# Patient Record
Sex: Female | Born: 1935 | Race: Black or African American | Hispanic: No | Smoking: Never smoker
Health system: Southern US, Community
[De-identification: ages and names within clinical notes are randomized; demographics above are authoritative.]

## PROBLEM LIST (undated history)

## (undated) DIAGNOSIS — I10 Essential (primary) hypertension: Secondary | ICD-10-CM

## (undated) DIAGNOSIS — R0602 Shortness of breath: Secondary | ICD-10-CM

## (undated) DIAGNOSIS — I2510 Atherosclerotic heart disease of native coronary artery without angina pectoris: Secondary | ICD-10-CM

## (undated) DIAGNOSIS — Z951 Presence of aortocoronary bypass graft: Secondary | ICD-10-CM

## (undated) DIAGNOSIS — M21619 Bunion of unspecified foot: Secondary | ICD-10-CM

## (undated) DIAGNOSIS — R9439 Abnormal result of other cardiovascular function study: Secondary | ICD-10-CM

## (undated) DIAGNOSIS — M1990 Unspecified osteoarthritis, unspecified site: Secondary | ICD-10-CM

## (undated) DIAGNOSIS — K219 Gastro-esophageal reflux disease without esophagitis: Secondary | ICD-10-CM

## (undated) DIAGNOSIS — Z5189 Encounter for other specified aftercare: Secondary | ICD-10-CM

## (undated) DIAGNOSIS — F419 Anxiety disorder, unspecified: Secondary | ICD-10-CM

## (undated) DIAGNOSIS — IMO0001 Reserved for inherently not codable concepts without codable children: Secondary | ICD-10-CM

## (undated) DIAGNOSIS — G709 Myoneural disorder, unspecified: Secondary | ICD-10-CM

## (undated) DIAGNOSIS — E785 Hyperlipidemia, unspecified: Secondary | ICD-10-CM

## (undated) HISTORY — PX: BACK SURGERY: SHX140

## (undated) HISTORY — PX: TOE SURGERY: SHX1073

## (undated) HISTORY — PX: EYE SURGERY: SHX253

## (undated) HISTORY — PX: ABDOMINAL HYSTERECTOMY: SHX81

## (undated) HISTORY — DX: Bunion of unspecified foot: M21.619

## (undated) HISTORY — PX: TUBAL LIGATION: SHX77

## (undated) HISTORY — DX: Hyperlipidemia, unspecified: E78.5

---

## 2008-02-23 ENCOUNTER — Ambulatory Visit (HOSPITAL_COMMUNITY): Admission: RE | Admit: 2008-02-23 | Discharge: 2008-02-23 | Payer: Self-pay | Admitting: Gynecology

## 2009-03-26 ENCOUNTER — Ambulatory Visit (HOSPITAL_COMMUNITY): Admission: RE | Admit: 2009-03-26 | Discharge: 2009-03-26 | Payer: Self-pay | Admitting: Gynecology

## 2009-04-03 ENCOUNTER — Encounter: Admission: RE | Admit: 2009-04-03 | Discharge: 2009-04-03 | Payer: Self-pay | Admitting: Gynecology

## 2009-09-13 ENCOUNTER — Encounter: Admission: RE | Admit: 2009-09-13 | Discharge: 2009-09-13 | Payer: Self-pay | Admitting: Gynecology

## 2010-05-09 ENCOUNTER — Other Ambulatory Visit: Payer: Self-pay | Admitting: Gynecology

## 2010-05-09 DIAGNOSIS — Z1231 Encounter for screening mammogram for malignant neoplasm of breast: Secondary | ICD-10-CM

## 2010-06-03 ENCOUNTER — Ambulatory Visit: Payer: Self-pay

## 2010-06-10 ENCOUNTER — Ambulatory Visit
Admission: RE | Admit: 2010-06-10 | Discharge: 2010-06-10 | Disposition: A | Discharge status: Home / Self Care | Payer: BC Managed Care – PPO | Source: Ambulatory Visit | Attending: Gynecology | Admitting: Gynecology

## 2010-06-10 DIAGNOSIS — Z1231 Encounter for screening mammogram for malignant neoplasm of breast: Secondary | ICD-10-CM

## 2010-06-11 ENCOUNTER — Other Ambulatory Visit: Payer: Self-pay | Admitting: Gynecology

## 2010-06-11 DIAGNOSIS — R928 Other abnormal and inconclusive findings on diagnostic imaging of breast: Secondary | ICD-10-CM

## 2010-06-18 ENCOUNTER — Other Ambulatory Visit: Payer: Self-pay | Admitting: Gynecology

## 2010-06-18 ENCOUNTER — Ambulatory Visit
Admission: RE | Admit: 2010-06-18 | Discharge: 2010-06-18 | Disposition: A | Discharge status: Home / Self Care | Payer: Medicare Other | Source: Ambulatory Visit | Attending: Gynecology | Admitting: Gynecology

## 2010-06-18 ENCOUNTER — Ambulatory Visit
Admission: RE | Admit: 2010-06-18 | Discharge: 2010-06-18 | Disposition: A | Discharge status: Home / Self Care | Payer: BC Managed Care – PPO | Source: Ambulatory Visit | Attending: Gynecology | Admitting: Gynecology

## 2010-06-18 DIAGNOSIS — R928 Other abnormal and inconclusive findings on diagnostic imaging of breast: Secondary | ICD-10-CM

## 2010-06-18 HISTORY — PX: BREAST CYST ASPIRATION: SHX578

## 2010-07-30 ENCOUNTER — Other Ambulatory Visit: Payer: Self-pay | Admitting: Internal Medicine

## 2010-07-30 DIAGNOSIS — R1011 Right upper quadrant pain: Secondary | ICD-10-CM

## 2010-08-01 ENCOUNTER — Ambulatory Visit
Admission: RE | Admit: 2010-08-01 | Discharge: 2010-08-01 | Disposition: A | Discharge status: Home / Self Care | Payer: BC Managed Care – PPO | Source: Ambulatory Visit | Attending: Internal Medicine | Admitting: Internal Medicine

## 2010-08-01 DIAGNOSIS — R1011 Right upper quadrant pain: Secondary | ICD-10-CM

## 2010-10-15 ENCOUNTER — Other Ambulatory Visit: Payer: Self-pay | Admitting: Internal Medicine

## 2010-10-15 DIAGNOSIS — M545 Low back pain, unspecified: Secondary | ICD-10-CM

## 2010-10-22 ENCOUNTER — Ambulatory Visit
Admission: RE | Admit: 2010-10-22 | Discharge: 2010-10-22 | Disposition: A | Discharge status: Home / Self Care | Payer: Medicare Other | Source: Ambulatory Visit | Attending: Internal Medicine | Admitting: Internal Medicine

## 2010-10-22 DIAGNOSIS — M545 Low back pain, unspecified: Secondary | ICD-10-CM

## 2010-10-28 ENCOUNTER — Other Ambulatory Visit: Payer: Self-pay | Admitting: Internal Medicine

## 2010-10-28 DIAGNOSIS — M549 Dorsalgia, unspecified: Secondary | ICD-10-CM

## 2010-10-31 ENCOUNTER — Ambulatory Visit
Admission: RE | Admit: 2010-10-31 | Discharge: 2010-10-31 | Disposition: A | Discharge status: Home / Self Care | Payer: BC Managed Care – PPO | Source: Ambulatory Visit | Attending: Internal Medicine | Admitting: Internal Medicine

## 2010-10-31 DIAGNOSIS — M549 Dorsalgia, unspecified: Secondary | ICD-10-CM

## 2011-02-06 ENCOUNTER — Other Ambulatory Visit: Payer: Self-pay | Admitting: Neurosurgery

## 2011-02-17 HISTORY — PX: FOOT SURGERY: SHX648

## 2011-02-24 ENCOUNTER — Encounter (HOSPITAL_COMMUNITY): Payer: Self-pay | Admitting: Pharmacy Technician

## 2011-02-25 ENCOUNTER — Encounter (HOSPITAL_COMMUNITY): Payer: Self-pay

## 2011-02-25 ENCOUNTER — Encounter (HOSPITAL_COMMUNITY)
Admission: RE | Admit: 2011-02-25 | Discharge: 2011-02-25 | Disposition: A | Discharge status: Home / Self Care | Payer: BC Managed Care – PPO | Source: Ambulatory Visit | Attending: Neurosurgery | Admitting: Neurosurgery

## 2011-02-25 ENCOUNTER — Encounter (HOSPITAL_COMMUNITY)
Admission: RE | Admit: 2011-02-25 | Discharge: 2011-02-25 | Disposition: A | Discharge status: Home / Self Care | Payer: BC Managed Care – PPO | Source: Ambulatory Visit | Attending: Anesthesiology | Admitting: Anesthesiology

## 2011-02-25 HISTORY — DX: Encounter for other specified aftercare: Z51.89

## 2011-02-25 HISTORY — DX: Unspecified osteoarthritis, unspecified site: M19.90

## 2011-02-25 HISTORY — DX: Essential (primary) hypertension: I10

## 2011-02-25 HISTORY — DX: Shortness of breath: R06.02

## 2011-02-25 HISTORY — DX: Myoneural disorder, unspecified: G70.9

## 2011-02-25 HISTORY — DX: Anxiety disorder, unspecified: F41.9

## 2011-02-25 HISTORY — DX: Reserved for inherently not codable concepts without codable children: IMO0001

## 2011-02-25 HISTORY — DX: Gastro-esophageal reflux disease without esophagitis: K21.9

## 2011-02-25 LAB — CBC
HCT: 38.9 % (ref 36.0–46.0)
Hemoglobin: 13.4 g/dL (ref 12.0–15.0)
MCH: 30.7 pg (ref 26.0–34.0)
MCHC: 34.4 g/dL (ref 30.0–36.0)
MCV: 89 fL (ref 78.0–100.0)
Platelets: 284 10*3/uL (ref 150–400)
RBC: 4.37 MIL/uL (ref 3.87–5.11)
RDW: 14.7 % (ref 11.5–15.5)
WBC: 6.5 10*3/uL (ref 4.0–10.5)

## 2011-02-25 LAB — SURGICAL PCR SCREEN
MRSA, PCR: NEGATIVE
Staphylococcus aureus: NEGATIVE

## 2011-02-25 LAB — BASIC METABOLIC PANEL
BUN: 17 mg/dL (ref 6–23)
CO2: 28 mEq/L (ref 19–32)
Calcium: 10.3 mg/dL (ref 8.4–10.5)
Chloride: 98 mEq/L (ref 96–112)
Creatinine, Ser: 0.82 mg/dL (ref 0.50–1.10)
GFR calc Af Amer: 79 mL/min — ABNORMAL LOW (ref >90)
GFR calc non Af Amer: 68 mL/min — ABNORMAL LOW (ref >90)
Glucose, Bld: 89 mg/dL (ref 70–99)
Potassium: 3.7 mEq/L (ref 3.5–5.1)
Sodium: 139 mEq/L (ref 135–145)

## 2011-02-25 NOTE — Progress Notes (Signed)
Requested via fax, ekg & ov note fr. [facility]. [facility].

## 2011-02-25 NOTE — Pre-Procedure Instructions (Signed)
22 Theresa Crosby  02/25/2011   Your procedure is scheduled on:  03/04/2011  Report to [facility] at 6:30 AM.  Call this number if you have problems the morning of surgery: [phone]   Remember:   Do not eat food:After Midnight.  May have clear liquids: up to 4 Hours before arrival.  Clear liquids include soda, tea, black coffee, apple or grape juice, broth.  Take these medicines the morning of surgery with A SIP OF WATER: norvasc,prilosec, use eye drops   Do not wear jewelry, make-up or nail polish.  Do not wear lotions, powders, or perfumes. You may wear deodorant.  Do not shave 48 hours prior to surgery.  Do not bring valuables to the hospital.  Contacts, dentures or bridgework may not be worn into surgery.  Leave suitcase in the car. After surgery it may be brought to your room.  For patients admitted to the hospital, checkout time is 11:00 AM the day of discharge.   Patients discharged the day of surgery will not be allowed to drive home.  Name and phone number of your driver:   Special Instructions: CHG Shower Use Special Wash: 1/2 bottle night before surgery and 1/2 bottle morning of surgery.   Please read over the following fact sheets that you were given: Pain Booklet, Coughing and Deep Breathing, MRSA Information and Surgical Site Infection Prevention

## 2011-03-03 MED ORDER — CEFAZOLIN SODIUM 1-5 GM-% IV SOLN
1.0000 g | INTRAVENOUS | Status: AC
  Administered 2011-03-04: 1 g via INTRAVENOUS
  Filled 2011-03-03 (×2): qty 50

## 2011-03-04 ENCOUNTER — Encounter (HOSPITAL_COMMUNITY): Payer: Self-pay | Admitting: Certified Registered"

## 2011-03-04 ENCOUNTER — Inpatient Hospital Stay (HOSPITAL_COMMUNITY)
Admission: RE | Admit: 2011-03-04 | Discharge: 2011-03-05 | DRG: 758 | Disposition: A | Discharge status: Home / Self Care | Payer: BC Managed Care – PPO | Source: Ambulatory Visit | Attending: Neurosurgery | Admitting: Neurosurgery

## 2011-03-04 ENCOUNTER — Ambulatory Visit (HOSPITAL_COMMUNITY): Payer: BC Managed Care – PPO | Admitting: Certified Registered"

## 2011-03-04 ENCOUNTER — Ambulatory Visit (HOSPITAL_COMMUNITY): Payer: BC Managed Care – PPO

## 2011-03-04 ENCOUNTER — Encounter (HOSPITAL_COMMUNITY)
Admission: RE | Disposition: A | Discharge status: Home / Self Care | Payer: Self-pay | Source: Ambulatory Visit | Attending: Neurosurgery

## 2011-03-04 ENCOUNTER — Encounter (HOSPITAL_COMMUNITY): Payer: Self-pay | Admitting: *Deleted

## 2011-03-04 DIAGNOSIS — M1990 Unspecified osteoarthritis, unspecified site: Secondary | ICD-10-CM | POA: Diagnosis present

## 2011-03-04 DIAGNOSIS — K219 Gastro-esophageal reflux disease without esophagitis: Secondary | ICD-10-CM | POA: Diagnosis present

## 2011-03-04 DIAGNOSIS — Z01818 Encounter for other preprocedural examination: Secondary | ICD-10-CM

## 2011-03-04 DIAGNOSIS — I10 Essential (primary) hypertension: Secondary | ICD-10-CM | POA: Diagnosis present

## 2011-03-04 DIAGNOSIS — Z79899 Other long term (current) drug therapy: Secondary | ICD-10-CM

## 2011-03-04 DIAGNOSIS — IMO0002 Reserved for concepts with insufficient information to code with codable children: Secondary | ICD-10-CM | POA: Diagnosis present

## 2011-03-04 DIAGNOSIS — M4714 Other spondylosis with myelopathy, thoracic region: Principal | ICD-10-CM | POA: Diagnosis present

## 2011-03-04 DIAGNOSIS — F411 Generalized anxiety disorder: Secondary | ICD-10-CM | POA: Diagnosis present

## 2011-03-04 DIAGNOSIS — Z01812 Encounter for preprocedural laboratory examination: Secondary | ICD-10-CM

## 2011-03-04 HISTORY — PX: LUMBAR LAMINECTOMY/DECOMPRESSION MICRODISCECTOMY: SHX5026

## 2011-03-04 SURGERY — LUMBAR LAMINECTOMY/DECOMPRESSION MICRODISCECTOMY
Anesthesia: General | Site: Spine Thoracic | Wound class: Clean

## 2011-03-04 MED ORDER — HEMOSTATIC AGENTS (NO CHARGE) OPTIME
TOPICAL | Status: DC | PRN
  Administered 2011-03-04: 1 via TOPICAL

## 2011-03-04 MED ORDER — HYDROMORPHONE HCL PF 1 MG/ML IJ SOLN
0.2500 mg | INTRAMUSCULAR | Status: DC | PRN
  Administered 2011-03-04: 0.25 mg via INTRAVENOUS

## 2011-03-04 MED ORDER — DROPERIDOL 2.5 MG/ML IJ SOLN: 0.6250 mg | INTRAMUSCULAR | Status: DC | PRN

## 2011-03-04 MED ORDER — BACITRACIN 50000 UNITS IM SOLR
INTRAMUSCULAR | Status: AC
  Filled 2011-03-04: qty 1

## 2011-03-04 MED ORDER — ZOLPIDEM TARTRATE 5 MG PO TABS: 5.0000 mg | ORAL_TABLET | Freq: Every evening | ORAL | Status: DC | PRN

## 2011-03-04 MED ORDER — ONDANSETRON HCL 4 MG/2ML IJ SOLN
INTRAMUSCULAR | Status: DC | PRN
  Administered 2011-03-04: 4 mg via INTRAVENOUS

## 2011-03-04 MED ORDER — BUPIVACAINE HCL (PF) 0.5 % IJ SOLN
INTRAMUSCULAR | Status: DC | PRN
  Administered 2011-03-04: 20 mL

## 2011-03-04 MED ORDER — LIDOCAINE-EPINEPHRINE 1 %-1:100000 IJ SOLN
INTRAMUSCULAR | Status: DC | PRN
  Administered 2011-03-04: 20 mL

## 2011-03-04 MED ORDER — SODIUM CHLORIDE 0.9 % IR SOLN
Status: DC | PRN
  Administered 2011-03-04: 10:00:00

## 2011-03-04 MED ORDER — KCL IN DEXTROSE-NACL 20-5-0.45 MEQ/L-%-% IV SOLN
INTRAVENOUS | Status: DC
  Administered 2011-03-04: 14:00:00 via INTRAVENOUS
  Filled 2011-03-04 (×6): qty 1000

## 2011-03-04 MED ORDER — ACETAMINOPHEN 325 MG PO TABS: 650.0000 mg | ORAL_TABLET | ORAL | Status: DC | PRN

## 2011-03-04 MED ORDER — MENTHOL 3 MG MT LOZG: 1.0000 | LOZENGE | OROMUCOSAL | Status: DC | PRN

## 2011-03-04 MED ORDER — PHENOL 1.4 % MT LIQD: 1.0000 | OROMUCOSAL | Status: DC | PRN

## 2011-03-04 MED ORDER — PROPOFOL 10 MG/ML IV EMUL
INTRAVENOUS | Status: DC | PRN
  Administered 2011-03-04: 150 mg via INTRAVENOUS

## 2011-03-04 MED ORDER — MORPHINE SULFATE 4 MG/ML IJ SOLN: 4.0000 mg | INTRAMUSCULAR | Status: DC | PRN

## 2011-03-04 MED ORDER — SODIUM CHLORIDE 0.9 % IJ SOLN
3.0000 mL | Freq: Two times a day (BID) | INTRAMUSCULAR | Status: DC
  Administered 2011-03-04 – 2011-03-05 (×4): 3 mL via INTRAVENOUS

## 2011-03-04 MED ORDER — BRIMONIDINE TARTRATE 0.2 % OP SOLN
1.0000 [drp] | Freq: Two times a day (BID) | OPHTHALMIC | Status: DC
  Filled 2011-03-04: qty 5

## 2011-03-04 MED ORDER — SODIUM CHLORIDE 0.9 % IJ SOLN: 3.0000 mL | INTRAMUSCULAR | Status: DC | PRN

## 2011-03-04 MED ORDER — THROMBIN 5000 UNITS EX SOLR
CUTANEOUS | Status: DC | PRN
  Administered 2011-03-04 (×2): 5000 [IU] via TOPICAL

## 2011-03-04 MED ORDER — OLMESARTAN MEDOXOMIL 40 MG PO TABS
40.0000 mg | ORAL_TABLET | Freq: Every day | ORAL | Status: DC
  Administered 2011-03-04: 40 mg via ORAL
  Filled 2011-03-04 (×2): qty 1

## 2011-03-04 MED ORDER — KETOROLAC TROMETHAMINE 30 MG/ML IJ SOLN
INTRAMUSCULAR | Status: AC
  Administered 2011-03-04: 30 mg via INTRAVENOUS
  Filled 2011-03-04: qty 1

## 2011-03-04 MED ORDER — BISACODYL 10 MG RE SUPP: 10.0000 mg | Freq: Every day | RECTAL | Status: DC | PRN

## 2011-03-04 MED ORDER — CYCLOBENZAPRINE HCL 10 MG PO TABS: 10.0000 mg | ORAL_TABLET | Freq: Three times a day (TID) | ORAL | Status: DC | PRN

## 2011-03-04 MED ORDER — ROCURONIUM BROMIDE 100 MG/10ML IV SOLN
INTRAVENOUS | Status: DC | PRN
  Administered 2011-03-04: 50 mg via INTRAVENOUS

## 2011-03-04 MED ORDER — HYDROXYZINE HCL 50 MG/ML IM SOLN: 50.0000 mg | INTRAMUSCULAR | Status: DC | PRN

## 2011-03-04 MED ORDER — SODIUM CHLORIDE 0.9 % IV SOLN
INTRAVENOUS | Status: AC
  Filled 2011-03-04: qty 500

## 2011-03-04 MED ORDER — LACTATED RINGERS IV SOLN
INTRAVENOUS | Status: DC | PRN
  Administered 2011-03-04 (×2): via INTRAVENOUS

## 2011-03-04 MED ORDER — VECURONIUM BROMIDE 10 MG IV SOLR
INTRAVENOUS | Status: DC | PRN
  Administered 2011-03-04 (×2): 2 mg via INTRAVENOUS

## 2011-03-04 MED ORDER — HYDROCODONE-ACETAMINOPHEN 5-325 MG PO TABS
1.0000 | ORAL_TABLET | ORAL | Status: DC | PRN
  Administered 2011-03-04: 1 via ORAL
  Administered 2011-03-04: 2 via ORAL
  Administered 2011-03-04: 1 via ORAL
  Administered 2011-03-05 (×2): 2 via ORAL
  Filled 2011-03-04: qty 1
  Filled 2011-03-04 (×4): qty 2

## 2011-03-04 MED ORDER — HYDROMORPHONE HCL PF 1 MG/ML IJ SOLN
INTRAMUSCULAR | Status: AC
  Filled 2011-03-04: qty 1

## 2011-03-04 MED ORDER — FENTANYL CITRATE 0.05 MG/ML IJ SOLN
INTRAMUSCULAR | Status: DC | PRN
  Administered 2011-03-04: 50 ug via INTRAVENOUS
  Administered 2011-03-04: 25 ug via INTRAVENOUS
  Administered 2011-03-04 (×2): 50 ug via INTRAVENOUS

## 2011-03-04 MED ORDER — OXYCODONE-ACETAMINOPHEN 5-325 MG PO TABS: 1.0000 | ORAL_TABLET | ORAL | Status: DC | PRN

## 2011-03-04 MED ORDER — PHENYLEPHRINE HCL 10 MG/ML IJ SOLN
INTRAMUSCULAR | Status: DC | PRN
  Administered 2011-03-04 (×4): 40 ug via INTRAVENOUS

## 2011-03-04 MED ORDER — ACETAMINOPHEN 650 MG RE SUPP: 650.0000 mg | RECTAL | Status: DC | PRN

## 2011-03-04 MED ORDER — OLMESARTAN MEDOXOMIL-HCTZ 40-25 MG PO TABS: 1.0000 | ORAL_TABLET | Freq: Every day | ORAL | Status: DC

## 2011-03-04 MED ORDER — GLYCOPYRROLATE 0.2 MG/ML IJ SOLN
INTRAMUSCULAR | Status: DC | PRN
  Administered 2011-03-04 (×2): 0.2 mg via INTRAVENOUS

## 2011-03-04 MED ORDER — HYDROCHLOROTHIAZIDE 25 MG PO TABS
25.0000 mg | ORAL_TABLET | Freq: Every day | ORAL | Status: DC
  Administered 2011-03-04: 25 mg via ORAL
  Filled 2011-03-04 (×2): qty 1

## 2011-03-04 MED ORDER — AMLODIPINE BESYLATE 5 MG PO TABS
5.0000 mg | ORAL_TABLET | Freq: Every day | ORAL | Status: DC
  Filled 2011-03-04 (×2): qty 1

## 2011-03-04 MED ORDER — 0.9 % SODIUM CHLORIDE (POUR BTL) OPTIME
TOPICAL | Status: DC | PRN
  Administered 2011-03-04: 1000 mL

## 2011-03-04 MED ORDER — ESTROGENS, CONJUGATED 0.625 MG/GM VA CREA
0.5000 | TOPICAL_CREAM | VAGINAL | Status: DC
  Filled 2011-03-04: qty 42.5

## 2011-03-04 MED ORDER — DEXAMETHASONE SODIUM PHOSPHATE 4 MG/ML IJ SOLN
INTRAMUSCULAR | Status: DC | PRN
  Administered 2011-03-04: 4 mg via INTRAVENOUS

## 2011-03-04 MED ORDER — KETOROLAC TROMETHAMINE 30 MG/ML IJ SOLN
30.0000 mg | Freq: Once | INTRAMUSCULAR | Status: AC
  Administered 2011-03-04: 30 mg via INTRAVENOUS

## 2011-03-04 MED ORDER — KETOROLAC TROMETHAMINE 30 MG/ML IJ SOLN
30.0000 mg | Freq: Four times a day (QID) | INTRAMUSCULAR | Status: DC
  Administered 2011-03-04 – 2011-03-05 (×4): 30 mg via INTRAVENOUS
  Filled 2011-03-04 (×7): qty 1

## 2011-03-04 MED ORDER — ALUM & MAG HYDROXIDE-SIMETH 200-200-20 MG/5ML PO SUSP: 30.0000 mL | Freq: Four times a day (QID) | ORAL | Status: DC | PRN

## 2011-03-04 MED ORDER — LATANOPROST 0.005 % OP SOLN
1.0000 [drp] | Freq: Every day | OPHTHALMIC | Status: DC
  Filled 2011-03-04: qty 2.5

## 2011-03-04 MED ORDER — PHENYLEPHRINE HCL 10 MG/ML IJ SOLN
10.0000 mg | INTRAVENOUS | Status: DC | PRN
  Administered 2011-03-04: 50 ug/min via INTRAVENOUS
  Administered 2011-03-04: 10 ug/min via INTRAVENOUS

## 2011-03-04 MED ORDER — PANTOPRAZOLE SODIUM 40 MG PO TBEC: 40.0000 mg | DELAYED_RELEASE_TABLET | Freq: Every day | ORAL | Status: DC

## 2011-03-04 MED ORDER — NEOSTIGMINE METHYLSULFATE 1 MG/ML IJ SOLN
INTRAMUSCULAR | Status: DC | PRN
  Administered 2011-03-04: 3 mg via INTRAVENOUS

## 2011-03-04 MED ORDER — MIDAZOLAM HCL 5 MG/5ML IJ SOLN
INTRAMUSCULAR | Status: DC | PRN
  Administered 2011-03-04: 2 mg via INTRAVENOUS

## 2011-03-04 MED ORDER — ESTRADIOL 0.05 MG/24HR TD PTWK
0.0500 mg | MEDICATED_PATCH | TRANSDERMAL | Status: DC
  Filled 2011-03-04: qty 1

## 2011-03-04 MED ORDER — MAGNESIUM HYDROXIDE 400 MG/5ML PO SUSP: 30.0000 mL | Freq: Every day | ORAL | Status: DC | PRN

## 2011-03-04 MED ORDER — HYDROXYZINE HCL 25 MG PO TABS: 50.0000 mg | ORAL_TABLET | ORAL | Status: DC | PRN

## 2011-03-04 SURGICAL SUPPLY — 57 items
BAG DECANTER FOR FLEXI CONT (MISCELLANEOUS) ×2 IMPLANT
BENZOIN TINCTURE PRP APPL 2/3 (GAUZE/BANDAGES/DRESSINGS) IMPLANT
BLADE SURG ROTATE 9660 (MISCELLANEOUS) IMPLANT
BRUSH SCRUB EZ PLAIN DRY (MISCELLANEOUS) ×2 IMPLANT
BUR ACORN 6.0 ACORN (BURR) IMPLANT
BUR ACRON 5.0MM COATED (BURR) ×2 IMPLANT
BUR MATCHSTICK NEURO 3.0 LAGG (BURR) ×2 IMPLANT
CANISTER SUCTION 2500CC (MISCELLANEOUS) ×2 IMPLANT
CLOTH BEACON ORANGE TIMEOUT ST (SAFETY) ×2 IMPLANT
CONT SPEC 4OZ CLIKSEAL STRL BL (MISCELLANEOUS) ×2 IMPLANT
DERMABOND ADVANCED (GAUZE/BANDAGES/DRESSINGS) ×3
DERMABOND ADVANCED .7 DNX12 (GAUZE/BANDAGES/DRESSINGS) ×3 IMPLANT
DRAPE LAPAROTOMY 100X72X124 (DRAPES) ×2 IMPLANT
DRAPE MICROSCOPE LEICA (MISCELLANEOUS) IMPLANT
DRAPE POUCH INSTRU U-SHP 10X18 (DRAPES) ×2 IMPLANT
DRSG EMULSION OIL 3X3 NADH (GAUZE/BANDAGES/DRESSINGS) IMPLANT
ELECT CAUTERY BLADE 6.4 (BLADE) ×2 IMPLANT
ELECT REM PT RETURN 9FT ADLT (ELECTROSURGICAL) ×2
ELECTRODE REM PT RTRN 9FT ADLT (ELECTROSURGICAL) ×1 IMPLANT
GAUZE SPONGE 4X4 16PLY XRAY LF (GAUZE/BANDAGES/DRESSINGS) IMPLANT
GLOVE BIOGEL PI IND STRL 8 (GLOVE) ×2 IMPLANT
GLOVE BIOGEL PI INDICATOR 8 (GLOVE) ×2
GLOVE ECLIPSE 7.5 STRL STRAW (GLOVE) ×6 IMPLANT
GLOVE EXAM NITRILE LRG STRL (GLOVE) IMPLANT
GLOVE EXAM NITRILE MD LF STRL (GLOVE) ×2 IMPLANT
GLOVE EXAM NITRILE XL STR (GLOVE) IMPLANT
GLOVE EXAM NITRILE XS STR PU (GLOVE) IMPLANT
GOWN BRE IMP SLV AUR LG STRL (GOWN DISPOSABLE) ×2 IMPLANT
GOWN BRE IMP SLV AUR XL STRL (GOWN DISPOSABLE) ×2 IMPLANT
GOWN STRL REIN 2XL LVL4 (GOWN DISPOSABLE) ×2 IMPLANT
KIT BASIN OR (CUSTOM PROCEDURE TRAY) ×2 IMPLANT
KIT ROOM TURNOVER OR (KITS) ×2 IMPLANT
NEEDLE HYPO 18GX1.5 BLUNT FILL (NEEDLE) IMPLANT
NEEDLE HYPO 22GX1.5 SAFETY (NEEDLE) IMPLANT
NEEDLE SPNL 18GX3.5 QUINCKE PK (NEEDLE) ×4 IMPLANT
NEEDLE SPNL 22GX3.5 QUINCKE BK (NEEDLE) ×4 IMPLANT
NS IRRIG 1000ML POUR BTL (IV SOLUTION) ×2 IMPLANT
PACK LAMINECTOMY NEURO (CUSTOM PROCEDURE TRAY) ×2 IMPLANT
PAD ARMBOARD 7.5X6 YLW CONV (MISCELLANEOUS) ×12 IMPLANT
PATTIES SURGICAL .5 X1 (DISPOSABLE) IMPLANT
PENCIL BUTTON HOLSTER BLD 10FT (ELECTRODE) ×2 IMPLANT
RUBBERBAND STERILE (MISCELLANEOUS) IMPLANT
SPONGE GAUZE 4X4 12PLY (GAUZE/BANDAGES/DRESSINGS) ×2 IMPLANT
SPONGE LAP 4X18 X RAY DECT (DISPOSABLE) IMPLANT
SPONGE SURGIFOAM ABS GEL SZ50 (HEMOSTASIS) ×2 IMPLANT
STRIP CLOSURE SKIN 1/2X4 (GAUZE/BANDAGES/DRESSINGS) IMPLANT
SUT PROLENE 6 0 BV (SUTURE) IMPLANT
SUT VIC AB 1 CT1 18XBRD ANBCTR (SUTURE) ×2 IMPLANT
SUT VIC AB 1 CT1 8-18 (SUTURE) ×2
SUT VIC AB 2-0 CP2 18 (SUTURE) ×4 IMPLANT
SUT VIC AB 3-0 SH 8-18 (SUTURE) ×2 IMPLANT
SYR 20CC LL (SYRINGE) ×2 IMPLANT
SYR 5ML LL (SYRINGE) IMPLANT
TAPE CLOTH SURG 4X10 WHT LF (GAUZE/BANDAGES/DRESSINGS) ×2 IMPLANT
TOWEL OR 17X24 6PK STRL BLUE (TOWEL DISPOSABLE) ×2 IMPLANT
TOWEL OR 17X26 10 PK STRL BLUE (TOWEL DISPOSABLE) ×2 IMPLANT
WATER STERILE IRR 1000ML POUR (IV SOLUTION) ×2 IMPLANT

## 2011-03-04 NOTE — Transfer of Care (Signed)
Immediate Anesthesia Transfer of Care Note  Patient: Theresa Crosby  Procedure(s) Performed:  LUMBAR LAMINECTOMY/DECOMPRESSION MICRODISCECTOMY - Thoracic Ten-Thoracic Twelve Thoracic Laminectomy  Patient Location: PACU  Anesthesia Type: General  Level of Consciousness: awake  Airway & Oxygen Therapy: Patient Spontanous Breathing  Post-op Assessment: Report given to PACU RN  Post vital signs: stable Filed Vitals:   03/04/11 1030  BP:   Pulse:   Temp: 36.2 C  Resp:     Complications: No apparent anesthesia complications

## 2011-03-04 NOTE — Plan of Care (Signed)
Problem: Consults Goal: Diagnosis - Spinal Surgery Outcome: Completed/Met Date Met:  03/04/11 Lumbar Laminectomy (Complex)     

## 2011-03-04 NOTE — Anesthesia Preprocedure Evaluation (Addendum)
Anesthesia Evaluation  Patient identified by MRN, date of birth, ID band Patient awake    Reviewed: Allergy & Precautions, H&P , NPO status , Patient's Chart, lab work & pertinent test results  Airway Mallampati: II TM Distance: <3 FB Neck ROM: Full    Dental  (+) Partial Upper, Teeth Intact and Partial Lower   Pulmonary shortness of breath and with exertion,  clear to auscultation  Pulmonary exam normal       Cardiovascular hypertension, Pt. on medications - CAD Regular Normal- Systolic murmurs    Neuro/Psych Anxiety Depression  Neuromuscular disease    GI/Hepatic Neg liver ROS, GERD-  Medicated,  Endo/Other  Negative Endocrine ROS  Renal/GU negative Renal ROS     Musculoskeletal   Abdominal (+)  Abdomen: soft. Bowel sounds: normal.  Peds  Hematology   Anesthesia Other Findings   Reproductive/Obstetrics                         Anesthesia Physical Anesthesia Plan  ASA: III  Anesthesia Plan: General   Post-op Pain Management:    Induction: Intravenous  Airway Management Planned: Oral ETT  Additional Equipment:   Intra-op Plan:   Post-operative Plan: Extubation in OR  Informed Consent: I have reviewed the patients History and Physical, chart, labs and discussed the procedure including the risks, benefits and alternatives for the proposed anesthesia with the patient or authorized representative who has indicated his/her understanding and acceptance.   Dental advisory given  Plan Discussed with: CRNA, Anesthesiologist and Surgeon  Anesthesia Plan Comments:         Anesthesia Quick Evaluation

## 2011-03-04 NOTE — Progress Notes (Signed)
Filed Vitals:   03/04/11 1130 03/04/11 1145 03/04/11 1152 03/04/11 1200  BP: 142/71 122/69  142/75  Pulse: 53 51 56 69  Temp:   97 F (36.1 C) 97.4 F (36.3 C)  TempSrc:      Resp: 13 14 14 16   SpO2: 100% 100% 100% 98%     patient resting in bed comfortably. She has been out of bed to the bathroom and void without difficulty. Moving all extremities well. Dressing is clean and dry.   Plan: Have encouraged patient to be ambulating in the hall several times today, and again in the morning.

## 2011-03-04 NOTE — Progress Notes (Signed)
Subjective: Patient is a 78 y.o. female who is admitted for treatment of thoracic stenosis with myelopathy.  Patient's difficulties began in April 2012 which developed bladder incontinence, last summer she developed increasing difficulty with pain through her lower extremities, right worse than left. She developed a slowed gait and difficulty with control of her left lower extremity  Patient was evaluated with MRI scan that showed spondylosis, spinal disc protrusion, and canal stenosis at T11-T12 level, with increased signal within the spinal cord at T11-T12 level. Lumbar MRI showed multilevel multifactorial lumbar stenosis, moderate to marked at L2-3, marked to severe at L3-4.  Patient is admitted now for a T10-T12 thoracic laminectomy for decompression of thoracic stenosis.  Past Medical History  Diagnosis Date  . Hypertension     ireg. heartbeat, no referral made to cardiologist , sees [facility].   . Shortness of breath   . Blood transfusion     postop- back surg. 1987  . GERD (gastroesophageal reflux disease)   . Arthritis     degeneration - spondylosis, OA - all over body,   . Neuromuscular disorder     burning- L leg , numbing of toes   . Anxiety     2005- related to care of aging parent     Past Surgical History  Procedure Date  . Back surgery     1987-   . Eye surgery     cataracts removed- bilateral, no IOL  . Abdominal hysterectomy     partial hysterectomy- 1972  . Tubal ligation   . Toe surgery     R foot- removed calus between 5-4    Prescriptions prior to admission  Medication Sig Dispense Refill  . amLODipine (NORVASC) 5 MG tablet Take 5 mg by mouth daily.        . brimonidine (ALPHAGAN P) 0.1 % SOLN Place 1 drop into both eyes 2 (two) times daily.        . Calcium-Vitamin D-Vitamin K 500-100-40 MG-UNT-MCG CHEW Chew 1 tablet by mouth 2 (two) times daily.        . Cholecalciferol (VITAMIN D3) 1000 UNITS CAPS Take 2,000 Units by mouth every morning.        .  latanoprost (XALATAN) 0.005 % ophthalmic solution Place 1 drop into both eyes at bedtime.        . lidocaine (LIDODERM) 5 % Place 1 patch onto the skin daily as needed. Remove & Discard patch within 12 hours or as directed by MD for pain      . naproxen sodium (ANAPROX) 220 MG tablet Take 440 mg by mouth 2 (two) times daily with a meal. For pain      . olmesartan-hydrochlorothiazide (BENICAR HCT) 40-25 MG per tablet Take 1 tablet by mouth daily.        . Omeprazole (PRILOSEC PO) Take 1 capsule by mouth daily.        Marland Kitchen PRESCRIPTION MEDICATION Place 1 drop into both eyes 3 (three) times daily. fresh kote eye drops      . conjugated estrogens (PREMARIN) vaginal cream Place 0.5 g vaginally 2 (two) times a week. Thursday & sunday       . estradiol (VIVELLE-DOT) 0.0375 MG/24HR Place 1 patch onto the skin 2 (two) times a week. Thursday & Sunday        Allergies  Allergen Reactions  . Lactose Intolerance (Gi)     History  Substance Use Topics  . Smoking status: Never Smoker   . Smokeless tobacco:  Not on file  . Alcohol Use: No    Family History  Problem Relation Age of Onset  . Anesthesia problems Neg Hx   . Hypotension Neg Hx   . Malignant hyperthermia Neg Hx   . Pseudochol deficiency Neg Hx      Review of Systems A comprehensive review of systems was negative.  Objective: Vital signs in last 24 hours: Temp:  [98 F (36.7 C)] 98 F (36.7 C) (01/16 0651) Pulse Rate:  [83] 83  (01/16 0651) Resp:  [20] 20  (01/16 0651) BP: (122)/(80) 122/80 mmHg (01/16 0651) SpO2:  [100 %] 100 % (01/16 0651)  EXAM:  Lungs are clear to auscultation , the patient has symmetrical respiratory excursion. Heart has a regular rate and rhythm normal S1 and S2 no murmur.   Abdomen is soft nontender nondistended bowel sounds are present. Extremity examination shows no clubbing cyanosis or edema. Musculoskeletal examination shows some tenderness to palpation in the lower thoracic region, a dentist a patient over  the upper and mid thoracic region, no tenderness over the lumbar region. She had a flexion the low back to 90 able to extend to 10. Straight leg raising is negative bilaterally.  Motor examination shows 5 over 5 strength in the lower extremities including the iliopsoas quadriceps dorsiflexor extensor hallicus  longus and plantar flexor bilaterally. Sensation is intact to pinprick in the distal lower extremities. Reflexes biceps and brachialis or 2 bilaterally, left triceps is minimal, right triceps is 2, quadriceps 3 bilaterally, the question is are minimal bilaterally, the toes are equivocal bilaterally. Patient has a normal gait and stance.    Data Review:CBC    Component Value Date/Time   WBC 6.5 02/25/2011 1338   RBC 4.37 02/25/2011 1338   HGB 13.4 02/25/2011 1338   HCT 38.9 02/25/2011 1338   PLT 284 02/25/2011 1338   MCV 89.0 02/25/2011 1338   MCH 30.7 02/25/2011 1338   MCHC 34.4 02/25/2011 1338   RDW 14.7 02/25/2011 1338                          BMET    Component Value Date/Time   NA 139 02/25/2011 1338   K 3.7 02/25/2011 1338   CL 98 02/25/2011 1338   CO2 28 02/25/2011 1338   GLUCOSE 89 02/25/2011 1338   BUN 17 02/25/2011 1338   CREATININE 0.82 02/25/2011 1338   CALCIUM 10.3 02/25/2011 1338   GFRNONAA 68* 02/25/2011 1338   GFRAA 79* 02/25/2011 1338     Assessment/Plan: Patient with thoracic stenosis with myelopathy secondary to spondylosis and spondylitic disc protrusion. She is admitted now for T10-T12 thoracic laminectomy. I've discussed with the patient the nature of her condition, the nature of the surgical procedure, typical length of surgery, hospital stay, and overall recuperation. We discussed her limitations postoperatively and risks of surgery including risks of infection, bleeding, possible need for transfusion, the risk of nerve root dysfunction with pain, weakness, numbness, or paresthesias, risk of spinal cord dysfunction with paralysis of both lower extremities, and bowel and bladder function,  and anesthetic risks of infarction, stroke, pneumonia, and death.   Hewitt Shorts, MD 03/04/2011 8:15 AM

## 2011-03-04 NOTE — H&P (Signed)
Subjective: Patient is a 78 y.o. female who is admitted for treatment of thoracic stenosis with myelopathy.  Patient's difficulties began in April 2012 which developed bladder incontinence, last summer she developed increasing difficulty with pain through her lower extremities, right worse than left. She developed a slowed gait and difficulty with control of her left lower extremity  Patient was evaluated with MRI scan that showed spondylosis, spinal disc protrusion, and canal stenosis at T11-T12 level, with increased signal within the spinal cord at T11-T12 level. Lumbar MRI showed multilevel multifactorial lumbar stenosis, moderate to marked at L2-3, marked to severe at L3-4.  Patient is admitted now for a T10-T12 thoracic laminectomy for decompression of thoracic stenosis.  Past Medical History  Diagnosis Date  . Hypertension     ireg. heartbeat, no referral made to cardiologist , sees [facility]   . Shortness of breath   . Blood transfusion     postop- back surg. 1987  . GERD (gastroesophageal reflux disease)   . Arthritis     degeneration - spondylosis, OA - all over body,   . Neuromuscular disorder     burning- L leg , numbing of toes   . Anxiety     2005- related to care of aging parent     Past Surgical History  Procedure Date  . Back surgery     1987-   . Eye surgery     cataracts removed- bilateral, no IOL  . Abdominal hysterectomy     partial hysterectomy- 1972  . Tubal ligation   . Toe surgery     R foot- removed calus between 5-4    Prescriptions prior to admission  Medication Sig Dispense Refill  . amLODipine (NORVASC) 5 MG tablet Take 5 mg by mouth daily.        . brimonidine (ALPHAGAN P) 0.1 % SOLN Place 1 drop into both eyes 2 (two) times daily.        . Calcium-Vitamin D-Vitamin K 500-100-40 MG-UNT-MCG CHEW Chew 1 tablet by mouth 2 (two) times daily.        . Cholecalciferol (VITAMIN D3) 1000 UNITS CAPS Take 2,000 Units by mouth every morning.        .  latanoprost (XALATAN) 0.005 % ophthalmic solution Place 1 drop into both eyes at bedtime.        . lidocaine (LIDODERM) 5 % Place 1 patch onto the skin daily as needed. Remove & Discard patch within 12 hours or as directed by MD for pain      . naproxen sodium (ANAPROX) 220 MG tablet Take 440 mg by mouth 2 (two) times daily with a meal. For pain      . olmesartan-hydrochlorothiazide (BENICAR HCT) 40-25 MG per tablet Take 1 tablet by mouth daily.        . Omeprazole (PRILOSEC PO) Take 1 capsule by mouth daily.        . PRESCRIPTION MEDICATION Place 1 drop into both eyes 3 (three) times daily. fresh kote eye drops      . conjugated estrogens (PREMARIN) vaginal cream Place 0.5 g vaginally 2 (two) times a week. Thursday & sunday       . estradiol (VIVELLE-DOT) 0.0375 MG/24HR Place 1 patch onto the skin 2 (two) times a week. Thursday & Sunday        Allergies  Allergen Reactions  . Lactose Intolerance (Gi)     History  Substance Use Topics  . Smoking status: Never Smoker   . Smokeless tobacco:   Not on file  . Alcohol Use: No    Family History  Problem Relation Age of Onset  . Anesthesia problems Neg Hx   . Hypotension Neg Hx   . Malignant hyperthermia Neg Hx   . Pseudochol deficiency Neg Hx      Review of Systems A comprehensive review of systems was negative.  Objective: Vital signs in last 24 hours: Temp:  [98 F (36.7 C)] 98 F (36.7 C) (01/16 0651) Pulse Rate:  [83] 83  (01/16 0651) Resp:  [20] 20  (01/16 0651) BP: (122)/(80) 122/80 mmHg (01/16 0651) SpO2:  [100 %] 100 % (01/16 0651)  EXAM:  Lungs are clear to auscultation , the patient has symmetrical respiratory excursion. Heart has a regular rate and rhythm normal S1 and S2 no murmur.   Abdomen is soft nontender nondistended bowel sounds are present. Extremity examination shows no clubbing cyanosis or edema. Musculoskeletal examination shows some tenderness to palpation in the lower thoracic region, a dentist a patient over  the upper and mid thoracic region, no tenderness over the lumbar region. She had a flexion the low back to 90 able to extend to 10. Straight leg raising is negative bilaterally.  Motor examination shows 5 over 5 strength in the lower extremities including the iliopsoas quadriceps dorsiflexor extensor hallicus  longus and plantar flexor bilaterally. Sensation is intact to pinprick in the distal lower extremities. Reflexes biceps and brachialis or 2 bilaterally, left triceps is minimal, right triceps is 2, quadriceps 3 bilaterally, the question is are minimal bilaterally, the toes are equivocal bilaterally. Patient has a normal gait and stance.    Data Review:CBC    Component Value Date/Time   WBC 6.5 02/25/2011 1338   RBC 4.37 02/25/2011 1338   HGB 13.4 02/25/2011 1338   HCT 38.9 02/25/2011 1338   PLT 284 02/25/2011 1338   MCV 89.0 02/25/2011 1338   MCH 30.7 02/25/2011 1338   MCHC 34.4 02/25/2011 1338   RDW 14.7 02/25/2011 1338                          BMET    Component Value Date/Time   NA 139 02/25/2011 1338   K 3.7 02/25/2011 1338   CL 98 02/25/2011 1338   CO2 28 02/25/2011 1338   GLUCOSE 89 02/25/2011 1338   BUN 17 02/25/2011 1338   CREATININE 0.82 02/25/2011 1338   CALCIUM 10.3 02/25/2011 1338   GFRNONAA 68* 02/25/2011 1338   GFRAA 79* 02/25/2011 1338     Assessment/Plan: Patient with thoracic stenosis with myelopathy secondary to spondylosis and spondylitic disc protrusion. She is admitted now for T10-T12 thoracic laminectomy. I've discussed with the patient the nature of her condition, the nature of the surgical procedure, typical length of surgery, hospital stay, and overall recuperation. We discussed her limitations postoperatively and risks of surgery including risks of infection, bleeding, possible need for transfusion, the risk of nerve root dysfunction with pain, weakness, numbness, or paresthesias, risk of spinal cord dysfunction with paralysis of both lower extremities, and bowel and bladder function,  and anesthetic risks of infarction, stroke, pneumonia, and death.   NUDELMAN,ROBERT W, MD 03/04/2011 8:15 AM    NA  139  02/25/2011 1338     K  3.7  02/25/2011 1338     CL  98  02/25/2011 1338     CO2  28  02/25/2011 1338     GLUCOSE  89  02/25/2011 1338     BUN  17  02/25/2011 1338     CREATININE  0.82  02/25/2011 1338     CALCIUM  10.3  02/25/2011 1338     GFRNONAA  68*  02/25/2011 1338     GFRAA  79*  02/25/2011 1338        Assessment/Plan: Patient with thoracic stenosis with myelopathy secondary to spondylosis and spondylitic disc protrusion. She is admitted now for T10-T12 thoracic laminectomy. I've discussed with the patient the nature of her condition, the nature of the surgical procedure, typical length of surgery, hospital stay, and overall recuperation. We discussed her limitations postoperatively  and risks of surgery including risks of infection, bleeding, possible need for transfusion, the risk of nerve root dysfunction with pain, weakness, numbness, or paresthesias, risk of spinal cord dysfunction with paralysis of both lower extremities, and bowel and bladder function, and anesthetic risks of infarction, stroke, pneumonia, and death.     Hewitt Shorts, MD 03/04/2011 8:15 AM

## 2011-03-04 NOTE — Op Note (Signed)
03/04/2011  10:35 AM  PATIENT:  Theresa Crosby  78 y.o. female  PRE-OPERATIVE DIAGNOSIS:  thoracic stenosis, thoracic spondylosis with myelopathy, thoracic degenerative disc disease with myelopathy  POST-OPERATIVE DIAGNOSIS:   thoracic stenosis, thoracic spondylosis with myelopathy, thoracic degenerative disc disease with myelopathy   PROCEDURE:  Procedure(s): THORACIC LAMINECTOMY/DECOMPRESSION, T9-T12 with decompression of the central canal, lateral recesses, and exiting nerve roots  SURGEON:  Surgeon(s): Hewitt Shorts  ASSISTANTS: Marikay Alar  ANESTHESIA:   general  EBL:  Total I/O In: 1000 [I.V.:1000] Out: 50 [Blood:50]  BLOOD ADMINISTERED:none  COUNT: Correct per nursing staff  DRAINS: none   SPECIMEN:  No Specimen  DICTATION: Patient was brought to the operating room, placed under general endotracheal anesthesia. Patient was turned to a prone position. Thoracolumbar region was prepped with iodine soap and solution and draped in a sterile fashion. The midline was infiltrated with local anesthetic with epinephrine. An x-ray was taken and the T12 level localized. A midline incision was made over the lower thoracic spine. DIssection was carried down to subcutaneous tissue to the thoracolumbar fascia which was incised bilaterally. Paraspinal musculature was dissected from the spinous process lamina in a subperiosteal fashion. Another localizing x-ray was taken at T10, T11 and T12 spinous process and lamina were identified. Laminectomy was begun with double-action rongeurs and continued with the high-speed drill and Kerrison punches with thin footplate. The decompression was done using magnification and micro-dissection and microsurgical technique. At the T11-12 level there was marked stenosis, the ligamentum flavum was somewhat apparent to the dorsal aspect the dura was carefully dissected from the dura. There was also significant stenosis at the T10-11 level which was again  carefully decompressed. In the end the inferior aspect of T9 was removed as well in the T9-10 level was decompressed, with decompression of the exiting nerve roots at T9, T10, T11, and T12.  Hemostasis was established with waxing of the bone edges and the use of Gelfoam in the lateral gutters. The wound was irrigated extensively first with saline solution and then with bacitracin solution. The Gelfoam was removed and the wound irrigated again and hemostasis confirmed.  Another x-ray was taken with markers to identify the superior and inferior extent of our laminectomy and we confirmed decompression at the T9-10, T10-11, T11-12 levels.  We then proceeded with closure. Paraspinal muscles were approximated with interrupted undyed 1 Vicryl sutures. Deep fascia was closed with interrupted undyed 1 Vicryl sutures. Subcutaneous and subcuticular closed with interrupted inverted 2-0 undyed Vicryl sutures. Skin is approximate Dermabond. We'll was dressed with dressing sponges and 4 inch Hypafix.  Following surgery the patient was turned back to use a supine position to be reversed and the anesthetic extubated and transferred to the recovery room for further care   PLAN OF CARE: Admit to inpatient   PATIENT DISPOSITION:  PACU - hemodynamically stable.   Delay start of Pharmacological VTE agent (>24hrs) due to surgical blood loss or risk of bleeding:  yes

## 2011-03-04 NOTE — Anesthesia Postprocedure Evaluation (Signed)
Anesthesia Post Note  Patient: Theresa Crosby  Procedure(s) Performed:  LUMBAR LAMINECTOMY/DECOMPRESSION MICRODISCECTOMY - Thoracic Ten-Thoracic Twelve Thoracic Laminectomy  Anesthesia type: general  Patient location: PACU  Post pain: Pain level controlled  Post assessment: Patient's Cardiovascular Status Stable  Last Vitals:  Filed Vitals:   03/04/11 1152  BP:   Pulse: 56  Temp: 36.1 C  Resp: 14    Post vital signs: Reviewed and stable  Level of consciousness: sedated  Complications: No apparent anesthesia complications

## 2011-03-04 NOTE — Anesthesia Procedure Notes (Signed)
Procedure Name: Intubation Date/Time: 03/04/2011 8:41 AM Performed by: Ellin Goodie Pre-anesthesia Checklist: Patient identified, Emergency Drugs available, Suction available, Patient being monitored and Timeout performed Patient Re-evaluated:Patient Re-evaluated prior to inductionOxygen Delivery Method: Circle System Utilized Preoxygenation: Pre-oxygenation with 100% oxygen Intubation Type: IV induction Ventilation: Mask ventilation without difficulty Laryngoscope Size: Mac and 4 Grade View: Grade I Tube size: 7.5 mm Number of attempts: 1 Airway Equipment and Method: stylet Placement Confirmation: ETT inserted through vocal cords under direct vision,  positive ETCO2 and breath sounds checked- equal and bilateral Secured at: 22 cm Tube secured with: Tape Dental Injury: Teeth and Oropharynx as per pre-operative assessment

## 2011-03-05 ENCOUNTER — Encounter (HOSPITAL_COMMUNITY): Payer: Self-pay | Admitting: Neurosurgery

## 2011-03-05 MED ORDER — HYDROCODONE-ACETAMINOPHEN 5-325 MG PO TABS: 1.0000 | ORAL_TABLET | ORAL | Status: AC | PRN

## 2011-03-05 NOTE — Discharge Summary (Signed)
Physician Discharge Summary  Patient ID: Theresa Crosby MRN: [id number] DOB/AGE: [birth year] 78 y.o.  Admit date: 03/04/2011 Discharge date: 03/05/2011  Admission Diagnoses: Thoracic stenosis, thoracic spondylosis with myelopathy, thoracic degenerative disc disease with myelopathy  Discharge Diagnoses:  Thoracic stenosis, thoracic spondylosis with myelopathy, thoracic degenerative disc disease with myelopathy  Discharged Condition: good  Hospital Course: Patient was admitted underwent a T9-T12 decompressive thoracic laminectomy. She's done quite well following surgery. He is up and ambulate actively in the halls. Her wound is healing nicely. She's been given instructions regarding wound care and activities following discharge.  Discharge Exam: Blood pressure 105/71, pulse 66, temperature 97.5 F (36.4 C), temperature source Oral, resp. rate 20, SpO2 98.00%.  Disposition: Home   Current Discharge Medication List    START taking these medications   Details  HYDROcodone-acetaminophen (NORCO) 5-325 MG per tablet Take 1-2 tablets by mouth every 4 (four) hours as needed for pain. Qty: 50 tablet, Refills: 0      CONTINUE these medications which have NOT CHANGED   Details  amLODipine (NORVASC) 5 MG tablet Take 5 mg by mouth daily.      brimonidine (ALPHAGAN P) 0.1 % SOLN Place 1 drop into both eyes 2 (two) times daily.      Calcium-Vitamin D-Vitamin K 500-100-40 MG-UNT-MCG CHEW Chew 1 tablet by mouth 2 (two) times daily.      Cholecalciferol (VITAMIN D3) 1000 UNITS CAPS Take 2,000 Units by mouth every morning.      latanoprost (XALATAN) 0.005 % ophthalmic solution Place 1 drop into both eyes at bedtime.      lidocaine (LIDODERM) 5 % Place 1 patch onto the skin daily as needed. Remove & Discard patch within 12 hours or as directed by MD for pain    naproxen sodium (ANAPROX) 220 MG tablet Take 440 mg by mouth 2 (two) times daily with a meal. For pain      olmesartan-hydrochlorothiazide (BENICAR HCT) 40-25 MG per tablet Take 1 tablet by mouth daily.      Omeprazole (PRILOSEC PO) Take 1 capsule by mouth daily.      PRESCRIPTION MEDICATION Place 1 drop into both eyes 3 (three) times daily. fresh kote eye drops    conjugated estrogens (PREMARIN) vaginal cream Place 0.5 g vaginally 2 (two) times a week. Thursday & sunday     estradiol (VIVELLE-DOT) 0.0375 MG/24HR Place 1 patch onto the skin 2 (two) times a week. Thursday & Sunday          Signed: Hewitt Shorts, MD 03/05/2011, 10:19 AM

## 2011-03-05 NOTE — Progress Notes (Signed)
Utilization review completed. Reichen Hutzler, RN, BSN. 03/05/11  

## 2011-03-18 ENCOUNTER — Emergency Department (HOSPITAL_COMMUNITY): Payer: BC Managed Care – PPO

## 2011-03-18 ENCOUNTER — Emergency Department (HOSPITAL_COMMUNITY)
Admission: EM | Admit: 2011-03-18 | Discharge: 2011-03-18 | Disposition: A | Discharge status: Home / Self Care | Payer: BC Managed Care – PPO | Attending: Emergency Medicine | Admitting: Emergency Medicine

## 2011-03-18 ENCOUNTER — Encounter (HOSPITAL_COMMUNITY): Payer: Self-pay | Admitting: Emergency Medicine

## 2011-03-18 DIAGNOSIS — M1990 Unspecified osteoarthritis, unspecified site: Secondary | ICD-10-CM | POA: Insufficient documentation

## 2011-03-18 DIAGNOSIS — Z79899 Other long term (current) drug therapy: Secondary | ICD-10-CM | POA: Insufficient documentation

## 2011-03-18 DIAGNOSIS — F411 Generalized anxiety disorder: Secondary | ICD-10-CM | POA: Insufficient documentation

## 2011-03-18 DIAGNOSIS — I10 Essential (primary) hypertension: Secondary | ICD-10-CM | POA: Insufficient documentation

## 2011-03-18 DIAGNOSIS — R209 Unspecified disturbances of skin sensation: Secondary | ICD-10-CM | POA: Insufficient documentation

## 2011-03-18 DIAGNOSIS — K219 Gastro-esophageal reflux disease without esophagitis: Secondary | ICD-10-CM | POA: Insufficient documentation

## 2011-03-18 DIAGNOSIS — M549 Dorsalgia, unspecified: Secondary | ICD-10-CM | POA: Insufficient documentation

## 2011-03-18 LAB — DIFFERENTIAL
Basophils Absolute: 0.1 10*3/uL (ref 0.0–0.1)
Basophils Relative: 1 % (ref 0–1)
Eosinophils Absolute: 0.7 10*3/uL (ref 0.0–0.7)
Eosinophils Relative: 10 % — ABNORMAL HIGH (ref 0–5)
Lymphocytes Relative: 31 % (ref 12–46)
Lymphs Abs: 2.2 10*3/uL (ref 0.7–4.0)
Monocytes Absolute: 0.6 10*3/uL (ref 0.1–1.0)
Monocytes Relative: 8 % (ref 3–12)
Neutro Abs: 3.6 10*3/uL (ref 1.7–7.7)
Neutrophils Relative %: 51 % (ref 43–77)

## 2011-03-18 LAB — CBC
HCT: 32.7 % — ABNORMAL LOW (ref 36.0–46.0)
Hemoglobin: 11.3 g/dL — ABNORMAL LOW (ref 12.0–15.0)
MCH: 30.1 pg (ref 26.0–34.0)
MCHC: 34.6 g/dL (ref 30.0–36.0)
MCV: 87 fL (ref 78.0–100.0)
Platelets: 329 10*3/uL (ref 150–400)
RBC: 3.76 MIL/uL — ABNORMAL LOW (ref 3.87–5.11)
RDW: 14.7 % (ref 11.5–15.5)
WBC: 7.1 10*3/uL (ref 4.0–10.5)

## 2011-03-18 LAB — SEDIMENTATION RATE: Sed Rate: 41 mm/hr — ABNORMAL HIGH (ref 0–22)

## 2011-03-18 MED ORDER — HYDROMORPHONE HCL 2 MG PO TABS
2.0000 mg | ORAL_TABLET | Freq: Once | ORAL | Status: AC
  Administered 2011-03-18: 2 mg via ORAL
  Filled 2011-03-18: qty 1

## 2011-03-18 MED ORDER — HYDROMORPHONE HCL 2 MG PO TABS: 2.0000 mg | ORAL_TABLET | ORAL | Status: AC | PRN

## 2011-03-18 MED ORDER — GADOBENATE DIMEGLUMINE 529 MG/ML IV SOLN
10.0000 mL | Freq: Once | INTRAVENOUS | Status: AC | PRN
  Administered 2011-03-18: 10 mL via INTRAVENOUS

## 2011-03-18 NOTE — ED Provider Notes (Signed)
History     CSN: [id number]  Arrival date & time 03/18/11  Ernestina Columbia   First MD Initiated Contact with Patient 03/18/11 1939      Chief Complaint  Patient presents with  . Back Pain    (Consider location/radiation/quality/duration/timing/severity/associated sxs/prior treatment) Patient is a 78 y.o. female presenting with back pain. The history is provided by the patient and medical records.  Back Pain  Associated symptoms include numbness. Pertinent negatives include no chest pain, no fever, no headaches, no abdominal pain and no weakness.   the patient is a 78 year old female, with a history of hypertension, who presents with back pain since last night.  She has a T10-T12 thoracic laminectomy on January 16 of this year by Dr. Jule Ser.  She did not fall or injure herself.  She denies fevers, chills, nausea, vomiting.  She denies weakness in her legs.  She complains of mid pain, and numbness in her thighs and in her feet.  She denies bowel or bladder incontinence.  Past Medical History  Diagnosis Date  . Hypertension     ireg. heartbeat, no referral made to cardiologist , sees [facility]. Med.   . Shortness of breath   . Blood transfusion     postop- back surg. 1987  . GERD (gastroesophageal reflux disease)   . Arthritis     degeneration - spondylosis, OA - all over body,   . Neuromuscular disorder     burning- L leg , numbing of toes   . Anxiety     2005- related to care of aging parent     Past Surgical History  Procedure Date  . Back surgery     1987-   . Eye surgery     cataracts removed- bilateral, no IOL  . Abdominal hysterectomy     partial hysterectomy- 1972  . Tubal ligation   . Toe surgery     R foot- removed calus between 5-4  . Lumbar laminectomy/decompression microdiscectomy 03/04/2011    Procedure: LUMBAR LAMINECTOMY/DECOMPRESSION MICRODISCECTOMY;  Surgeon: Hewitt Shorts, MD;  Location: [facility];  Service: Neurosurgery;  Laterality: N/A;  Thoracic Ten-Thoracic  Twelve Thoracic Laminectomy    Family History  Problem Relation Age of Onset  . Anesthesia problems Neg Hx   . Hypotension Neg Hx   . Malignant hyperthermia Neg Hx   . Pseudochol deficiency Neg Hx     History  Substance Use Topics  . Smoking status: Never Smoker   . Smokeless tobacco: Not on file  . Alcohol Use: No    OB History    Grav Para Term Preterm Abortions TAB SAB Ect Mult Living                  Review of Systems  Constitutional: Negative for fever and chills.  HENT: Negative for neck pain.   Cardiovascular: Negative for chest pain.  Gastrointestinal: Negative for nausea, vomiting and abdominal pain.  Musculoskeletal: Positive for back pain.  Skin: Negative for wound.  Neurological: Positive for numbness. Negative for weakness and headaches.  Psychiatric/Behavioral: Negative for confusion.  All other systems reviewed and are negative.    Allergies  Lactose intolerance (gi)  Home Medications   Current Outpatient Rx  Name Route Sig Dispense Refill  . AMLODIPINE BESYLATE 5 MG PO TABS Oral Take 5 mg by mouth daily.      Marland Kitchen BRIMONIDINE TARTRATE 0.1 % OP SOLN Both Eyes Place 1 drop into both eyes 2 (two) times daily.      Marland Kitchen  CALCIUM-VITAMIN D-VITAMIN K 500-100-40 MG-UNT-MCG PO CHEW Oral Chew 1 tablet by mouth 2 (two) times daily.      Marland Kitchen VITAMIN D3 1000 UNITS PO CAPS Oral Take 2,000 Units by mouth every morning.      Marland Kitchen ESTROGENS, CONJUGATED 0.625 MG/GM VA CREA Vaginal Place 0.5 g vaginally 2 (two) times a week. Thursday & sunday     . ESTRADIOL 0.0375 MG/24HR TD PTTW Transdermal Place 1 patch onto the skin 2 (two) times a week. Thursday & Sunday     . LATANOPROST 0.005 % OP SOLN Both Eyes Place 1 drop into both eyes at bedtime.      Marland Kitchen LIDOCAINE 5 % EX PTCH Transdermal Place 1 patch onto the skin daily as needed. Remove & Discard patch within 12 hours or as directed by MD for pain    . NAPROXEN SODIUM 220 MG PO TABS Oral Take 440 mg by mouth 2 (two) times daily with  a meal. For pain    . OLMESARTAN MEDOXOMIL-HCTZ 40-25 MG PO TABS Oral Take 1 tablet by mouth daily.      Marland Kitchen PRILOSEC PO Oral Take 1 capsule by mouth daily.      Marland Kitchen POLYVINYL ALCOHOL-POVIDONE 2.7-2 % OP SOLN Both Eyes Place 1 drop into both eyes 3 (three) times daily.      BP 152/100  Pulse 85  Temp(Src) 98 F (36.7 C) (Oral)  Resp 20  SpO2 99%  Physical Exam  Constitutional: She is oriented to person, place, and time. She appears well-developed and well-nourished.  HENT:  Head: Normocephalic and atraumatic.  Eyes: Pupils are equal, round, and reactive to light.  Neck: Normal range of motion. Neck supple.  Cardiovascular: Normal rate.   No murmur heard. Pulmonary/Chest: Effort normal and breath sounds normal. No respiratory distress.  Abdominal: Soft. Bowel sounds are normal. There is no tenderness.  Musculoskeletal: Normal range of motion. She exhibits no edema.       Mild paraspinal tenderness along the suture lines, which appear well healed, with no signs of infection.  There is no erythema or discharge.  Neurological: She is alert and oriented to person, place, and time. No cranial nerve deficit.       Strength is 5 over 5 in bilateral lower extremities in all muscle groups.  Skin: Skin is warm and dry. No erythema.  Psychiatric: She has a normal mood and affect. Judgment and thought content normal.    ED Course  Procedures (including critical care time) 78.  Her old female, with recent surgery on her back, complains of increasing back pain without trauma or systemic symptoms, along with numbness in her lower extremities.  We will treat her pain.  I discussed the case with Dr. Newell Coral, who recommended an MRI, and a CBC, and sedimentation rate.  We will perform those tests, as well.   Labs Reviewed  CBC  DIFFERENTIAL  SEDIMENTATION RATE   No results found.   No diagnosis found.  Spoke with Dr. Franky Macho and reviewed mri.  He said pt can go home and f/u in office tomorrow.   Explained this to pt and daughter.  They understand and agree with plan.   Pain controlled after dilaudid.  MDM  Back pain, status post recent thoracic spine surgery. Lower extremity paresthesias without weakness.  No signs of leukocytosis or evidence of infection.        Nicholes Stairs, MD 03/18/11 2322

## 2011-03-18 NOTE — ED Notes (Signed)
Pt. Returned from MRI, family at bs.  Pain reported to be better at 3 on 0-10 scale.

## 2011-03-18 NOTE — ED Notes (Signed)
Pt. In hallway stretcher resting quietly, family at bedside, pt. C/o mid back at site of back surgery on 01/16.

## 2011-03-18 NOTE — ED Notes (Signed)
PT. REPORTS MID BACK PAIN /UPPER BACK PAIN ONSET LAST NIGHT , DENIES RECENT INJURY OR FALL,  STATES BACK SURGERY LAST JAN. 16, 2013 BY DR. Reita Cliche , ALSO REPORTS INTERMITTENT BILATERAL LEG TINGLING/NUMBNESS. AMBULATORY. NO FEVER OR CHILLS.

## 2011-09-15 HISTORY — PX: NM MYOVIEW LTD: HXRAD82

## 2011-09-24 ENCOUNTER — Ambulatory Visit
Admission: RE | Admit: 2011-09-24 | Discharge: 2011-09-24 | Disposition: A | Discharge status: Home / Self Care | Payer: BC Managed Care – PPO | Source: Ambulatory Visit | Attending: Cardiovascular Disease | Admitting: Cardiovascular Disease

## 2011-09-24 ENCOUNTER — Other Ambulatory Visit: Payer: Self-pay | Admitting: Cardiovascular Disease

## 2011-09-24 DIAGNOSIS — Z01811 Encounter for preprocedural respiratory examination: Secondary | ICD-10-CM

## 2011-09-30 ENCOUNTER — Other Ambulatory Visit: Payer: Self-pay | Admitting: Cardiovascular Disease

## 2011-10-02 ENCOUNTER — Encounter (HOSPITAL_COMMUNITY)
Admission: RE | Disposition: A | Discharge status: Skilled Nursing Facility | Payer: Self-pay | Source: Ambulatory Visit | Attending: Surgery

## 2011-10-02 ENCOUNTER — Inpatient Hospital Stay (HOSPITAL_COMMUNITY)
Admission: RE | Admit: 2011-10-02 | Discharge: 2011-10-08 | DRG: 107 | Disposition: A | Discharge status: Skilled Nursing Facility | Payer: BC Managed Care – PPO | Source: Ambulatory Visit | Attending: Surgery | Admitting: Surgery

## 2011-10-02 ENCOUNTER — Other Ambulatory Visit: Payer: Self-pay | Admitting: *Deleted

## 2011-10-02 DIAGNOSIS — K5900 Constipation, unspecified: Secondary | ICD-10-CM | POA: Diagnosis present

## 2011-10-02 DIAGNOSIS — F411 Generalized anxiety disorder: Secondary | ICD-10-CM | POA: Diagnosis present

## 2011-10-02 DIAGNOSIS — D62 Acute posthemorrhagic anemia: Secondary | ICD-10-CM | POA: Diagnosis not present

## 2011-10-02 DIAGNOSIS — E119 Type 2 diabetes mellitus without complications: Secondary | ICD-10-CM | POA: Diagnosis present

## 2011-10-02 DIAGNOSIS — E785 Hyperlipidemia, unspecified: Secondary | ICD-10-CM | POA: Diagnosis present

## 2011-10-02 DIAGNOSIS — M129 Arthropathy, unspecified: Secondary | ICD-10-CM | POA: Diagnosis present

## 2011-10-02 DIAGNOSIS — G629 Polyneuropathy, unspecified: Secondary | ICD-10-CM | POA: Diagnosis present

## 2011-10-02 DIAGNOSIS — I2510 Atherosclerotic heart disease of native coronary artery without angina pectoris: Secondary | ICD-10-CM

## 2011-10-02 DIAGNOSIS — R9439 Abnormal result of other cardiovascular function study: Secondary | ICD-10-CM | POA: Diagnosis present

## 2011-10-02 DIAGNOSIS — E875 Hyperkalemia: Secondary | ICD-10-CM | POA: Diagnosis not present

## 2011-10-02 DIAGNOSIS — Z7982 Long term (current) use of aspirin: Secondary | ICD-10-CM

## 2011-10-02 DIAGNOSIS — K219 Gastro-esophageal reflux disease without esophagitis: Secondary | ICD-10-CM | POA: Diagnosis present

## 2011-10-02 DIAGNOSIS — R079 Chest pain, unspecified: Secondary | ICD-10-CM | POA: Diagnosis present

## 2011-10-02 DIAGNOSIS — Z951 Presence of aortocoronary bypass graft: Secondary | ICD-10-CM

## 2011-10-02 DIAGNOSIS — G5790 Unspecified mononeuropathy of unspecified lower limb: Secondary | ICD-10-CM | POA: Diagnosis present

## 2011-10-02 DIAGNOSIS — E8779 Other fluid overload: Secondary | ICD-10-CM | POA: Diagnosis not present

## 2011-10-02 DIAGNOSIS — I10 Essential (primary) hypertension: Secondary | ICD-10-CM | POA: Diagnosis present

## 2011-10-02 DIAGNOSIS — Z79899 Other long term (current) drug therapy: Secondary | ICD-10-CM

## 2011-10-02 HISTORY — DX: Abnormal result of other cardiovascular function study: R94.39

## 2011-10-02 HISTORY — DX: Presence of aortocoronary bypass graft: Z95.1

## 2011-10-02 HISTORY — DX: Atherosclerotic heart disease of native coronary artery without angina pectoris: I25.10

## 2011-10-02 HISTORY — PX: LEFT HEART CATHETERIZATION WITH CORONARY ANGIOGRAM: SHX5451

## 2011-10-02 LAB — MRSA PCR SCREENING: MRSA by PCR: NEGATIVE

## 2011-10-02 SURGERY — LEFT HEART CATHETERIZATION WITH CORONARY ANGIOGRAM
Anesthesia: LOCAL

## 2011-10-02 MED ORDER — LIDOCAINE HCL (PF) 1 % IJ SOLN
INTRAMUSCULAR | Status: AC
  Filled 2011-10-02: qty 30

## 2011-10-02 MED ORDER — SODIUM CHLORIDE 0.9 % IV SOLN
INTRAVENOUS | Status: AC
  Administered 2011-10-02: 18:00:00 via INTRAVENOUS

## 2011-10-02 MED ORDER — SODIUM CHLORIDE 0.9 % IJ SOLN: 3.0000 mL | INTRAMUSCULAR | Status: DC | PRN

## 2011-10-02 MED ORDER — PANTOPRAZOLE SODIUM 40 MG PO TBEC
40.0000 mg | DELAYED_RELEASE_TABLET | Freq: Every day | ORAL | Status: DC
  Administered 2011-10-03 – 2011-10-05 (×2): 40 mg via ORAL
  Filled 2011-10-02 (×2): qty 1

## 2011-10-02 MED ORDER — ONDANSETRON HCL 4 MG/2ML IJ SOLN: 4.0000 mg | Freq: Four times a day (QID) | INTRAMUSCULAR | Status: DC | PRN

## 2011-10-02 MED ORDER — NITROGLYCERIN 0.2 MG/ML ON CALL CATH LAB
INTRAVENOUS | Status: AC
  Filled 2011-10-02: qty 1

## 2011-10-02 MED ORDER — MIDAZOLAM HCL 2 MG/2ML IJ SOLN
INTRAMUSCULAR | Status: AC
  Filled 2011-10-02: qty 2

## 2011-10-02 MED ORDER — ACETAMINOPHEN 325 MG PO TABS: 650.0000 mg | ORAL_TABLET | ORAL | Status: DC | PRN

## 2011-10-02 MED ORDER — DIAZEPAM 5 MG PO TABS
ORAL_TABLET | ORAL | Status: AC
  Administered 2011-10-02: 5 mg via ORAL
  Filled 2011-10-02: qty 1

## 2011-10-02 MED ORDER — MORPHINE SULFATE 2 MG/ML IJ SOLN: 1.0000 mg | INTRAMUSCULAR | Status: DC | PRN

## 2011-10-02 MED ORDER — CYCLOSPORINE 0.05 % OP EMUL
1.0000 [drp] | Freq: Two times a day (BID) | OPHTHALMIC | Status: DC
  Administered 2011-10-02 – 2011-10-03 (×2): 1 [drp] via OPHTHALMIC
  Administered 2011-10-03: 11:00:00 via OPHTHALMIC
  Filled 2011-10-02 (×5): qty 1

## 2011-10-02 MED ORDER — ASPIRIN 81 MG PO CHEW
CHEWABLE_TABLET | ORAL | Status: AC
  Administered 2011-10-02: 324 mg via ORAL
  Filled 2011-10-02: qty 4

## 2011-10-02 MED ORDER — ASPIRIN 81 MG PO CHEW
324.0000 mg | CHEWABLE_TABLET | ORAL | Status: AC
  Administered 2011-10-02: 324 mg via ORAL

## 2011-10-02 MED ORDER — SODIUM CHLORIDE 0.9 % IV SOLN
INTRAVENOUS | Status: DC
  Administered 2011-10-02: 12:00:00 via INTRAVENOUS

## 2011-10-02 MED ORDER — FENTANYL CITRATE 0.05 MG/ML IJ SOLN
INTRAMUSCULAR | Status: AC
  Filled 2011-10-02: qty 2

## 2011-10-02 MED ORDER — DIAZEPAM 5 MG PO TABS
5.0000 mg | ORAL_TABLET | ORAL | Status: AC
  Administered 2011-10-02: 5 mg via ORAL

## 2011-10-02 MED ORDER — ALPRAZOLAM 0.25 MG PO TABS
0.2500 mg | ORAL_TABLET | Freq: Three times a day (TID) | ORAL | Status: DC | PRN
  Administered 2011-10-02 – 2011-10-03 (×2): 0.25 mg via ORAL
  Filled 2011-10-02 (×2): qty 1

## 2011-10-02 MED ORDER — AMLODIPINE BESYLATE 5 MG PO TABS
5.0000 mg | ORAL_TABLET | Freq: Every day | ORAL | Status: DC
  Administered 2011-10-03 – 2011-10-08 (×5): 5 mg via ORAL
  Filled 2011-10-02 (×7): qty 1

## 2011-10-02 MED ORDER — HEPARIN (PORCINE) IN NACL 2-0.9 UNIT/ML-% IJ SOLN
INTRAMUSCULAR | Status: AC
  Filled 2011-10-02: qty 2000

## 2011-10-02 NOTE — Op Note (Signed)
Theresa Crosby is a 78 y.o. female    [id number] LOCATION:  FACILITY: [facility]  PHYSICIAN: Nanetta Batty, M.D. 09/29/1932   DATE OF PROCEDURE:  10/02/2011  DATE OF DISCHARGE:  [facility]  CARDIAC CATHETERIZATION     History obtained from chart review. Theresa Crosby is a [age]-year-old African female with a history of hypertension saw Dr. Royann Shivers in the office on 09/22/2011. She was complaining of chest pain as well as dyspnea. A Myoview stress test was abnormal and cardiac catheterization was recommended to define her anatomy.   PROCEDURE DESCRIPTION:    The patient was brought to the second floor  [place] [facility]iac cath lab in the postabsorptive state. She was  premedicated with Valium 5 mg by mouth. Her right groin was prepped and shaved in usual sterile fashion. Xylocaine 1% was used  for local anesthesia. A 5 French sheath was inserted into the right common femoral  artery using standard Seldinger technique. 5 French right and left Judkins diagnostic catheters along with a 5 French pigtail catheter were used for selective coronary angiography, subselective left and right internal mammary artery angiography, left ventriculography and distal abdominal aortography. Visipaque dye was used for the entirety of the case. Retrograde aortic, left ventricular and pullback pressures were recorded.   HEMODYNAMICS:    AO SYSTOLIC/AO DIASTOLIC: 148/82   LV SYSTOLIC/LV DIASTOLIC: 144/15  ANGIOGRAPHIC RESULTS:   1. Left main; 20% distal taper to  2. LAD; 99% calcified eccentric proximal at the takeoff of the first diagonal branch. The first diagonal branch was a moderate size vessel and was bifurcating. The origin and first third of the vessel had a long  80+ percent stenosis. 3. Left circumflex; 80% mid OM 1.  4. Right coronary artery; dominant with at most 40% mid 5.LIMA and RIMA were subselectively visualized and widely patent. Each was suitable for use during  coronary bypass grafting if required. 6. Left ventriculography; RAO left ventriculogram was performed using  25 mL of Visipaque dye at 12 mL/second. The overall LVEF estimated  60 % Without wall motion abnormalities  7. Distal abdominal aortogram-the renal arteries will grow a patent. The infrarenal WR and iliac bifurcation were free of significant atherosclerotic changes.  IMPRESSION:Theresa Crosby has high-grade calcified proximal LAD diagonal branch bifurcation disease not suitable for percutaneous intervention. In addition she has a highly diseased diagonal branch as well as disease in her obtuse marginal branch. I believe her anatomy is for coronary artery bypass grafting. She has widely patent right and left internal mammary arteries. LV function is normal. I contacted TCTS To make him aware of her. The sheath was removed and pressure was held on the groin to achieve hemostasis. The patient left the lab in stable condition.  Runell Gess MD, [facility] 10/02/2011 3:50 PM

## 2011-10-03 DIAGNOSIS — Z01810 Encounter for preprocedural cardiovascular examination: Secondary | ICD-10-CM

## 2011-10-03 DIAGNOSIS — I2510 Atherosclerotic heart disease of native coronary artery without angina pectoris: Secondary | ICD-10-CM

## 2011-10-03 MED ORDER — VANCOMYCIN HCL 1000 MG IV SOLR
1250.0000 mg | INTRAVENOUS | Status: AC
  Administered 2011-10-04: 1250 mg via INTRAVENOUS
  Filled 2011-10-03: qty 1250

## 2011-10-03 MED ORDER — TRANEXAMIC ACID 100 MG/ML IV SOLN
1.5000 mg/kg/h | INTRAVENOUS | Status: AC
  Administered 2011-10-04: 1.5 mg/kg/h via INTRAVENOUS
  Filled 2011-10-03 (×2): qty 25

## 2011-10-03 MED ORDER — MAGNESIUM SULFATE 50 % IJ SOLN
40.0000 meq | INTRAMUSCULAR | Status: DC
  Filled 2011-10-03 (×2): qty 10

## 2011-10-03 MED ORDER — DEXTROSE 5 % IV SOLN
0.5000 ug/min | INTRAVENOUS | Status: DC
  Filled 2011-10-03: qty 4

## 2011-10-03 MED ORDER — DEXTROSE 5 % IV SOLN
1.5000 g | INTRAVENOUS | Status: AC
  Administered 2011-10-04: 1.5 g via INTRAVENOUS
  Administered 2011-10-04: .75 g via INTRAVENOUS
  Filled 2011-10-03: qty 1.5

## 2011-10-03 MED ORDER — DEXMEDETOMIDINE HCL IN NACL 400 MCG/100ML IV SOLN
0.1000 ug/kg/h | INTRAVENOUS | Status: AC
  Administered 2011-10-04: 0.2 ug/kg/h via INTRAVENOUS
  Filled 2011-10-03: qty 100

## 2011-10-03 MED ORDER — SODIUM CHLORIDE 0.9 % IV SOLN
INTRAVENOUS | Status: AC
  Administered 2011-10-04: .8 [IU]/h via INTRAVENOUS
  Filled 2011-10-03 (×2): qty 1

## 2011-10-03 MED ORDER — PHENYLEPHRINE HCL 10 MG/ML IJ SOLN
30.0000 ug/min | INTRAVENOUS | Status: DC
  Filled 2011-10-03: qty 2

## 2011-10-03 MED ORDER — POTASSIUM CHLORIDE 2 MEQ/ML IV SOLN
80.0000 meq | INTRAVENOUS | Status: DC
  Filled 2011-10-03 (×2): qty 40

## 2011-10-03 MED ORDER — METOPROLOL TARTRATE 12.5 MG HALF TABLET
12.5000 mg | ORAL_TABLET | Freq: Once | ORAL | Status: AC
  Administered 2011-10-04: 12.5 mg via ORAL
  Filled 2011-10-03: qty 1

## 2011-10-03 MED ORDER — TEMAZEPAM 15 MG PO CAPS: 15.0000 mg | ORAL_CAPSULE | Freq: Once | ORAL | Status: AC | PRN

## 2011-10-03 MED ORDER — DIAZEPAM 5 MG PO TABS
5.0000 mg | ORAL_TABLET | Freq: Once | ORAL | Status: AC
  Administered 2011-10-04: 5 mg via ORAL
  Filled 2011-10-03: qty 1

## 2011-10-03 MED ORDER — TRANEXAMIC ACID (OHS) PUMP PRIME SOLUTION
2.0000 mg/kg | INTRAVENOUS | Status: DC
  Filled 2011-10-03 (×2): qty 1.01

## 2011-10-03 MED ORDER — NITROGLYCERIN IN D5W 200-5 MCG/ML-% IV SOLN
2.0000 ug/min | INTRAVENOUS | Status: AC
  Administered 2011-10-04: 10 ug/min via INTRAVENOUS
  Filled 2011-10-03: qty 250

## 2011-10-03 MED ORDER — BISACODYL 5 MG PO TBEC
5.0000 mg | DELAYED_RELEASE_TABLET | Freq: Once | ORAL | Status: AC
  Administered 2011-10-03: 5 mg via ORAL
  Filled 2011-10-03: qty 1

## 2011-10-03 MED ORDER — DEXTROSE 5 % IV SOLN
750.0000 mg | INTRAVENOUS | Status: DC
  Filled 2011-10-03 (×2): qty 750

## 2011-10-03 MED ORDER — SODIUM BICARBONATE 8.4 % IV SOLN
INTRAVENOUS | Status: AC
  Administered 2011-10-04: 09:00:00
  Filled 2011-10-03: qty 2.5

## 2011-10-03 MED ORDER — DOPAMINE-DEXTROSE 3.2-5 MG/ML-% IV SOLN
2.0000 ug/kg/min | INTRAVENOUS | Status: DC
  Filled 2011-10-03: qty 250

## 2011-10-03 MED ORDER — ALPRAZOLAM 0.25 MG PO TABS
0.2500 mg | ORAL_TABLET | ORAL | Status: DC | PRN
  Administered 2011-10-05 – 2011-10-06 (×2): 0.25 mg via ORAL
  Filled 2011-10-03 (×2): qty 1

## 2011-10-03 MED ORDER — CHLORHEXIDINE GLUCONATE 4 % EX LIQD
60.0000 mL | Freq: Once | CUTANEOUS | Status: AC
  Administered 2011-10-04: 4 via TOPICAL
  Filled 2011-10-03: qty 60

## 2011-10-03 MED ORDER — TRANEXAMIC ACID (OHS) BOLUS VIA INFUSION
15.0000 mg/kg | INTRAVENOUS | Status: AC
  Administered 2011-10-04: 760.5 mg via INTRAVENOUS
  Filled 2011-10-03: qty 761

## 2011-10-03 NOTE — Consult Note (Signed)
[address]            [name] [address]          [phone]      Reason for Consult: Severe multivessel coronary disease Referring Physician: Dr. Rachelle Hora Croitoru  Theresa Crosby is an 78 y.o. female.  HPI:   She recently presented to her primary physician with complaints of exertional substernal chest discomfort and shortness of breath as well as exertional fatigue and tiredness. 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 She had associated dizziness and nausea and felt like she might pass out. She underwent a vasodilator stress test showing mild mid anterior and apical anterior reversible ischemia with normal left ventricular systolic function. Cardiac catheterization yesterday showed severe three-vessel coronary disease with a 99% proximal LAD stenosis.  Past Medical History  Diagnosis Date  . Hypertension     ireg. heartbeat, no referral made to cardiologist , sees [facility]. Med.   . Shortness of breath   . Blood transfusion     postop- back surg. 1987  . GERD (gastroesophageal reflux disease)   . Arthritis     degeneration - spondylosis, OA - all over body,   . Neuromuscular disorder     burning- L leg , numbing of toes   . Anxiety     2005- related to care of aging parent     Past Surgical History  Procedure Date  . Back surgery     1987-   . Eye surgery     cataracts removed- bilateral, no IOL  . Abdominal hysterectomy     partial hysterectomy- 1972  . Tubal ligation   . Toe surgery     R foot- removed calus between 5-4  . Lumbar laminectomy/decompression microdiscectomy 03/04/2011    Procedure: LUMBAR LAMINECTOMY/DECOMPRESSION MICRODISCECTOMY;  Surgeon: Hewitt Shorts, MD;  Location: [facility];  Service: Neurosurgery;  Laterality: N/A;  Thoracic Ten-Thoracic Twelve Thoracic Laminectomy    Family History  Problem Relation Age of Onset  . Anesthesia problems Neg Hx   .  Hypotension Neg Hx   . Malignant hyperthermia Neg Hx   . Pseudochol deficiency Neg Hx     Social History:  reports that she has never smoked. She does not have any smokeless tobacco history on file. She reports that she does not drink alcohol or use illicit drugs.  Allergies:  Allergies  Allergen Reactions  . Lactose Intolerance (Gi)     Medications:  I have reviewed the patient's current medications. Prior to Admission:  Prescriptions prior to admission  Medication Sig Dispense Refill  . amLODipine (NORVASC) 5 MG tablet Take 5 mg by mouth daily.        . brimonidine (ALPHAGAN P) 0.1 % SOLN Place 1 drop into both eyes 2 (two) times daily.        . Calcium-Vitamin D-Vitamin K 500-100-40 MG-UNT-MCG CHEW Chew 1 tablet by mouth 2 (two) times daily.        . Cholecalciferol (VITAMIN D3) 1000 UNITS CAPS Take 2,000 Units by mouth every morning.        . conjugated estrogens (PREMARIN) vaginal cream Place 0.5 g vaginally 2 (two) times a week. Thursday & sunday       . cycloSPORINE (RESTASIS) 0.05 % ophthalmic emulsion Place 1 drop into both eyes 2 (two) times  daily.      . estradiol (VIVELLE-DOT) 0.0375 MG/24HR Place 1 patch onto the skin 2 (two) times a week. Thursday & Sunday       . latanoprost (XALATAN) 0.005 % ophthalmic solution Place 1 drop into both eyes at bedtime.        . Omeprazole (PRILOSEC PO) Take 1 capsule by mouth daily.        . Polyvinyl Alcohol-Povidone (FRESHKOTE) 2.7-2 % SOLN Place 1 drop into both eyes 3 (three) times daily.       Scheduled:   . amLODipine  5 mg Oral Daily  . aspirin  324 mg Oral Pre-Cath  . cycloSPORINE  1 drop Both Eyes BID  . diazepam  5 mg Oral On Call  . fentaNYL      . heparin      . lidocaine      . midazolam      . nitroGLYCERIN      . pantoprazole  40 mg Oral Q1200   Continuous:   . sodium chloride 75 mL/hr at 10/02/11 1800  . DISCONTD: sodium chloride 75 mL/hr at 10/02/11 1142   ZOX:[id number], ALPRAZolam, ALPRAZolam,  morphine injection, ondansetron (ZOFRAN) IV, DISCONTD: sodium chloride  Results for orders placed during the hospital encounter of 10/02/11 (from the past 48 hour(s))  MRSA PCR SCREENING     Status: Normal   Collection Time   10/02/11  6:02 PM      Component Value Range Comment   MRSA by PCR NEGATIVE  NEGATIVE     No results found.  Review of Systems  Constitutional: Positive for malaise/fatigue.  HENT: Negative.   Eyes: Negative.   Respiratory: Positive for shortness of breath.   Cardiovascular: Positive for chest pain, palpitations and leg swelling. Negative for orthopnea and PND.  Gastrointestinal: Negative.   Genitourinary: Negative.   Musculoskeletal: Positive for back pain.  Skin: Negative.   Neurological: Positive for dizziness, tingling and sensory change.       Tingling and sensory change in her legs that is worse on the right than the left.  Endo/Heme/Allergies: Bruises/bleeds easily.  Psychiatric/Behavioral: Negative.    Blood pressure 124/72, pulse 75, temperature 97.8 F (36.6 C), temperature source Oral, resp. rate 21, height 4\' 11"  (1.499 m), weight 50.7 kg (111 lb 12.4 oz), SpO2 99.00%. Physical Exam  Constitutional: She is oriented to person, place, and time. She appears well-developed and well-nourished. No distress.  HENT:  Head: Normocephalic and atraumatic.  Mouth/Throat: Oropharynx is clear and moist.  Eyes: EOM are normal. Pupils are equal, round, and reactive to light.  Neck: Normal range of motion. Neck supple. No JVD present. No thyromegaly present.  Cardiovascular: Normal rate, regular rhythm, normal heart sounds and intact distal pulses.  Exam reveals no gallop and no friction rub.   No murmur heard. Respiratory: Effort normal and breath sounds normal. No respiratory distress. She has no rales.  GI: Soft. Bowel sounds are normal. She exhibits no distension and no mass. There is no tenderness.  Musculoskeletal: She exhibits no edema.    Lymphadenopathy:    She has no cervical adenopathy.  Neurological: She is alert and oriented to person, place, and time. She has normal strength. No cranial nerve deficit or sensory deficit.  Skin: Skin is warm and dry.  Psychiatric: She has a normal mood and affect.   HEMODYNAMICS:     AO SYSTOLIC/AO DIASTOLIC: 148/82    LV SYSTOLIC/LV DIASTOLIC: 144/15  ANGIOGRAPHIC RESULTS:   1. Left main;  20% distal taper to   2. LAD; 99% calcified eccentric proximal at the takeoff of the first diagonal branch. The first diagonal branch was a moderate size vessel and was bifurcating. The origin and first third of the vessel had a long  80+ percent stenosis. 3. Left circumflex; 80% mid OM 1.   4. Right coronary artery; dominant with at most 40% mid 5.LIMA and RIMA were subselectively visualized and widely patent. Each was suitable for use during coronary bypass grafting if required. 6. Left ventriculography; RAO left ventriculogram was performed using   25 mL of Visipaque dye at 12 mL/second. The overall LVEF estimated   60 % Without wall motion abnormalities  7. Distal abdominal aortogram-the renal arteries will grow a patent. The infrarenal WR and iliac bifurcation were free of significant atherosclerotic changes.  IMPRESSION:Ms. Jansma has high-grade calcified proximal LAD diagonal branch bifurcation disease not suitable for percutaneous intervention. In addition she has a highly diseased diagonal branch as well as disease in her obtuse marginal branch. I believe her anatomy is for coronary artery bypass grafting. She has widely patent right and left internal mammary arteries. LV function is normal. I contacted TCTS To make him aware of her. The sheath was removed and pressure was held on the groin to achieve hemostasis. The patient left the lab in stable condition.  Runell Gess MD, [facility] 10/02/2011 3:50 PM    Assessment/Plan:  She has severe multivessel coronary disease and with an  abnormal stress test and significant lifestyle limiting symptoms. I agree that her coronary stenoses are most amenable to coronary bypass graft surgery. Due to her tight proximal LAD stenosis and a full operative schedule early this week we'll plan to proceed with coronary bypass graft surgery tomorrow morning. I discussed the operative procedure with the patient and fiance' including alternatives, benefits and risks; including but not limited to bleeding, blood transfusion, infection, stroke, myocardial infarction, graft failure, heart block requiring a permanent pacemaker, organ dysfunction, and death.  Claudius Sis understands and agrees to proceed.  We will schedule surgery for tomorrow morning.  BARTLE,BRYAN K 10/03/2011, 8:38 AM

## 2011-10-03 NOTE — Progress Notes (Signed)
Pre-op Cardiac Surgery  Carotid Findings:  No ICA stenosis.  Vertebral flow is antegrade.  Tortuous vessels.  Upper Extremity Right Left  Brachial Pressures 150 T 142 T  Radial Waveforms T T  Ulnar Waveforms T T  Palmar Arch (Allen's Test) WNL Doppler signal remains normal with radial compression and diminishes >50% with ulnar compression.   Findings:      Lower  Extremity Right Left  Dorsalis Pedis    Anterior Tibial    Posterior Tibial    Ankle/Brachial Indices      Findings:  Palpable

## 2011-10-03 NOTE — Progress Notes (Signed)
[facility]  DAILY PROGRESS NOTE   Subjective:  Results of LHC reviewed - she has surgical anatomy with high grade proximal LAD disease.  No chest pain overnight.  Objective:  Temp:  [97 F (36.1 C)-98.6 F (37 C)] 98.6 F (37 C) (08/17 0329) Pulse Rate:  [68-86] 75  (08/16 2000) Resp:  [11-21] 21  (08/17 0329) BP: (121-171)/(70-120) 124/72 mmHg (08/17 0329) SpO2:  [99 %-100 %] 99 % (08/16 2000) Weight:  [50.7 kg (111 lb 12.4 oz)-51.256 kg (113 lb)] 50.7 kg (111 lb 12.4 oz) (08/16 1712) Weight change:   Intake/Output from previous day: 08/16 0701 - 08/17 0700 In: 690 [P.O.:240; I.V.:450] Out: 700 [Urine:700]  Intake/Output from this shift:    Medications: Current Facility-Administered Medications  Medication Dose Route Frequency Provider Last Rate Last Dose  . 0.9 %  sodium chloride infusion   Intravenous Continuous Runell Gess, MD 75 mL/hr at 10/02/11 1800    . acetaminophen (TYLENOL) tablet 650 mg  650 mg Oral Q4H PRN Runell Gess, MD      . ALPRAZolam Prudy Feeler) tablet 0.25 mg  0.25 mg Oral TID PRN Runell Gess, MD   0.25 mg at 10/03/11 0134  . amLODipine (NORVASC) tablet 5 mg  5 mg Oral Daily Runell Gess, MD      . aspirin chewable tablet 324 mg  324 mg Oral Pre-Cath Runell Gess, MD   324 mg at 10/02/11 1150  . cycloSPORINE (RESTASIS) 0.05 % ophthalmic emulsion 1 drop  1 drop Both Eyes BID Runell Gess, MD   1 drop at 10/02/11 2111  . diazepam (VALIUM) tablet 5 mg  5 mg Oral On Call Runell Gess, MD   5 mg at 10/02/11 1152  . fentaNYL (SUBLIMAZE) 0.05 MG/ML injection           . heparin 2-0.9 UNIT/ML-% infusion           . lidocaine (XYLOCAINE) 1 % injection           . midazolam (VERSED) 2 MG/2ML injection           . morphine 2 MG/ML injection 1 mg  1 mg Intravenous Q1H PRN Runell Gess, MD      . nitroGLYCERIN (NTG ON-CALL) 0.2 mg/mL injection           . ondansetron (ZOFRAN) injection 4 mg  4 mg  Intravenous Q6H PRN Runell Gess, MD      . pantoprazole (PROTONIX) EC tablet 40 mg  40 mg Oral Q1200 Runell Gess, MD      . DISCONTD: 0.9 %  sodium chloride infusion   Intravenous Continuous Runell Gess, MD 75 mL/hr at 10/02/11 1142    . DISCONTD: sodium chloride 0.9 % injection 3 mL  3 mL Intravenous PRN Runell Gess, MD        Physical Exam: General appearance: alert and no distress Neck: no adenopathy, no carotid bruit, no JVD, supple, symmetrical, trachea midline and thyroid not enlarged, symmetric, no tenderness/mass/nodules Lungs: clear to auscultation bilaterally Heart: regular rate and rhythm, S1, S2 normal, no murmur, click, rub or gallop Abdomen: soft, non-tender; bowel sounds normal; no masses,  no organomegaly Extremities: extremities normal, atraumatic, no cyanosis or edema Pulses: 2+ and symmetric  Lab Results: Results for orders placed during the hospital encounter of 10/02/11 (from the past 48 hour(s))  MRSA PCR SCREENING     Status: Normal   Collection  Time   10/02/11  6:02 PM      Component Value Range Comment   MRSA by PCR NEGATIVE  NEGATIVE     Imaging: No results found.  Assessment:  1. Active Problems: 2.  * No active hospital problems. *  3. Multivessel CAD 4. Abnormal stress test indicating ischemia  Plan:  1. High grade, calcified proximal LAD stenosis, OM1 and D1 disease. Would be a high-risk PCI. Surgical revascularization is preferred. Amazingly, she did not have complications from spinal stenosis surgery in January of this year. Plan for TCTS evaluation today and disposition based on their assessment and plans. She now understands the importance of revascularization and is more willing to entertain surgery.  Time Spent Directly with Patient:  15 minutes  Length of Stay:  LOS: 1 day   Chrystie Nose, MD, [facility] Attending Cardiologist [facility]  HILTY,Kenneth C 10/03/2011, 7:40 AM

## 2011-10-04 ENCOUNTER — Encounter (HOSPITAL_COMMUNITY)
Admission: RE | Disposition: A | Discharge status: Skilled Nursing Facility | Payer: Self-pay | Source: Ambulatory Visit | Attending: Surgery

## 2011-10-04 ENCOUNTER — Inpatient Hospital Stay (HOSPITAL_COMMUNITY): Payer: BC Managed Care – PPO

## 2011-10-04 ENCOUNTER — Observation Stay (HOSPITAL_COMMUNITY): Payer: BC Managed Care – PPO | Admitting: *Deleted

## 2011-10-04 ENCOUNTER — Other Ambulatory Visit: Payer: Self-pay

## 2011-10-04 ENCOUNTER — Encounter (HOSPITAL_COMMUNITY): Payer: Self-pay | Admitting: *Deleted

## 2011-10-04 HISTORY — PX: CORONARY ARTERY BYPASS GRAFT: SHX141

## 2011-10-04 LAB — POCT I-STAT 3, ART BLOOD GAS (G3+)
Acid-Base Excess: 1 mmol/L (ref 0.0–2.0)
Acid-base deficit: 1 mmol/L (ref 0.0–2.0)
Acid-base deficit: 1 mmol/L (ref 0.0–2.0)
Acid-base deficit: 3 mmol/L — ABNORMAL HIGH (ref 0.0–2.0)
Acid-base deficit: 4 mmol/L — ABNORMAL HIGH (ref 0.0–2.0)
Acid-base deficit: 4 mmol/L — ABNORMAL HIGH (ref 0.0–2.0)
Bicarbonate: 24.9 mEq/L — ABNORMAL HIGH (ref 20.0–24.0)
Bicarbonate: 23.4 mEq/L (ref 20.0–24.0)
Bicarbonate: 22.6 mEq/L (ref 20.0–24.0)
Bicarbonate: 21.6 mEq/L (ref 20.0–24.0)
Bicarbonate: 21.5 mEq/L (ref 20.0–24.0)
Bicarbonate: 21.9 mEq/L (ref 20.0–24.0)
O2 Saturation: 100 %
O2 Saturation: 100 %
O2 Saturation: 100 %
O2 Saturation: 98 %
O2 Saturation: 98 %
O2 Saturation: 98 %
Patient temperature: 34.8
Patient temperature: 37.7
Patient temperature: 37.8
TCO2: 26 mmol/L (ref 0–100)
TCO2: 25 mmol/L (ref 0–100)
TCO2: 24 mmol/L (ref 0–100)
TCO2: 23 mmol/L (ref 0–100)
TCO2: 23 mmol/L (ref 0–100)
TCO2: 23 mmol/L (ref 0–100)
pCO2 arterial: 36.3 mmHg (ref 35.0–45.0)
pCO2 arterial: 35.1 mmHg (ref 35.0–45.0)
pCO2 arterial: 33.6 mmHg — ABNORMAL LOW (ref 35.0–45.0)
pCO2 arterial: 32.9 mmHg — ABNORMAL LOW (ref 35.0–45.0)
pCO2 arterial: 43.8 mmHg (ref 35.0–45.0)
pCO2 arterial: 45.4 mmHg — ABNORMAL HIGH (ref 35.0–45.0)
pH, Arterial: 7.445 (ref 7.350–7.450)
pH, Arterial: 7.433 (ref 7.350–7.450)
pH, Arterial: 7.436 (ref 7.350–7.450)
pH, Arterial: 7.417 (ref 7.350–7.450)
pH, Arterial: 7.303 — ABNORMAL LOW (ref 7.350–7.450)
pH, Arterial: 7.296 — ABNORMAL LOW (ref 7.350–7.450)
pO2, Arterial: 522 mmHg — ABNORMAL HIGH (ref 80.0–100.0)
pO2, Arterial: 345 mmHg — ABNORMAL HIGH (ref 80.0–100.0)
pO2, Arterial: 314 mmHg — ABNORMAL HIGH (ref 80.0–100.0)
pO2, Arterial: 99 mmHg (ref 80.0–100.0)
pO2, Arterial: 126 mmHg — ABNORMAL HIGH (ref 80.0–100.0)
pO2, Arterial: 125 mmHg — ABNORMAL HIGH (ref 80.0–100.0)

## 2011-10-04 LAB — POCT I-STAT 4, (NA,K, GLUC, HGB,HCT)
Glucose, Bld: 99 mg/dL (ref 70–99)
Glucose, Bld: 117 mg/dL — ABNORMAL HIGH (ref 70–99)
Glucose, Bld: 88 mg/dL (ref 70–99)
Glucose, Bld: 126 mg/dL — ABNORMAL HIGH (ref 70–99)
Glucose, Bld: 115 mg/dL — ABNORMAL HIGH (ref 70–99)
Glucose, Bld: 100 mg/dL — ABNORMAL HIGH (ref 70–99)
HCT: 34 % — ABNORMAL LOW (ref 36.0–46.0)
HCT: 20 % — ABNORMAL LOW (ref 36.0–46.0)
HCT: 34 % — ABNORMAL LOW (ref 36.0–46.0)
HCT: 27 % — ABNORMAL LOW (ref 36.0–46.0)
HCT: 26 % — ABNORMAL LOW (ref 36.0–46.0)
HCT: 32 % — ABNORMAL LOW (ref 36.0–46.0)
Hemoglobin: 11.6 g/dL — ABNORMAL LOW (ref 12.0–15.0)
Hemoglobin: 11.6 g/dL — ABNORMAL LOW (ref 12.0–15.0)
Hemoglobin: 6.8 g/dL — CL (ref 12.0–15.0)
Hemoglobin: 9.2 g/dL — ABNORMAL LOW (ref 12.0–15.0)
Hemoglobin: 8.8 g/dL — ABNORMAL LOW (ref 12.0–15.0)
Hemoglobin: 10.9 g/dL — ABNORMAL LOW (ref 12.0–15.0)
Potassium: 3.6 mEq/L (ref 3.5–5.1)
Potassium: 3.4 mEq/L — ABNORMAL LOW (ref 3.5–5.1)
Potassium: 3.8 mEq/L (ref 3.5–5.1)
Potassium: 4.2 mEq/L (ref 3.5–5.1)
Potassium: 3.8 mEq/L (ref 3.5–5.1)
Potassium: 3.5 mEq/L (ref 3.5–5.1)
Sodium: 137 mEq/L (ref 135–145)
Sodium: 134 mEq/L — ABNORMAL LOW (ref 135–145)
Sodium: 137 mEq/L (ref 135–145)
Sodium: 138 mEq/L (ref 135–145)
Sodium: 139 mEq/L (ref 135–145)
Sodium: 140 mEq/L (ref 135–145)

## 2011-10-04 LAB — PROTIME-INR
INR: 0.96 (ref 0.00–1.49)
INR: 1.61 — ABNORMAL HIGH (ref 0.00–1.49)
Prothrombin Time: 13 seconds (ref 11.6–15.2)
Prothrombin Time: 19.4 seconds — ABNORMAL HIGH (ref 11.6–15.2)

## 2011-10-04 LAB — GLUCOSE, CAPILLARY
Glucose-Capillary: 101 mg/dL — ABNORMAL HIGH (ref 70–99)
Glucose-Capillary: 102 mg/dL — ABNORMAL HIGH (ref 70–99)
Glucose-Capillary: 98 mg/dL (ref 70–99)
Glucose-Capillary: 123 mg/dL — ABNORMAL HIGH (ref 70–99)
Glucose-Capillary: 112 mg/dL — ABNORMAL HIGH (ref 70–99)

## 2011-10-04 LAB — CBC
HCT: 37.3 % (ref 36.0–46.0)
HCT: 31.6 % — ABNORMAL LOW (ref 36.0–46.0)
HCT: 33.1 % — ABNORMAL LOW (ref 36.0–46.0)
Hemoglobin: 12.7 g/dL (ref 12.0–15.0)
Hemoglobin: 10.9 g/dL — ABNORMAL LOW (ref 12.0–15.0)
Hemoglobin: 11.5 g/dL — ABNORMAL LOW (ref 12.0–15.0)
MCH: 30.3 pg (ref 26.0–34.0)
MCH: 29.9 pg (ref 26.0–34.0)
MCH: 30.5 pg (ref 26.0–34.0)
MCHC: 34 g/dL (ref 30.0–36.0)
MCHC: 34.5 g/dL (ref 30.0–36.0)
MCHC: 34.7 g/dL (ref 30.0–36.0)
MCV: 89 fL (ref 78.0–100.0)
MCV: 86.8 fL (ref 78.0–100.0)
MCV: 87.8 fL (ref 78.0–100.0)
Platelets: 257 10*3/uL (ref 150–400)
Platelets: 184 10*3/uL (ref 150–400)
Platelets: 200 10*3/uL (ref 150–400)
RBC: 4.19 MIL/uL (ref 3.87–5.11)
RBC: 3.64 MIL/uL — ABNORMAL LOW (ref 3.87–5.11)
RBC: 3.77 MIL/uL — ABNORMAL LOW (ref 3.87–5.11)
RDW: 15.8 % — ABNORMAL HIGH (ref 11.5–15.5)
RDW: 15.2 % (ref 11.5–15.5)
RDW: 15.8 % — ABNORMAL HIGH (ref 11.5–15.5)
WBC: 6.5 10*3/uL (ref 4.0–10.5)
WBC: 11.5 10*3/uL — ABNORMAL HIGH (ref 4.0–10.5)
WBC: 10.8 10*3/uL — ABNORMAL HIGH (ref 4.0–10.5)

## 2011-10-04 LAB — POCT I-STAT, CHEM 8
BUN: 7 mg/dL (ref 6–23)
Calcium, Ion: 1.1 mmol/L — ABNORMAL LOW (ref 1.13–1.30)
Chloride: 109 mEq/L (ref 96–112)
Creatinine, Ser: 0.7 mg/dL (ref 0.50–1.10)
Glucose, Bld: 121 mg/dL — ABNORMAL HIGH (ref 70–99)
HCT: 33 % — ABNORMAL LOW (ref 36.0–46.0)
Hemoglobin: 11.2 g/dL — ABNORMAL LOW (ref 12.0–15.0)
Potassium: 4.3 mEq/L (ref 3.5–5.1)
Sodium: 144 mEq/L (ref 135–145)
TCO2: 20 mmol/L (ref 0–100)

## 2011-10-04 LAB — BASIC METABOLIC PANEL
BUN: 14 mg/dL (ref 6–23)
CO2: 25 mEq/L (ref 19–32)
Calcium: 9.5 mg/dL (ref 8.4–10.5)
Chloride: 101 mEq/L (ref 96–112)
Creatinine, Ser: 0.68 mg/dL (ref 0.50–1.10)
GFR calc Af Amer: >90 mL/min (ref >90)
GFR calc non Af Amer: 82 mL/min — ABNORMAL LOW (ref >90)
Glucose, Bld: 90 mg/dL (ref 70–99)
Potassium: 4.1 mEq/L (ref 3.5–5.1)
Sodium: 136 mEq/L (ref 135–145)

## 2011-10-04 LAB — SURGICAL PCR SCREEN
MRSA, PCR: NEGATIVE
Staphylococcus aureus: NEGATIVE

## 2011-10-04 LAB — CREATININE, SERUM
Creatinine, Ser: 0.62 mg/dL (ref 0.50–1.10)
GFR calc Af Amer: >90 mL/min (ref >90)
GFR calc non Af Amer: 85 mL/min — ABNORMAL LOW (ref >90)

## 2011-10-04 LAB — APTT
aPTT: 32 seconds (ref 24–37)
aPTT: 38 seconds — ABNORMAL HIGH (ref 24–37)

## 2011-10-04 LAB — HEMOGLOBIN AND HEMATOCRIT, BLOOD
HCT: 26.6 % — ABNORMAL LOW (ref 36.0–46.0)
Hemoglobin: 9.3 g/dL — ABNORMAL LOW (ref 12.0–15.0)

## 2011-10-04 LAB — MAGNESIUM: Magnesium: 3.6 mg/dL — ABNORMAL HIGH (ref 1.5–2.5)

## 2011-10-04 LAB — PLATELET COUNT: Platelets: 92 10*3/uL — ABNORMAL LOW (ref 150–400)

## 2011-10-04 SURGERY — CORONARY ARTERY BYPASS GRAFTING (CABG)
Anesthesia: General | Site: Chest | Wound class: Clean

## 2011-10-04 MED ORDER — MIDAZOLAM HCL 2 MG/2ML IJ SOLN: 2.0000 mg | INTRAMUSCULAR | Status: DC | PRN

## 2011-10-04 MED ORDER — ACETAMINOPHEN 160 MG/5ML PO SOLN: 650.0000 mg | ORAL | Status: AC

## 2011-10-04 MED ORDER — INSULIN REGULAR HUMAN 100 UNIT/ML IJ SOLN
INTRAMUSCULAR | Status: DC
  Filled 2011-10-04: qty 1

## 2011-10-04 MED ORDER — ASPIRIN EC 325 MG PO TBEC
325.0000 mg | DELAYED_RELEASE_TABLET | Freq: Every day | ORAL | Status: DC
  Administered 2011-10-05 – 2011-10-08 (×4): 325 mg via ORAL
  Filled 2011-10-04 (×4): qty 1

## 2011-10-04 MED ORDER — LACTATED RINGERS IV SOLN: INTRAVENOUS | Status: DC

## 2011-10-04 MED ORDER — DEXMEDETOMIDINE HCL IN NACL 200 MCG/50ML IV SOLN: 0.1000 ug/kg/h | INTRAVENOUS | Status: DC

## 2011-10-04 MED ORDER — OXYCODONE HCL 5 MG PO TABS
5.0000 mg | ORAL_TABLET | ORAL | Status: DC | PRN
  Administered 2011-10-05 – 2011-10-06 (×3): 10 mg via ORAL
  Filled 2011-10-04 (×3): qty 2

## 2011-10-04 MED ORDER — SODIUM CHLORIDE 0.9 % IJ SOLN
3.0000 mL | INTRAMUSCULAR | Status: DC | PRN
  Administered 2011-10-05: 3 mL via INTRAVENOUS

## 2011-10-04 MED ORDER — LACTATED RINGERS IV SOLN
INTRAVENOUS | Status: DC | PRN
  Administered 2011-10-04 (×2): via INTRAVENOUS

## 2011-10-04 MED ORDER — ASPIRIN 81 MG PO CHEW: 324.0000 mg | CHEWABLE_TABLET | Freq: Every day | ORAL | Status: DC

## 2011-10-04 MED ORDER — MAGNESIUM SULFATE 40 MG/ML IJ SOLN
4.0000 g | Freq: Once | INTRAMUSCULAR | Status: AC
  Administered 2011-10-04: 4 g via INTRAVENOUS
  Filled 2011-10-04: qty 100

## 2011-10-04 MED ORDER — SODIUM CHLORIDE 0.9 % IJ SOLN
3.0000 mL | Freq: Two times a day (BID) | INTRAMUSCULAR | Status: DC
  Administered 2011-10-05 – 2011-10-06 (×3): 3 mL via INTRAVENOUS

## 2011-10-04 MED ORDER — METOPROLOL TARTRATE 1 MG/ML IV SOLN: 2.5000 mg | INTRAVENOUS | Status: DC | PRN

## 2011-10-04 MED ORDER — VECURONIUM BROMIDE 10 MG IV SOLR
INTRAVENOUS | Status: DC | PRN
  Administered 2011-10-04: 5 mg via INTRAVENOUS
  Administered 2011-10-04: 10 mg via INTRAVENOUS

## 2011-10-04 MED ORDER — MIDAZOLAM HCL 5 MG/5ML IJ SOLN
INTRAMUSCULAR | Status: DC | PRN
  Administered 2011-10-04: 4 mg via INTRAVENOUS
  Administered 2011-10-04: 2 mg via INTRAVENOUS
  Administered 2011-10-04: 1 mg via INTRAVENOUS
  Administered 2011-10-04: 4 mg via INTRAVENOUS
  Administered 2011-10-04: 2 mg via INTRAVENOUS
  Administered 2011-10-04: 1 mg via INTRAVENOUS

## 2011-10-04 MED ORDER — NITROGLYCERIN IN D5W 200-5 MCG/ML-% IV SOLN: 0.0000 ug/min | INTRAVENOUS | Status: DC

## 2011-10-04 MED ORDER — DEXTROSE 5 % IV SOLN
1.5000 g | Freq: Two times a day (BID) | INTRAVENOUS | Status: AC
  Administered 2011-10-04 – 2011-10-06 (×4): 1.5 g via INTRAVENOUS
  Filled 2011-10-04 (×4): qty 1.5

## 2011-10-04 MED ORDER — PHENYLEPHRINE HCL 10 MG/ML IJ SOLN
0.0000 ug/min | INTRAVENOUS | Status: DC
  Filled 2011-10-04: qty 2

## 2011-10-04 MED ORDER — ONDANSETRON HCL 4 MG/2ML IJ SOLN
4.0000 mg | Freq: Four times a day (QID) | INTRAMUSCULAR | Status: DC | PRN
  Administered 2011-10-05 – 2011-10-06 (×2): 4 mg via INTRAVENOUS
  Filled 2011-10-04 (×2): qty 2

## 2011-10-04 MED ORDER — ACETAMINOPHEN 500 MG PO TABS
1000.0000 mg | ORAL_TABLET | Freq: Four times a day (QID) | ORAL | Status: DC
Start: 2011-10-04 — End: 2011-10-08
  Administered 2011-10-05 – 2011-10-08 (×13): 1000 mg via ORAL
  Filled 2011-10-04 (×18): qty 2

## 2011-10-04 MED ORDER — 0.9 % SODIUM CHLORIDE (POUR BTL) OPTIME
TOPICAL | Status: DC | PRN
  Administered 2011-10-04: 6000 mL

## 2011-10-04 MED ORDER — HEMOSTATIC AGENTS (NO CHARGE) OPTIME
TOPICAL | Status: DC | PRN
  Administered 2011-10-04: 1 via TOPICAL

## 2011-10-04 MED ORDER — PANTOPRAZOLE SODIUM 40 MG PO TBEC
40.0000 mg | DELAYED_RELEASE_TABLET | Freq: Every day | ORAL | Status: DC
  Administered 2011-10-06 – 2011-10-08 (×3): 40 mg via ORAL
  Filled 2011-10-04 (×3): qty 1

## 2011-10-04 MED ORDER — ETOMIDATE 2 MG/ML IV SOLN
INTRAVENOUS | Status: DC | PRN
  Administered 2011-10-04: 20 mg via INTRAVENOUS

## 2011-10-04 MED ORDER — MORPHINE SULFATE 2 MG/ML IJ SOLN
2.0000 mg | INTRAMUSCULAR | Status: DC | PRN
  Administered 2011-10-04 – 2011-10-05 (×5): 2 mg via INTRAVENOUS
  Filled 2011-10-04 (×5): qty 1

## 2011-10-04 MED ORDER — INSULIN ASPART 100 UNIT/ML ~~LOC~~ SOLN
0.0000 [IU] | SUBCUTANEOUS | Status: AC
  Administered 2011-10-04: 2 [IU] via SUBCUTANEOUS

## 2011-10-04 MED ORDER — POTASSIUM CHLORIDE 10 MEQ/50ML IV SOLN
10.0000 meq | INTRAVENOUS | Status: AC
  Administered 2011-10-04 (×3): 10 meq via INTRAVENOUS

## 2011-10-04 MED ORDER — CYCLOSPORINE 0.05 % OP EMUL
1.0000 [drp] | Freq: Two times a day (BID) | OPHTHALMIC | Status: DC
  Administered 2011-10-05: 1 [drp] via OPHTHALMIC
  Administered 2011-10-05: 21:00:00 via OPHTHALMIC
  Administered 2011-10-06 – 2011-10-08 (×5): 1 [drp] via OPHTHALMIC
  Filled 2011-10-04 (×8): qty 1

## 2011-10-04 MED ORDER — ACETAMINOPHEN 650 MG RE SUPP
650.0000 mg | RECTAL | Status: AC
  Administered 2011-10-04: 650 mg via RECTAL

## 2011-10-04 MED ORDER — METOPROLOL TARTRATE 12.5 MG HALF TABLET
12.5000 mg | ORAL_TABLET | Freq: Two times a day (BID) | ORAL | Status: DC
  Administered 2011-10-05 – 2011-10-07 (×5): 12.5 mg via ORAL
  Filled 2011-10-04 (×9): qty 1

## 2011-10-04 MED ORDER — PROTAMINE SULFATE 10 MG/ML IV SOLN
INTRAVENOUS | Status: DC | PRN
  Administered 2011-10-04: 170 mg via INTRAVENOUS

## 2011-10-04 MED ORDER — VANCOMYCIN HCL IN DEXTROSE 1-5 GM/200ML-% IV SOLN
1000.0000 mg | Freq: Once | INTRAVENOUS | Status: AC
  Administered 2011-10-04: 1000 mg via INTRAVENOUS
  Filled 2011-10-04: qty 200

## 2011-10-04 MED ORDER — DOCUSATE SODIUM 100 MG PO CAPS
200.0000 mg | ORAL_CAPSULE | Freq: Every day | ORAL | Status: DC
  Administered 2011-10-06 – 2011-10-08 (×3): 200 mg via ORAL
  Filled 2011-10-04 (×3): qty 2

## 2011-10-04 MED ORDER — THROMBIN 20000 UNITS EX SOLR
CUTANEOUS | Status: AC
  Filled 2011-10-04: qty 20000

## 2011-10-04 MED ORDER — LACTATED RINGERS IV SOLN
INTRAVENOUS | Status: DC | PRN
  Administered 2011-10-04: 08:00:00 via INTRAVENOUS

## 2011-10-04 MED ORDER — MORPHINE SULFATE 2 MG/ML IJ SOLN: 1.0000 mg | INTRAMUSCULAR | Status: AC | PRN

## 2011-10-04 MED ORDER — ALBUMIN HUMAN 5 % IV SOLN
INTRAVENOUS | Status: AC
  Filled 2011-10-04: qty 250

## 2011-10-04 MED ORDER — INSULIN ASPART 100 UNIT/ML ~~LOC~~ SOLN
0.0000 [IU] | SUBCUTANEOUS | Status: DC
  Administered 2011-10-05 (×3): 2 [IU] via SUBCUTANEOUS

## 2011-10-04 MED ORDER — ACETAMINOPHEN 160 MG/5ML PO SOLN
975.0000 mg | Freq: Four times a day (QID) | ORAL | Status: DC
  Administered 2011-10-05: 975 mg
  Filled 2011-10-04: qty 40.6

## 2011-10-04 MED ORDER — SODIUM CHLORIDE 0.9 % IV SOLN: INTRAVENOUS | Status: DC

## 2011-10-04 MED ORDER — ALBUMIN HUMAN 5 % IV SOLN
250.0000 mL | INTRAVENOUS | Status: AC | PRN
  Administered 2011-10-04 (×2): 250 mL via INTRAVENOUS
  Filled 2011-10-04: qty 250

## 2011-10-04 MED ORDER — SODIUM CHLORIDE 0.9 % IV SOLN: 250.0000 mL | INTRAVENOUS | Status: DC

## 2011-10-04 MED ORDER — BISACODYL 10 MG RE SUPP
10.0000 mg | Freq: Every day | RECTAL | Status: DC
  Administered 2011-10-08: 10 mg via RECTAL
  Filled 2011-10-04: qty 1

## 2011-10-04 MED ORDER — INSULIN REGULAR BOLUS VIA INFUSION
0.0000 [IU] | Freq: Three times a day (TID) | INTRAVENOUS | Status: DC
  Filled 2011-10-04: qty 10

## 2011-10-04 MED ORDER — FAMOTIDINE IN NACL 20-0.9 MG/50ML-% IV SOLN
20.0000 mg | Freq: Two times a day (BID) | INTRAVENOUS | Status: DC
  Administered 2011-10-04: 20 mg via INTRAVENOUS

## 2011-10-04 MED ORDER — LACTATED RINGERS IV SOLN: 500.0000 mL | Freq: Once | INTRAVENOUS | Status: AC | PRN

## 2011-10-04 MED ORDER — BISACODYL 5 MG PO TBEC
10.0000 mg | DELAYED_RELEASE_TABLET | Freq: Every day | ORAL | Status: DC
  Administered 2011-10-06 – 2011-10-07 (×2): 10 mg via ORAL
  Filled 2011-10-04: qty 2

## 2011-10-04 MED ORDER — SODIUM CHLORIDE 0.45 % IV SOLN
INTRAVENOUS | Status: DC
  Administered 2011-10-05: 11:00:00 via INTRAVENOUS

## 2011-10-04 MED ORDER — HEPARIN SODIUM (PORCINE) 1000 UNIT/ML IJ SOLN
INTRAMUSCULAR | Status: AC
  Filled 2011-10-04: qty 1

## 2011-10-04 MED ORDER — THROMBIN 20000 UNITS EX SOLR
OROMUCOSAL | Status: DC | PRN
  Administered 2011-10-04: 09:00:00 via TOPICAL

## 2011-10-04 MED ORDER — FENTANYL CITRATE 0.05 MG/ML IJ SOLN
INTRAMUSCULAR | Status: DC | PRN
  Administered 2011-10-04: 50 ug via INTRAVENOUS
  Administered 2011-10-04: 100 ug via INTRAVENOUS
  Administered 2011-10-04: 50 ug via INTRAVENOUS
  Administered 2011-10-04: 100 ug via INTRAVENOUS
  Administered 2011-10-04 (×2): 50 ug via INTRAVENOUS
  Administered 2011-10-04 (×2): 150 ug via INTRAVENOUS
  Administered 2011-10-04: 100 ug via INTRAVENOUS
  Administered 2011-10-04: 50 ug via INTRAVENOUS
  Administered 2011-10-04: 100 ug via INTRAVENOUS

## 2011-10-04 MED ORDER — METOPROLOL TARTRATE 25 MG/10 ML ORAL SUSPENSION
12.5000 mg | Freq: Two times a day (BID) | ORAL | Status: DC
  Administered 2011-10-05: 12.5 mg
  Filled 2011-10-04 (×5): qty 5

## 2011-10-04 MED ORDER — HEPARIN SODIUM (PORCINE) 1000 UNIT/ML IJ SOLN
INTRAMUSCULAR | Status: DC | PRN
  Administered 2011-10-04: 13000 [IU] via INTRAVENOUS

## 2011-10-04 MED ORDER — PHENYLEPHRINE HCL 10 MG/ML IJ SOLN
10.0000 mg | INTRAVENOUS | Status: DC | PRN
  Administered 2011-10-04 (×2): 1 ug/min via INTRAVENOUS

## 2011-10-04 SURGICAL SUPPLY — 103 items
ATTRACTOMAT 16X20 MAGNETIC DRP (DRAPES) ×2 IMPLANT
BAG DECANTER FOR FLEXI CONT (MISCELLANEOUS) ×2 IMPLANT
BANDAGE ELASTIC 4 VELCRO ST LF (GAUZE/BANDAGES/DRESSINGS) ×2 IMPLANT
BANDAGE ELASTIC 6 VELCRO ST LF (GAUZE/BANDAGES/DRESSINGS) ×2 IMPLANT
BANDAGE GAUZE ELAST BULKY 4 IN (GAUZE/BANDAGES/DRESSINGS) ×2 IMPLANT
BASKET HEART (ORDER IN 25'S) (MISCELLANEOUS) ×1
BASKET HEART (ORDER IN 25S) (MISCELLANEOUS) ×1 IMPLANT
BLADE STERNUM SYSTEM 6 (BLADE) ×2 IMPLANT
BLADE SURG ROTATE 9660 (MISCELLANEOUS) ×2 IMPLANT
CANISTER SUCTION 2500CC (MISCELLANEOUS) ×2 IMPLANT
CATH ROBINSON RED A/P 18FR (CATHETERS) ×4 IMPLANT
CATH THORACIC 28FR (CATHETERS) ×4 IMPLANT
CATH THORACIC 28FR RT ANG (CATHETERS) IMPLANT
CATH THORACIC 36FR (CATHETERS) ×2 IMPLANT
CATH THORACIC 36FR RT ANG (CATHETERS) ×2 IMPLANT
CLIP TI MEDIUM 24 (CLIP) IMPLANT
CLIP TI WIDE RED SMALL 24 (CLIP) ×2 IMPLANT
CLOTH BEACON ORANGE TIMEOUT ST (SAFETY) ×2 IMPLANT
COVER SURGICAL LIGHT HANDLE (MISCELLANEOUS) ×4 IMPLANT
CRADLE DONUT ADULT HEAD (MISCELLANEOUS) ×2 IMPLANT
DERMABOND ADVANCED (GAUZE/BANDAGES/DRESSINGS) ×1
DERMABOND ADVANCED .7 DNX12 (GAUZE/BANDAGES/DRESSINGS) ×1 IMPLANT
DRAPE CARDIOVASCULAR INCISE (DRAPES) ×1
DRAPE SLUSH MACHINE 52X66 (DRAPES) IMPLANT
DRAPE SLUSH/WARMER DISC (DRAPES) IMPLANT
DRAPE SRG 135X102X78XABS (DRAPES) ×1 IMPLANT
DRSG COVADERM 4X14 (GAUZE/BANDAGES/DRESSINGS) ×2 IMPLANT
ELECT CAUTERY BLADE 6.4 (BLADE) ×2 IMPLANT
ELECT REM PT RETURN 9FT ADLT (ELECTROSURGICAL) ×4
ELECTRODE REM PT RTRN 9FT ADLT (ELECTROSURGICAL) ×2 IMPLANT
GLOVE BIO SURGEON STRL SZ 6 (GLOVE) ×6 IMPLANT
GLOVE BIO SURGEON STRL SZ 6.5 (GLOVE) IMPLANT
GLOVE BIO SURGEON STRL SZ7 (GLOVE) IMPLANT
GLOVE BIO SURGEON STRL SZ7.5 (GLOVE) ×4 IMPLANT
GLOVE BIOGEL PI IND STRL 6 (GLOVE) ×2 IMPLANT
GLOVE BIOGEL PI IND STRL 6.5 (GLOVE) IMPLANT
GLOVE BIOGEL PI IND STRL 7.0 (GLOVE) ×6 IMPLANT
GLOVE BIOGEL PI INDICATOR 6 (GLOVE) ×2
GLOVE BIOGEL PI INDICATOR 6.5 (GLOVE)
GLOVE BIOGEL PI INDICATOR 7.0 (GLOVE) ×6
GLOVE EUDERMIC 7 POWDERFREE (GLOVE) ×4 IMPLANT
GLOVE ORTHO TXT STRL SZ7.5 (GLOVE) IMPLANT
GOWN PREVENTION PLUS XLARGE (GOWN DISPOSABLE) ×6 IMPLANT
GOWN STRL NON-REIN LRG LVL3 (GOWN DISPOSABLE) ×10 IMPLANT
HEMOSTAT POWDER SURGIFOAM 1G (HEMOSTASIS) ×6 IMPLANT
HEMOSTAT SURGICEL 2X14 (HEMOSTASIS) ×2 IMPLANT
INSERT FOGARTY 61MM (MISCELLANEOUS) IMPLANT
INSERT FOGARTY XLG (MISCELLANEOUS) IMPLANT
KIT BASIN OR (CUSTOM PROCEDURE TRAY) ×2 IMPLANT
KIT CATH CPB BARTLE (MISCELLANEOUS) ×2 IMPLANT
KIT ROOM TURNOVER OR (KITS) ×2 IMPLANT
KIT SUCTION CATH 14FR (SUCTIONS) ×2 IMPLANT
KIT VASOVIEW W/TROCAR VH 2000 (KITS) ×2 IMPLANT
NEEDLE 18GX1X1/2 (RX/OR ONLY) (NEEDLE) ×2 IMPLANT
NS IRRIG 1000ML POUR BTL (IV SOLUTION) ×12 IMPLANT
PACK OPEN HEART (CUSTOM PROCEDURE TRAY) ×2 IMPLANT
PAD ARMBOARD 7.5X6 YLW CONV (MISCELLANEOUS) ×4 IMPLANT
PENCIL BUTTON HOLSTER BLD 10FT (ELECTRODE) ×2 IMPLANT
PUNCH AORTIC ROTATE 4.0MM (MISCELLANEOUS) IMPLANT
PUNCH AORTIC ROTATE 4.5MM 8IN (MISCELLANEOUS) ×2 IMPLANT
PUNCH AORTIC ROTATE 5MM 8IN (MISCELLANEOUS) IMPLANT
SPONGE GAUZE 4X4 12PLY (GAUZE/BANDAGES/DRESSINGS) ×4 IMPLANT
SPONGE INTESTINAL PEANUT (DISPOSABLE) IMPLANT
SPONGE LAP 18X18 X RAY DECT (DISPOSABLE) IMPLANT
SPONGE LAP 4X18 X RAY DECT (DISPOSABLE) ×2 IMPLANT
SUT BONE WAX W31G (SUTURE) ×2 IMPLANT
SUT MNCRL AB 4-0 PS2 18 (SUTURE) IMPLANT
SUT PROLENE 3 0 SH DA (SUTURE) IMPLANT
SUT PROLENE 3 0 SH1 36 (SUTURE) ×2 IMPLANT
SUT PROLENE 4 0 RB 1 (SUTURE)
SUT PROLENE 4 0 SH DA (SUTURE) IMPLANT
SUT PROLENE 4-0 RB1 .5 CRCL 36 (SUTURE) IMPLANT
SUT PROLENE 5 0 C 1 36 (SUTURE) IMPLANT
SUT PROLENE 6 0 C 1 30 (SUTURE) ×2 IMPLANT
SUT PROLENE 7 0 BV 1 (SUTURE) IMPLANT
SUT PROLENE 7 0 BV1 MDA (SUTURE) ×2 IMPLANT
SUT PROLENE 8 0 BV175 6 (SUTURE) ×2 IMPLANT
SUT SILK  1 MH (SUTURE) ×1
SUT SILK 1 MH (SUTURE) ×1 IMPLANT
SUT STEEL 6MS V (SUTURE) ×4 IMPLANT
SUT STEEL STERNAL CCS#1 18IN (SUTURE) IMPLANT
SUT STEEL SZ 6 DBL 3X14 BALL (SUTURE) IMPLANT
SUT VIC AB 1 CTX 36 (SUTURE) ×3
SUT VIC AB 1 CTX36XBRD ANBCTR (SUTURE) ×3 IMPLANT
SUT VIC AB 2-0 CT1 27 (SUTURE) ×1
SUT VIC AB 2-0 CT1 TAPERPNT 27 (SUTURE) ×1 IMPLANT
SUT VIC AB 2-0 CTX 27 (SUTURE) IMPLANT
SUT VIC AB 3-0 SH 27 (SUTURE)
SUT VIC AB 3-0 SH 27X BRD (SUTURE) IMPLANT
SUT VIC AB 3-0 X1 27 (SUTURE) ×2 IMPLANT
SUT VICRYL 4-0 PS2 18IN ABS (SUTURE) IMPLANT
SUTURE E-PAK OPEN HEART (SUTURE) ×2 IMPLANT
SYRINGE 10CC LL (SYRINGE) ×2 IMPLANT
SYSTEM SAHARA CHEST DRAIN ATS (WOUND CARE) ×4 IMPLANT
TAPE CLOTH SURG 4X10 WHT LF (GAUZE/BANDAGES/DRESSINGS) ×2 IMPLANT
TAPE PAPER 2X10 WHT MICROPORE (GAUZE/BANDAGES/DRESSINGS) ×2 IMPLANT
TOWEL OR 17X24 6PK STRL BLUE (TOWEL DISPOSABLE) ×2 IMPLANT
TOWEL OR 17X26 10 PK STRL BLUE (TOWEL DISPOSABLE) ×2 IMPLANT
TRAY FOLEY IC TEMP SENS 14FR (CATHETERS) ×2 IMPLANT
TUBE SUCT INTRACARD DLP 20F (MISCELLANEOUS) ×2 IMPLANT
TUBING INSUFFLATION 10FT LAP (TUBING) ×2 IMPLANT
UNDERPAD 30X30 INCONTINENT (UNDERPADS AND DIAPERS) ×2 IMPLANT
WATER STERILE IRR 1000ML POUR (IV SOLUTION) ×4 IMPLANT

## 2011-10-04 NOTE — OR Nursing (Signed)
12:00 noon - called front desk to inform pt. Family off pump, also 1st call to unit 2300.   2nd call to SICU @ 12:30pm

## 2011-10-04 NOTE — Procedures (Signed)
Extubation Procedure Note  Patient Details:   Name: Theresa Crosby DOB: 07-11-1932 MRN: [id number]   Airway Documentation:     Evaluation  O2 sats: stable throughout Complications: No apparent complications Patient did tolerate procedure well. Bilateral Breath Sounds: Clear;Diminished Suctioning: Airway Yes  NIF -25, VC 0.4-0.5. Ph was 7.30, Dr. Laneta Simmers aware. Placed on 4 LNC. RT will continue to monitor.  Lurlean Leyden 10/04/2011, 6:24 PM

## 2011-10-04 NOTE — Anesthesia Preprocedure Evaluation (Addendum)
Anesthesia Evaluation  Patient identified by MRN, date of birth, ID band Patient awake    Reviewed: Allergy & Precautions, H&P , NPO status , Patient's Chart, lab work & pertinent test results, reviewed documented beta blocker date and time   Airway Mallampati: II  Neck ROM: full    Dental  (+) Teeth Intact and Dental Advisory Given   Pulmonary          Cardiovascular hypertension, + CAD     Neuro/Psych Anxiety    GI/Hepatic GERD-  Medicated,  Endo/Other    Renal/GU      Musculoskeletal  (+) Arthritis -,   Abdominal   Peds  Hematology   Anesthesia Other Findings   Reproductive/Obstetrics                          Anesthesia Physical Anesthesia Plan  ASA: III  Anesthesia Plan: General   Post-op Pain Management:    Induction: Intravenous  Airway Management Planned: Oral ETT  Additional Equipment: Arterial line, CVP and PA Cath  Intra-op Plan:   Post-operative Plan: Post-operative intubation/ventilation  Informed Consent: I have reviewed the patients History and Physical, chart, labs and discussed the procedure including the risks, benefits and alternatives for the proposed anesthesia with the patient or authorized representative who has indicated his/her understanding and acceptance.   Dental advisory given  Plan Discussed with: CRNA, Surgeon and Anesthesiologist  Anesthesia Plan Comments:        Anesthesia Quick Evaluation

## 2011-10-04 NOTE — Plan of Care (Signed)
Problem: Phase II Progression Outcomes Goal: Patient extubated within - Outcome: Completed/Met Date Met:  10/04/11 Within 6 hours

## 2011-10-04 NOTE — Preoperative (Signed)
Beta Blockers   Reason not to administer Beta Blockers:Not Applicable 

## 2011-10-04 NOTE — Op Note (Signed)
Theresa Crosby, FORNER             ACCOUNT NO.:  [id number]  MEDICAL RECORD NO.:  [id number]  LOCATION:  2308                         FACILITY:  [facility]  PHYSICIAN:  Evelene Croon, M.D.     DATE OF BIRTH:  08/04/1932  DATE OF PROCEDURE:  10/04/2011 DATE OF DISCHARGE:                              OPERATIVE REPORT   PREOPERATIVE DIAGNOSIS:  Severe multivessel coronary disease.  POSTOPERATIVE DIAGNOSIS:  Severe multivessel coronary disease.  OPERATIVE PROCEDURE:  Median sternotomy, extracorporeal circulation, coronary artery bypass graft surgery x4 using a left internal mammary artery graft to the left anterior descending coronary artery, with a saphenous vein graft to diagonal branch of the LAD, a saphenous vein graft to the second obtuse marginal branch of left circumflex coronary artery, and a saphenous vein graft to the right coronary artery. Endoscopic vein harvesting from the right leg.  ATTENDING SURGEON:  Evelene Croon, M.D.  ASSISTANT:  Rowe Clack, PA-C.  ANESTHESIA:  General endotracheal.  CLINICAL HISTORY:  This patient is a 78 year old woman who recently presented with complaints of exertional substernal chest discomfort and shortness of breath as well as exertional fatigue and tiredness.  She underwent a stress test showing mild mid anterior and apical anterior reversible ischemia with normal left ventricular systolic function. Cardiac catheterizations showed severe three-vessel coronary artery disease.  The LAD had a 99% calcified eccentric proximal stenosis at the takeoff of the first diagonal branch.  The first diagonal branch was a moderate-sized vessel with about 80% ostial and proximal disease.  The left circumflex had about 80% marginal stenosis.  The right coronary artery was a dominant vessel with an eccentric midvessel stenosis that was probably at least 50%.  The left ventricular function was normal with EF of 60%.  After review of the catheterization and  examination of the patient, I felt that coronary artery bypass graft surgery is the best treatment to prevent further ischemia and infarction.  I discussed the operative procedure with the patient and her fiance including alternatives, benefits, and risks including, but not limited to, bleeding, blood transfusion, infection, stroke, myocardial infarction, graft failure, and death.  She understood and agreed to proceed.  PROCEDURE:  The patient was taken to the operating room and placed on table in supine position.  After induction of general endotracheal anesthesia, a Foley catheter was placed in bladder using sterile technique.  Then, the chest, abdomen, and both lower extremities were prepped and draped in usual sterile manner.  The chest was entered through a median sternotomy incision.  The pericardium was opened in midline.  Examination of heart showed good ventricular contractility. The ascending aorta was a small caliber vessel and had no palpable plaques in it.  Given her body surface area of 1.4, she had a relatively small heart.  Then, the left internal mammary artery was harvested from chest wall as a pedicle graft.  This was a small caliber vessel with good blood flow through it.  At the same time, a segment of greater saphenous vein was harvested from the right leg using endoscopic vein harvest technique. This vein was a medium size and good quality.  Then, the patient was heparinized when adequate ACT was obtained,  the distal ascending aorta was cannulated using a 20-French aortic cannula for arterial inflow.  Venous outflow was achieved using a two-stage venous cannula for the right atrial appendage.  An antegrade cardioplegia and vent cannula was inserted in aortic root.  The patient was placed on cardiopulmonary bypass and distal coronary artery  identified.  The LAD was intramyocardial throughout most of its course.  It was visible distally near the apex where it  was a tiny vessel.  The diagonal branch was diffusely diseased.  The medial subbranch was a larger and longer vessel.  The lateral subbranch was small and diffusely diseased.  Left circumflex had 2 marginal vessels. The first was relatively small branch and the second was a larger branch.  There was noted distal disease in the second marginal.  The right coronary artery was diffusely diseased with calcific plaque.  Just prior to the takeoff of the posterior descending branch, there was an area that was softer and suitable for grafting, but did have some posterior plaque in it.  Then the aorta was crossclamped and 500 mL of cold blood antegrade cardioplegia was administered in the aortic root with quick arrest of the heart.  Systemic hypothermia to 32 degrees centigrade and topical hypothermic iced saline was used.  A temperature probe was placed in septum insulating pad and pericardium.  The first distal anastomosis was performed to the right coronary artery. The internal diameter was about 2 mm.  The conduit used was a segment of greater saphenous vein and anastomosis performed in an end-to-side manner using continuous 7-0 suture.  Flow was noted through the graft and was excellent.  Second distal anastomosis was performed to the second marginal branch. The internal diameter of this vessel was 1.75 mm.  Conduit used was a second segment of greater saphenous vein and anastomosis performed in an end-to-side manner using continuous 7-0 suture.  Flow was noted through the graft and was excellent.  Then, a dose of cardioplegia was given down vein grafts and aortic root.  The third distal anastomosis was performed to the diagonal branch.  The internal diameter was 1.6 mm.  Conduit used was a third segment of greater saphenous vein and the anastomosis performed in an end-to-side manner using continuous 7-0 suture.  Flow was noted through the graft and was excellent.  Then the LAD was  localized in its midportion within the myocardium beneath the anterior descending vein.  There was no disease in this location and the vessel was a moderate-sized vessel with an internal diameter of 1.6 mm.  The conduit used was a left internal mammary graft and was brought through an opening of left pericardium anterior to the phrenic nerve.  It was anastomosed to the LAD in an end-to-side manner using continuous 8-0 Prolene suture.  The pedicle was sutured to the epicardium with 6-0 Prolene sutures.  With the crossclamp in place, the 3 proximal vein graft anastomoses were performed.  The mid ascending aorta in an end-to-side manner using continuous 6-0 Prolene suture.  Then, the clamp was removed from mammary pedicle.  There was rapid warming of the ventricular septum and return of spontaneous ventricular fibrillation.  The crossclamp was removed with a time of 86 minutes and the patient was defibrillated into a sinus rhythm.  The proximal and distal anastomoses appeared hemostatic and allowed the grafts satisfactory.  Graft markers were placed around the proximal anastomoses.  Two temporary right ventricular and right atrial pacing wires were placed above the skin.  When  the patient rewarmed to 37 degrees centigrade, she was weaned from cardiopulmonary bypass on no inotropic agents.  Total bypass time was 107 minutes.  Cardiac function appeared excellent with a cardiac output of 4 L/minute.  Protamine was given, and the venous and aortic cannulas were removed without difficulty.  Hemostasis was achieved.  The patient was given 1 unit of platelets due to platelet count of 92,000 and received 2 units of packed red blood cells on pump due to low hemoglobin.  After obtaining hemostasis, four chest tubes were placed with bilateral pleural tubes, a tube in the post pericardium, one anterior mediastinum.  The sternum was then closed with #6 stainless steel wires.  The fascia was closed with  continuous 1 Vicryl suture. Subcutaneous tissue was closed with continuous 2-0 Vicryl and the skin with a 3-0 Vicryl subcuticular closure.  The lower extremity vein harvest site was closed in layers in similar manner.  The sponge, needle, instrument counts were correct according to the scrub nurse. Dry sterile dressing applied over the incisions around chest tubes, which were hooked to Pleur-Evac suction.  The patient remained hemodynamically stable and was transferred to the SICU in guarded, but stable condition.     Evelene Croon, M.D.     BB/MEDQ  D:  10/04/2011  T:  10/04/2011  Job:  [id number]  cc:   Nanetta Batty

## 2011-10-04 NOTE — Transfer of Care (Signed)
Immediate Anesthesia Transfer of Care Note  Patient: Theresa Crosby  Procedure(s) Performed: Procedure(s) (LRB): CORONARY ARTERY BYPASS GRAFTING (CABG) (N/A)  Patient Location: SICU  Anesthesia Type: General  Level of Consciousness: sedated  Airway & Oxygen Therapy: Patient remains intubated per anesthesia plan and Patient placed on Ventilator (see vital sign flow sheet for setting)  Post-op Assessment: Report given to PACU RN and Post -op Vital signs reviewed and stable  Post vital signs: Reviewed and stable  Complications: No apparent anesthesia complications

## 2011-10-04 NOTE — Progress Notes (Signed)
Notified Dr. Laneta Simmers of cardiac index of 1.27, MD stated okay to start weaning.

## 2011-10-04 NOTE — Brief Op Note (Signed)
                   [address]            [name] [address]          [phone]    10/02/2011 - 10/04/2011  11:20 AM  PATIENT:  Theresa Crosby  78 y.o. female  PRE-OPERATIVE DIAGNOSIS:  coronary artery disease  POST-OPERATIVE DIAGNOSIS:  coronary artery disease  PROCEDURE:  Procedure(s): CORONARY ARTERY BYPASS GRAFTING (CABG)x4 LIMA-LAD; SVG-OM2; SVG-DIAG; SVG-RCA EVH RIGHT LEG  SURGEON:  Surgeon(s): Alleen Borne, MD  PHYSICIAN ASSISTANT: Edra Riccardi PA-C  ANESTHESIA:   general  PATIENT CONDITION:  ICU - intubated and hemodynamically stable.  PRE-OPERATIVE WEIGHT: 50.7kg  COMPLICATIONS: NO KNOWN

## 2011-10-05 ENCOUNTER — Encounter (HOSPITAL_COMMUNITY): Payer: BC Managed Care – PPO

## 2011-10-05 ENCOUNTER — Encounter (HOSPITAL_COMMUNITY): Payer: Self-pay

## 2011-10-05 ENCOUNTER — Inpatient Hospital Stay (HOSPITAL_COMMUNITY): Payer: BC Managed Care – PPO

## 2011-10-05 DIAGNOSIS — I2510 Atherosclerotic heart disease of native coronary artery without angina pectoris: Secondary | ICD-10-CM

## 2011-10-05 DIAGNOSIS — Z951 Presence of aortocoronary bypass graft: Secondary | ICD-10-CM

## 2011-10-05 DIAGNOSIS — R9439 Abnormal result of other cardiovascular function study: Secondary | ICD-10-CM

## 2011-10-05 DIAGNOSIS — R079 Chest pain, unspecified: Secondary | ICD-10-CM | POA: Diagnosis present

## 2011-10-05 HISTORY — DX: Abnormal result of other cardiovascular function study: R94.39

## 2011-10-05 HISTORY — DX: Atherosclerotic heart disease of native coronary artery without angina pectoris: I25.10

## 2011-10-05 HISTORY — DX: Presence of aortocoronary bypass graft: Z95.1

## 2011-10-05 LAB — GLUCOSE, CAPILLARY
Glucose-Capillary: 104 mg/dL — ABNORMAL HIGH (ref 70–99)
Glucose-Capillary: 112 mg/dL — ABNORMAL HIGH (ref 70–99)
Glucose-Capillary: 121 mg/dL — ABNORMAL HIGH (ref 70–99)
Glucose-Capillary: 130 mg/dL — ABNORMAL HIGH (ref 70–99)
Glucose-Capillary: 141 mg/dL — ABNORMAL HIGH (ref 70–99)

## 2011-10-05 LAB — POCT I-STAT, CHEM 8
BUN: 20 mg/dL (ref 6–23)
Calcium, Ion: 1.2 mmol/L (ref 1.13–1.30)
Chloride: 107 mEq/L (ref 96–112)
Creatinine, Ser: 1.1 mg/dL (ref 0.50–1.10)
Glucose, Bld: 134 mg/dL — ABNORMAL HIGH (ref 70–99)
HCT: 36 % (ref 36.0–46.0)
Hemoglobin: 12.2 g/dL (ref 12.0–15.0)
Potassium: 4.8 mEq/L (ref 3.5–5.1)
Sodium: 139 mEq/L (ref 135–145)
TCO2: 23 mmol/L (ref 0–100)

## 2011-10-05 LAB — BASIC METABOLIC PANEL
BUN: 12 mg/dL (ref 6–23)
CO2: 22 mEq/L (ref 19–32)
Calcium: 7.7 mg/dL — ABNORMAL LOW (ref 8.4–10.5)
Chloride: 110 mEq/L (ref 96–112)
Creatinine, Ser: 0.76 mg/dL (ref 0.50–1.10)
GFR calc Af Amer: >90 mL/min (ref >90)
GFR calc non Af Amer: 79 mL/min — ABNORMAL LOW (ref >90)
Glucose, Bld: 154 mg/dL — ABNORMAL HIGH (ref 70–99)
Potassium: 4.4 mEq/L (ref 3.5–5.1)
Sodium: 140 mEq/L (ref 135–145)

## 2011-10-05 LAB — CBC
HCT: 34 % — ABNORMAL LOW (ref 36.0–46.0)
HCT: 35.5 % — ABNORMAL LOW (ref 36.0–46.0)
Hemoglobin: 11.5 g/dL — ABNORMAL LOW (ref 12.0–15.0)
Hemoglobin: 11.8 g/dL — ABNORMAL LOW (ref 12.0–15.0)
MCH: 30 pg (ref 26.0–34.0)
MCH: 30.5 pg (ref 26.0–34.0)
MCHC: 33.8 g/dL (ref 30.0–36.0)
MCHC: 33.2 g/dL (ref 30.0–36.0)
MCV: 88.8 fL (ref 78.0–100.0)
MCV: 91.7 fL (ref 78.0–100.0)
Platelets: 186 10*3/uL (ref 150–400)
Platelets: 195 10*3/uL (ref 150–400)
RBC: 3.83 MIL/uL — ABNORMAL LOW (ref 3.87–5.11)
RBC: 3.87 MIL/uL (ref 3.87–5.11)
RDW: 16.3 % — ABNORMAL HIGH (ref 11.5–15.5)
RDW: 17.1 % — ABNORMAL HIGH (ref 11.5–15.5)
WBC: 11.8 10*3/uL — ABNORMAL HIGH (ref 4.0–10.5)
WBC: 12.6 10*3/uL — ABNORMAL HIGH (ref 4.0–10.5)

## 2011-10-05 LAB — CREATININE, SERUM
Creatinine, Ser: 1.01 mg/dL (ref 0.50–1.10)
GFR calc Af Amer: 61 mL/min — ABNORMAL LOW (ref >90)
GFR calc non Af Amer: 52 mL/min — ABNORMAL LOW (ref >90)

## 2011-10-05 LAB — PREPARE PLATELET PHERESIS: Unit division: 0

## 2011-10-05 LAB — MAGNESIUM
Magnesium: 3.3 mg/dL — ABNORMAL HIGH (ref 1.5–2.5)
Magnesium: 3.3 mg/dL — ABNORMAL HIGH (ref 1.5–2.5)

## 2011-10-05 MED ORDER — FUROSEMIDE 10 MG/ML IJ SOLN
40.0000 mg | Freq: Once | INTRAMUSCULAR | Status: AC
  Administered 2011-10-05: 40 mg via INTRAVENOUS

## 2011-10-05 MED ORDER — POTASSIUM CHLORIDE CRYS ER 20 MEQ PO TBCR: 20.0000 meq | EXTENDED_RELEASE_TABLET | Freq: Once | ORAL | Status: DC

## 2011-10-05 MED FILL — Potassium Chloride Inj 2 mEq/ML: INTRAVENOUS | Qty: 40 | Status: AC

## 2011-10-05 MED FILL — Magnesium Sulfate Inj 50%: INTRAMUSCULAR | Qty: 10 | Status: AC

## 2011-10-05 NOTE — Progress Notes (Signed)
Subjective: Resting in bed, extubated. Having incisional pain currently, just medicated.  Objective: Vital signs in last 24 hours: Temp:  [94.6 F (34.8 C)-100.2 F (37.9 C)] 98.1 F (36.7 C) (08/19 1000) Pulse Rate:  [80-93] 86  (08/19 1000) Resp:  [7-31] 24  (08/19 1000) BP: (73-116)/(41-75) 105/64 mmHg (08/19 1000) SpO2:  [95 %-100 %] 99 % (08/19 1000) Arterial Line BP: (89-147)/(43-84) 137/63 mmHg (08/19 1000) FiO2 (%):  [40 %-50 %] 40 % (08/18 1815) Weight:  [58.2 kg (128 lb 4.9 oz)] 58.2 kg (128 lb 4.9 oz) (08/19 0500) Weight change:    Intake/Output from previous day: +2289 08/18 0701 - 08/19 0700 In: 6109.7 [P.O.:150; I.V.:4297.7; Blood:612; IV Piggyback:1050] Out: 3795 [Urine:2555; Blood:600; Chest Tube:640] Intake/Output this shift: Total I/O In: 142 [I.V.:140; IV Piggyback:2] Out: 155 [Urine:45; Chest Tube:110]  PE: General:alert and oriented X 3, follows commands Skin:cool and dry Neck:no JVD Heart:S1S2 RRR soft rub Lungs:clear ant. Abd:+ BS, soft, non tender Ext:no edema, cool to touch bil.    Hemodynamic parameters for last 24 hours:  PAP: (21-36)/(11-26) 25/13 mmHg  CO: [1.5 L/min-2 L/min] 1.8 L/min  CI: [1 L/min/m2-1.4 L/min/m2] 1.3 L/min/m2   EKG with ST elevation in V2-V6  New- ? Pericarditis  Lab Results:  Basename 10/05/11 0400 10/04/11 1936 10/04/11 1930  WBC 11.8* -- 10.8*  HGB 11.5* 11.2* --  HCT 34.0* 33.0* --  PLT 186 -- 200   BMET  Basename 10/05/11 0400 10/04/11 1936 10/04/11 0528  NA 140 144 --  K 4.4 4.3 --  CL 110 109 --  CO2 22 -- 25  GLUCOSE 154* 121* --  BUN 12 7 --  CREATININE 0.76 0.70 --  CALCIUM 7.7* -- 9.5     EKG: Orders placed during the hospital encounter of 10/02/11  . EKG 12-LEAD  . EKG 12-LEAD  . EKG 12-LEAD  . EKG 12-LEAD  . EKG 12-LEAD  . EKG 12-LEAD    Studies/Results: Dg Chest Portable 1 View In Am  10/05/2011  *RADIOLOGY REPORT*  Clinical Data: Follow-up CABG  PORTABLE CHEST - 1 VIEW   Comparison: 08/18  Findings: Endotracheal tube, nasogastric tube have been removed. Mediastinal drain remains in place.  Swan-Ganz catheter has its tip in the main pulmonary artery.  Bilateral chest tubes are in place. No pneumothorax.  Mild basilar atelectasis bilaterally.  IMPRESSION: Good appearance following endotracheal tube and nasogastric tube removal.  No pneumothorax.  Mild basilar atelectasis.   Original Report Authenticated By: Thomasenia Sales, M.D. ( 10/05/2011 07:51:34 )    Dg Chest Portable 1 View  10/04/2011  *RADIOLOGY REPORT*  Clinical Data: Postop.  PORTABLE CHEST - 1 VIEW  Comparison: 09/24/2011.  Findings: Endotracheal tube terminates approximately 1.9 cm above the estimated location of the carina.  Nasogastric tube is followed into the stomach.  Right IJ Swan-Ganz catheter tip projects over the proximal right pulmonary artery.  Bilateral chest tubes, mediastinal drains and epicardial pacer wires are in place.  Heart size within normal limits given postoperative status and semi upright AP technique.  Lungs are somewhat low in volume with mild interstitial prominence.  No definite pleural fluid.  No definite pneumothorax.  IMPRESSION:  1.  Interval median sternotomy with support apparatus in place. 2.  Low lung volumes with probable vascular crowding rather than pulmonary edema.  Original Report Authenticated By: Reyes Ivan, M.D.    Medications: I have reviewed the patient's current medications.    Marland Kitchen acetaminophen (TYLENOL) oral liquid 160 mg/5 mL  650 mg Per Tube NOW   Or  . acetaminophen  650 mg Rectal NOW  . acetaminophen  1,000 mg Oral Q6H   Or  . acetaminophen (TYLENOL) oral liquid 160 mg/5 mL  975 mg Per Tube Q6H  . amLODipine  5 mg Oral Daily  . aspirin EC  325 mg Oral Daily   Or  . aspirin  324 mg Per Tube Daily  . bisacodyl  10 mg Oral Daily   Or  . bisacodyl  10 mg Rectal Daily  . cefUROXime (ZINACEF)  IV  1.5 g Intravenous To OR  . cefUROXime (ZINACEF)  IV   1.5 g Intravenous Q12H  . cycloSPORINE  1 drop Both Eyes BID  . docusate sodium  200 mg Oral Daily  . furosemide  40 mg Intravenous Once  . insulin aspart  0-24 Units Subcutaneous Q2H   Followed by  . insulin aspart  0-24 Units Subcutaneous Q4H  . insulin regular  0-10 Units Intravenous TID WC  . magnesium sulfate  4 g Intravenous Once  . metoprolol tartrate  12.5 mg Oral BID   Or  . metoprolol tartrate  12.5 mg Per Tube BID  . pantoprazole  40 mg Oral Q1200  . pantoprazole  40 mg Oral Q1200  . potassium chloride  10 mEq Intravenous Q1 Hr x 3  . potassium chloride  20 mEq Oral Once  . sodium chloride  3 mL Intravenous Q12H  . vancomycin  1,000 mg Intravenous Once  . DISCONTD: cefUROXime (ZINACEF)  IV  750 mg Intravenous To OR  . DISCONTD: cycloSPORINE  1 drop Both Eyes BID  . DISCONTD: DOPamine  2-20 mcg/kg/min Intravenous To OR  . DISCONTD: epinephrine  0.5-20 mcg/min Intravenous To OR  . DISCONTD: famotidine (PEPCID) IV  20 mg Intravenous Q12H  . DISCONTD: magnesium sulfate  40 mEq Other To OR  . DISCONTD: phenylephrine (NEO-SYNEPHRINE) Adult infusion  30-200 mcg/min Intravenous To OR  . DISCONTD: potassium chloride  80 mEq Other To OR  . DISCONTD: tranexamic acid  2 mg/kg Intracatheter To OR   Assessment/Plan: Active Problems:  Chest pain at rest associated with dyspnea  Abnormal nuclear stress test  CAD (coronary artery disease)significant diseas with 99% LAD, 80% 1st diag, 80% LCX  S/P CABG x 4, LIMA-LAD, SVG-OM2, SVG-diag, SVG-RCA secondary to symptomatic CAD and positive stress test 10/04/11   PLAN: POST-OP DAY 1 PROCEDURE: Procedure(s):  CORONARY ARTERY BYPASS GRAFTING (CABG)x4 LIMA-LAD; SVG-OM2; SVG-DIAG; SVG-RCA  EVH RIGHT LEG   EKG with ST elevations.  See Dr. Sharee Pimple note, ? pericarditis changes with rub on exam.  BP labile  86/63 during the night, now 105/64    Mild post op anemia.    LOS: 3 days   Devera Englander R 10/05/2011, 10:53 AM

## 2011-10-05 NOTE — Progress Notes (Signed)
Pt. Seen and examined. Agree with the NP/PA-C note as written.  No complaint of cardiac sounding chest pain. She is currently having her chest tubes taken out. EKG shows mild diffuse ST elevation, which is more consistent with pericarditis. I would just observe this for now, it will likely resolve. Blood pressure is improved.  Chrystie Nose, MD, [facility] Attending Cardiologist [facility]

## 2011-10-05 NOTE — Progress Notes (Addendum)
1 Day Post-Op Procedure(s) (LRB): CORONARY ARTERY BYPASS GRAFTING (CABG) (N/A)  Subjective: Patient awake and alert. Has some incisional pain and is tired.  Objective: Vital signs in last 24 hours: Patient Vitals for the past 24 hrs:  BP Temp Temp src Pulse Resp SpO2 Weight  10/05/11 0700 110/71 mmHg 98.1 F (36.7 C) - 87  28  98 % -  10/05/11 0645 - 98.4 F (36.9 C) - 87  15  97 % -  10/05/11 0600 95/75 mmHg 98.4 F (36.9 C) Core 86  23  100 % -  10/05/11 0530 - 98.2 F (36.8 C) - 86  21  96 % -  10/05/11 0500 100/67 mmHg 98.4 F (36.9 C) - 85  31  100 % 128 lb 4.9 oz (58.2 kg)  10/05/11 0400 99/64 mmHg 98.4 F (36.9 C) Core 84  30  99 % -  10/05/11 0300 101/66 mmHg 98.2 F (36.8 C) - 82  23  98 % -  10/05/11 0221 - 97.3 F (36.3 C) - 80  26  97 % -  10/05/11 0215 - 98.1 F (36.7 C) - 80  26  98 % -  10/05/11 0200 88/63 mmHg 98.1 F (36.7 C) - 80  29  100 % -  10/05/11 0145 - 98.1 F (36.7 C) - 81  28  98 % -  10/05/11 0130 89/59 mmHg 98.2 F (36.8 C) - 82  24  99 % -  10/05/11 0115 - 98.8 F (37.1 C) - 84  25  100 % -  10/05/11 0100 86/61 mmHg 98.8 F (37.1 C) - 83  24  99 % -  10/05/11 0045 - 98.6 F (37 C) - 82  21  99 % -  10/05/11 0030 97/65 mmHg 98.8 F (37.1 C) - 85  27  99 % -  10/05/11 0000 91/62 mmHg 99 F (37.2 C) Core 85  20  97 % -  10/04/11 2300 88/60 mmHg 99.3 F (37.4 C) - 85  15  100 % -  10/04/11 2230 81/61 mmHg 99.5 F (37.5 C) - 86  24  99 % -  10/04/11 2215 - 99.3 F (37.4 C) - 85  21  100 % -  10/04/11 2200 88/61 mmHg 99.5 F (37.5 C) Core 86  24  99 % -  10/04/11 2145 - 99.5 F (37.5 C) - 86  25  100 % -  10/04/11 2130 84/59 mmHg 99.7 F (37.6 C) - 87  25  100 % -  10/04/11 2115 - 99.7 F (37.6 C) - 87  26  100 % -  10/04/11 2100 86/60 mmHg 99.7 F (37.6 C) - 87  23  100 % -  10/04/11 2045 - 99.7 F (37.6 C) - 87  22  100 % -  10/04/11 2030 86/57 mmHg 99.5 F (37.5 C) - 86  14  100 % -  10/04/11 2015 - 99.5 F (37.5 C) - 86  15   100 % -  10/04/11 2000 82/51 mmHg 99.5 F (37.5 C) Core 88  24  100 % -  10/04/11 1900 90/63 mmHg 100.2 F (37.9 C) Core 91  24  100 % -  10/04/11 1845 - 100.2 F (37.9 C) Core 91  22  100 % -  10/04/11 1830 - 100 F (37.8 C) Core 90  23  100 % -  10/04/11 1823 105/53 mmHg - - - - - -  10/04/11 1815 - 100  F (37.8 C) Core 93  21  95 % -  10/04/11 1800 103/69 mmHg 100 F (37.8 C) Core - 27  - -  10/04/11 1745 - 99.7 F (37.6 C) Core 93  18  100 % -  10/04/11 1740 114/72 mmHg - - 92  - 100 % -  10/04/11 1730 - 99.3 F (37.4 C) Core 89  19  100 % -  10/04/11 1720 116/69 mmHg - - 90  20  100 % -  10/04/11 1715 - 99.1 F (37.3 C) Core 88  12  100 % -  10/04/11 1700 92/69 mmHg 99 F (37.2 C) Core 88  12  100 % -  10/04/11 1645 - 98.8 F (37.1 C) Core 88  12  100 % -  10/04/11 1630 - 98.8 F (37.1 C) Core 88  12  100 % -  10/04/11 1615 - 99 F (37.2 C) Core 88  12  100 % -  10/04/11 1600 94/72 mmHg 99 F (37.2 C) Core 88  12  100 % -  10/04/11 1545 - 99 F (37.2 C) Core 88  15  100 % -  10/04/11 1530 - 98.6 F (37 C) Core 88  12  100 % -  10/04/11 1515 - 98.1 F (36.7 C) Core 90  12  100 % -  10/04/11 1500 87/62 mmHg 97.5 F (36.4 C) Core 89  12  100 % -  10/04/11 1445 - 96.8 F (36 C) Core 85  7  100 % -  10/04/11 1430 - 96.3 F (35.7 C) Core 83  12  100 % -  10/04/11 1415 - 95.5 F (35.3 C) Core 80  12  100 % -  10/04/11 1400 85/64 mmHg 95 F (35 C) Core 80  12  100 % -  10/04/11 1345 - 94.6 F (34.8 C) Core 80  12  100 % -  10/04/11 1330 96/72 mmHg 94.6 F (34.8 C) Core 80  12  100 % -  10/04/11 1316 73/41 mmHg - - - - - -   Pre op weight  50.7kg Current Weight  10/05/11 128 lb 4.9 oz (58.2 kg)    Hemodynamic parameters for last 24 hours: PAP: (21-36)/(11-26) 25/13 mmHg CO:  [1.5 L/min-2 L/min] 1.8 L/min CI:  [1 L/min/m2-1.4 L/min/m2] 1.3 L/min/m2  Intake/Output from previous day: 08/18 0701 - 08/19 0700 In: 6109.7 [P.O.:150; I.V.:4297.7; Blood:612; IV  Piggyback:1050] Out: 3795 [Urine:2555; Blood:600; Chest Tube:640]   Physical Exam:  Cardiovascular: RRR;rub with chest tubes in place; no murmurs, gallops. Pulmonary: Diminished at bases; no rales, wheezes, or rhonchi. Abdomen: Soft, non tender, bowel sounds present. Extremities: Mild bilateral lower extremity edema. Wounds: Dressings are clean and dry.  Some bruising right lower extremity.  Lab Results: CBC: Basename 10/05/11 0400 10/04/11 1936 10/04/11 1930  WBC 11.8* -- 10.8*  HGB 11.5* 11.2* --  HCT 34.0* 33.0* --  PLT 186 -- 200   BMET:  Basename 10/05/11 0400 10/04/11 1936 10/04/11 0528  NA 140 144 --  K 4.4 4.3 --  CL 110 109 --  CO2 22 -- 25  GLUCOSE 154* 121* --  BUN 12 7 --  CREATININE 0.76 0.70 --  CALCIUM 7.7* -- 9.5    PT/INR:  Lab Results  Component Value Date   INR 1.61* 10/04/2011   INR 0.96 10/04/2011   ABG:  INR: Will add last result for INR, ABG once components are confirmed Will add last 4 CBG results once  components are confirmed  Assessment/Plan:  1. CV - SR.Cardiac index has been 1.2-1.3.Continue Lopressor 12.5 bid. 2.  Pulmonary - Encourage incentive spirometer.Chest tubes with 640 cc of output since surgery.CXR this am shows patient is rotated to the right, no pneumothorax, small bilateral pleural effusions, and low lung volumes. 3. Volume Overload - Begin diuresis. 4.  Acute blood loss anemia - H and H this am 11.5 and 34. 5. Will check HGA1C. 6. Please see progression orders  ZIMMERMAN,DONIELLE MPA-C 10/05/2011  Chart reviewed, patient examined, agree with above.   C XR:  Clear  ECG: Sinus with diffuse anterolateral ST elevation of unclear significance. She did have a deep intramyocardial LAD so I had to go through some myocardium to expose that.  Cardiac enzyme levels will be elevated due to this.

## 2011-10-05 NOTE — Progress Notes (Signed)
1 Day Post-Op Procedure(s) (LRB): CORONARY ARTERY BYPASS GRAFTING (CABG) (N/A) Subjective: S/p CABG yesterday OOB to chair, NSR, BP stable off drips Low u/o 20 cc/hr despite 40 lasix IV this am -  Pm creat 1.1 Objective: Vital signs in last 24 hours: Temp:  [97.3 F (36.3 C)-100.2 F (37.9 C)] 97.8 F (36.6 C) (08/19 1547) Pulse Rate:  [78-91] 78  (08/19 1800) Cardiac Rhythm:  [-] Normal sinus rhythm (08/19 0800) Resp:  [10-31] 15  (08/19 1800) BP: (81-131)/(51-93) 131/93 mmHg (08/19 1800) SpO2:  [94 %-100 %] 96 % (08/19 1800) Arterial Line BP: (89-147)/(43-77) 137/68 mmHg (08/19 1200) Weight:  [128 lb 4.9 oz (58.2 kg)] 128 lb 4.9 oz (58.2 kg) (08/19 0500)  Hemodynamic parameters for last 24 hours: PAP: (21-36)/(11-26) 25/19 mmHg CO:  [1.5 L/min-2.1 L/min] 2.1 L/min CI:  [1 L/min/m2-1.5 L/min/m2] 1.5 L/min/m2  Intake/Output from previous day: 08/18 0701 - 08/19 0700 In: 6109.7 [P.O.:150; I.V.:4297.7; Blood:612; IV Piggyback:1050] Out: 3795 [Urine:2555; Blood:600; Chest Tube:640] Intake/Output this shift: Total I/O In: 944.3 [P.O.:600; I.V.:292.3; IV Piggyback:52] Out: 345 [Urine:195; Chest Tube:150]  Resting comfortably  Lab Results:  Basename 10/05/11 1745 10/05/11 1742 10/05/11 0400  WBC -- 12.6* 11.8*  HGB 12.2 11.8* --  HCT 36.0 35.5* --  PLT -- 195 186   BMET:  Basename 10/05/11 1745 10/05/11 0400 10/04/11 0528  NA 139 140 --  K 4.8 4.4 --  CL 107 110 --  CO2 -- 22 25  GLUCOSE 134* 154* --  BUN 20 12 --  CREATININE 1.10 0.76 --  CALCIUM -- 7.7* 9.5    PT/INR:  Basename 10/04/11 1315  LABPROT 19.4*  INR 1.61*   ABG    Component Value Date/Time   PHART 7.296* 10/04/2011 1932   HCO3 21.9 10/04/2011 1932   TCO2 23 10/05/2011 1745   ACIDBASEDEF 4.0* 10/04/2011 1932   O2SAT 98.0 10/04/2011 1932   CBG (last 3)   Basename 10/05/11 1544 10/05/11 1217 10/05/11 0432  GLUCAP 130* 112* 121*    Assessment/Plan: S/P Procedure(s) (LRB): CORONARY ARTERY  BYPASS GRAFTING (CABG) (N/A) Cont current care   LOS: 3 days    VAN TRIGT III,PETER 10/05/2011

## 2011-10-05 NOTE — Anesthesia Postprocedure Evaluation (Signed)
  Anesthesia Post-op Note  Patient: Theresa Crosby  Procedure(s) Performed: Procedure(s) (LRB): CORONARY ARTERY BYPASS GRAFTING (CABG) (N/A)  Patient Location: SICU  Anesthesia Type: General  Level of Consciousness: awake, alert  and oriented  Airway and Oxygen Therapy: Patient Spontanous Breathing and Patient connected to nasal cannula oxygen  Post-op Pain: mild  Post-op Assessment: Post-op Vital signs reviewed and Patient's Cardiovascular Status Stable  Post-op Vital Signs: Reviewed and stable  Complications: No apparent anesthesia complications

## 2011-10-05 NOTE — Plan of Care (Signed)
Problem: Phase III Progression Outcomes Goal: Ambulates with pain/dyspnea controlled Outcome: Progressing Ambulated on O2 at 1 L Theresa Crosby. Ambulated 60 ft with 2 person assist

## 2011-10-05 NOTE — Plan of Care (Signed)
Problem: Phase II Progression Outcomes Goal: Cardiac index > or equal to 1.8 Outcome: Progressing Increased to 1.4. SGC discontinued today.

## 2011-10-06 LAB — BASIC METABOLIC PANEL
BUN: 22 mg/dL (ref 6–23)
CO2: 22 mEq/L (ref 19–32)
Calcium: 8.7 mg/dL (ref 8.4–10.5)
Chloride: 105 mEq/L (ref 96–112)
Creatinine, Ser: 1.08 mg/dL (ref 0.50–1.10)
GFR calc Af Amer: 56 mL/min — ABNORMAL LOW (ref >90)
GFR calc non Af Amer: 48 mL/min — ABNORMAL LOW (ref >90)
Glucose, Bld: 88 mg/dL (ref 70–99)
Potassium: 4.6 mEq/L (ref 3.5–5.1)
Sodium: 137 mEq/L (ref 135–145)

## 2011-10-06 LAB — GLUCOSE, CAPILLARY
Glucose-Capillary: 104 mg/dL — ABNORMAL HIGH (ref 70–99)
Glucose-Capillary: 66 mg/dL — ABNORMAL LOW (ref 70–99)
Glucose-Capillary: 87 mg/dL (ref 70–99)
Glucose-Capillary: 99 mg/dL (ref 70–99)

## 2011-10-06 LAB — HEMOGLOBIN A1C
Hgb A1c MFr Bld: 5.8 % — ABNORMAL HIGH (ref <5.7)
Mean Plasma Glucose: 120 mg/dL — ABNORMAL HIGH (ref <117)

## 2011-10-06 MED ORDER — FUROSEMIDE 40 MG PO TABS
40.0000 mg | ORAL_TABLET | Freq: Every day | ORAL | Status: DC
  Administered 2011-10-06: 40 mg via ORAL
  Filled 2011-10-06: qty 1

## 2011-10-06 MED ORDER — GUAIFENESIN ER 600 MG PO TB12
600.0000 mg | ORAL_TABLET | Freq: Two times a day (BID) | ORAL | Status: DC | PRN
  Administered 2011-10-07: 600 mg via ORAL
  Filled 2011-10-06 (×2): qty 1

## 2011-10-06 MED ORDER — SODIUM CHLORIDE 0.9 % IJ SOLN: 3.0000 mL | INTRAMUSCULAR | Status: DC | PRN

## 2011-10-06 MED ORDER — SIMVASTATIN 20 MG PO TABS
20.0000 mg | ORAL_TABLET | Freq: Every day | ORAL | Status: DC
  Administered 2011-10-07: 20 mg via ORAL
  Filled 2011-10-06 (×3): qty 1

## 2011-10-06 MED ORDER — SODIUM CHLORIDE 0.9 % IJ SOLN
3.0000 mL | Freq: Two times a day (BID) | INTRAMUSCULAR | Status: DC
  Administered 2011-10-06 – 2011-10-08 (×4): 3 mL via INTRAVENOUS

## 2011-10-06 MED ORDER — OXYCODONE HCL 5 MG PO TABS: 5.0000 mg | ORAL_TABLET | ORAL | Status: DC | PRN

## 2011-10-06 MED ORDER — LATANOPROST 0.005 % OP SOLN
1.0000 [drp] | Freq: Every day | OPHTHALMIC | Status: DC
  Administered 2011-10-07: 1 [drp] via OPHTHALMIC
  Filled 2011-10-06: qty 2.5

## 2011-10-06 MED ORDER — SODIUM CHLORIDE 0.9 % IV SOLN: 250.0000 mL | INTRAVENOUS | Status: DC | PRN

## 2011-10-06 MED ORDER — TRAMADOL HCL 50 MG PO TABS
50.0000 mg | ORAL_TABLET | Freq: Four times a day (QID) | ORAL | Status: DC
  Administered 2011-10-06 – 2011-10-07 (×2): 50 mg via ORAL
  Filled 2011-10-06 (×2): qty 1

## 2011-10-06 MED ORDER — METOPROLOL TARTRATE 12.5 MG HALF TABLET
12.5000 mg | ORAL_TABLET | Freq: Two times a day (BID) | ORAL | Status: DC
  Administered 2011-10-06: 12.5 mg via ORAL
  Filled 2011-10-06 (×2): qty 1

## 2011-10-06 MED ORDER — INSULIN ASPART 100 UNIT/ML ~~LOC~~ SOLN: 0.0000 [IU] | Freq: Three times a day (TID) | SUBCUTANEOUS | Status: DC

## 2011-10-06 MED ORDER — MOVING RIGHT ALONG BOOK
Freq: Once | Status: AC
  Administered 2011-10-06: 13:00:00
  Filled 2011-10-06: qty 1

## 2011-10-06 NOTE — Progress Notes (Signed)
Patient had 4 beat run of VT. Patient asymptomatic. Denies pain or shortness of breath. Resting in bed. Hr now 76 NSR BP 107/73. [name], PA notified; no new orders. Will continue to monitor. Call bell and family near. Theresa Crosby

## 2011-10-06 NOTE — Progress Notes (Addendum)
2 Days Post-Op Procedure(s) (LRB): CORONARY ARTERY BYPASS GRAFTING (CABG) (N/A)  Subjective: Patient brushing her teeth this am and has no complaints.  Objective: Vital signs in last 24 hours: Patient Vitals for the past 24 hrs:  BP Temp Temp src Pulse Resp SpO2 Weight  10/06/11 0700 129/84 mmHg - - 77  17  99 % -  10/06/11 0600 119/72 mmHg - - 76  13  100 % -  10/06/11 0500 97/66 mmHg - - 75  12  100 % 127 lb 13.9 oz (58 kg)  10/06/11 0400 96/61 mmHg - - - 10  - -  10/06/11 0311 107/68 mmHg - - 74  12  100 % -  10/06/11 0300 88/53 mmHg - - 75  11  100 % -  10/06/11 0200 100/73 mmHg - - 74  11  99 % -  10/06/11 0100 105/77 mmHg - - 78  12  100 % -  10/06/11 0000 118/69 mmHg - - 77  11  100 % -  10/05/11 2300 118/78 mmHg - - - 16  100 % -  10/05/11 2200 113/72 mmHg - - - 13  100 % -  10/05/11 2100 115/64 mmHg - - 81  17  98 % -  10/05/11 2000 115/78 mmHg - - 80  10  100 % -  10/05/11 1941 - 97.9 F (36.6 C) Oral - - - -  10/05/11 1900 114/71 mmHg - - 80  11  100 % -  10/05/11 1800 131/93 mmHg - - 78  15  96 % -  10/05/11 1700 116/76 mmHg - - 79  14  99 % -  10/05/11 1600 101/68 mmHg - - 78  10  98 % -  10/05/11 1547 - 97.8 F (36.6 C) Oral - - - -  10/05/11 1500 106/69 mmHg - - 79  13  94 % -  10/05/11 1400 103/69 mmHg - - 79  10  97 % -  10/05/11 1300 110/79 mmHg - - 83  14  100 % -  10/05/11 1230 107/74 mmHg - - - - - -  10/05/11 1200 107/74 mmHg - - 84  19  97 % -  10/05/11 1139 - 97.4 F (36.3 C) Oral - - - -  10/05/11 1100 102/73 mmHg - - 87  18  98 % -  10/05/11 1000 105/64 mmHg 98.1 F (36.7 C) - 86  24  99 % -  10/05/11 0900 97/67 mmHg 98.2 F (36.8 C) - 87  12  98 % -  10/05/11 0800 100/68 mmHg 98.1 F (36.7 C) Core 87  18  97 % -   Pre op weight  50.7kg Current Weight  10/06/11 127 lb 13.9 oz (58 kg)    Hemodynamic parameters for last 24 hours: PAP: (25-29)/(19) 25/19 mmHg  Intake/Output from previous day: 08/19 0701 - 08/20 0700 In: 1434.3 [P.O.:780;  I.V.:552.3; IV Piggyback:102] Out: 750 [Urine:600; Chest Tube:150]   Physical Exam:  Cardiovascular: RRR; no murmurs, gallops, or rubs. Pulmonary: Diminished at bases; no rales, wheezes, or rhonchi. Abdomen: Soft, non tender, bowel sounds present. Extremities: Mild bilateral lower extremity edema. Wounds: Dressings are clean and dry.  Some bruising right lower extremity.  Lab Results: CBC:  Basename 10/05/11 1745 10/05/11 1742 10/05/11 0400  WBC -- 12.6* 11.8*  HGB 12.2 11.8* --  HCT 36.0 35.5* --  PLT -- 195 186   BMET:   Basename 10/05/11 1745 10/05/11 1742 10/05/11 0400 10/04/11  0528  NA 139 -- 140 --  K 4.8 -- 4.4 --  CL 107 -- 110 --  CO2 -- -- 22 25  GLUCOSE 134* -- 154* --  BUN 20 -- 12 --  CREATININE 1.10 1.01 -- --  CALCIUM -- -- 7.7* 9.5    PT/INR:  Lab Results  Component Value Date   INR 1.61* 10/04/2011   INR 0.96 10/04/2011   ABG:  INR: Will add last result for INR, ABG once components are confirmed Will add last 4 CBG results once components are confirmed  Assessment/Plan:  1. CV - SR.Cardiac index has been 1.2-1.3.Continue Lopressor 12.5 bid, Norvasc 5 daily. 2.  Pulmonary - Encourage incentive spirometer . 3. Volume Overload - Begin diuresis. 4.  Acute blood loss anemia - H and H stable at 12.2 and 36. 5.Await HGA1C results. 6.Await BMET. 7.Transfer to [facility]U.  ZIMMERMAN,DONIELLE MPA-C 10/06/2011    Chart reviewed, patient examined, agree with above.

## 2011-10-06 NOTE — Progress Notes (Signed)
Subjective:  Mild chest wall pain  Objective:  Temp:  [97.3 F (36.3 C)-98.1 F (36.7 C)] 97.3 F (36.3 C) (08/20 0755) Pulse Rate:  [74-87] 78  (08/20 0800) Resp:  [10-24] 20  (08/20 0800) BP: (88-131)/(53-93) 130/71 mmHg (08/20 0800) SpO2:  [94 %-100 %] 99 % (08/20 0800) Arterial Line BP: (137-139)/(63-70) 137/68 mmHg (08/19 1200) Weight:  [58 kg (127 lb 13.9 oz)] 58 kg (127 lb 13.9 oz) (08/20 0500) Weight change: -0.2 kg (-7.1 oz)  Intake/Output from previous day: 08/19 0701 - 08/20 0700 In: 1434.3 [P.O.:780; I.V.:552.3; IV Piggyback:102] Out: 750 [Urine:600; Chest Tube:150]  Intake/Output from this shift: Total I/O In: 20 [I.V.:20] Out: 15 [Urine:15]  Physical Exam: General appearance: alert, cooperative, appears stated age and mild distress Neck: no adenopathy, no carotid bruit, no JVD, supple, symmetrical, trachea midline and thyroid not enlarged, symmetric, no tenderness/mass/nodules Lungs: clear to auscultation bilaterally Heart: normal apical impulse Extremities: extremities normal, atraumatic, no cyanosis or edema  Lab Results: Results for orders placed during the hospital encounter of 10/02/11 (from the past 48 hour(s))  POCT I-STAT 4, (NA,K, GLUC, HGB,HCT)     Status: Abnormal   Collection Time   10/04/11 10:01 AM      Component Value Range Comment   Sodium 137  135 - 145 mEq/L    Potassium 3.4 (*) 3.5 - 5.1 mEq/L    Glucose, Bld 117 (*) 70 - 99 mg/dL    HCT 16.1 (*) 09.6 - 46.0 %    Hemoglobin 11.6 (*) 12.0 - 15.0 g/dL   POCT I-STAT 4, (NA,K, GLUC, HGB,HCT)     Status: Abnormal   Collection Time   10/04/11 10:11 AM      Component Value Range Comment   Sodium 134 (*) 135 - 145 mEq/L    Potassium 3.8  3.5 - 5.1 mEq/L    Glucose, Bld 88  70 - 99 mg/dL    HCT 04.5 (*) 40.9 - 46.0 %    Hemoglobin 6.8 (*) 12.0 - 15.0 g/dL   POCT I-STAT 3, BLOOD GAS (G3+)     Status: Abnormal   Collection Time   10/04/11 10:15 AM      Component Value Range Comment   pH,  Arterial 7.433  7.350 - 7.450    pCO2 arterial 35.1  35.0 - 45.0 mmHg    pO2, Arterial 345.0 (*) 80.0 - 100.0 mmHg    Bicarbonate 23.4  20.0 - 24.0 mEq/L    TCO2 25  0 - 100 mmol/L    O2 Saturation 100.0      Acid-base deficit 1.0  0.0 - 2.0 mmol/L    Sample type ARTERIAL     HEMOGLOBIN AND HEMATOCRIT, BLOOD     Status: Abnormal   Collection Time   10/04/11 11:08 AM      Component Value Range Comment   Hemoglobin 9.3 (*) 12.0 - 15.0 g/dL    HCT 81.1 (*) 91.4 - 46.0 %   PLATELET COUNT     Status: Abnormal   Collection Time   10/04/11 11:08 AM      Component Value Range Comment   Platelets 92 (*) 150 - 400 K/uL DELTA CHECK NOTED  POCT I-STAT 4, (NA,K, GLUC, HGB,HCT)     Status: Abnormal   Collection Time   10/04/11 11:11 AM      Component Value Range Comment   Sodium 138  135 - 145 mEq/L    Potassium 4.2  3.5 - 5.1 mEq/L  Glucose, Bld 126 (*) 70 - 99 mg/dL    HCT 45.4 (*) 09.8 - 46.0 %    Hemoglobin 9.2 (*) 12.0 - 15.0 g/dL   PREPARE PLATELET PHERESIS     Status: Normal   Collection Time   10/04/11 11:20 AM      Component Value Range Comment   Unit Number [id number]      Blood Component Type PLTPHER LR1      Unit division 00      Status of Unit ISSUED,FINAL      Transfusion Status OK TO TRANSFUSE     POCT I-STAT 4, (NA,K, GLUC, HGB,HCT)     Status: Abnormal   Collection Time   10/04/11 12:07 PM      Component Value Range Comment   Sodium 139  135 - 145 mEq/L    Potassium 3.8  3.5 - 5.1 mEq/L    Glucose, Bld 115 (*) 70 - 99 mg/dL    HCT 30.8 (*) 65.7 - 46.0 %    Hemoglobin 8.8 (*) 12.0 - 15.0 g/dL   POCT I-STAT 3, BLOOD GAS (G3+)     Status: Abnormal   Collection Time   10/04/11 12:10 PM      Component Value Range Comment   pH, Arterial 7.436  7.350 - 7.450    pCO2 arterial 33.6 (*) 35.0 - 45.0 mmHg    pO2, Arterial 314.0 (*) 80.0 - 100.0 mmHg    Bicarbonate 22.6  20.0 - 24.0 mEq/L    TCO2 24  0 - 100 mmol/L    O2 Saturation 100.0      Acid-base deficit 1.0  0.0 -  2.0 mmol/L    Sample type ARTERIAL     CBC     Status: Abnormal   Collection Time   10/04/11  1:15 PM      Component Value Range Comment   WBC 11.5 (*) 4.0 - 10.5 K/uL    RBC 3.64 (*) 3.87 - 5.11 MIL/uL    Hemoglobin 10.9 (*) 12.0 - 15.0 g/dL POST TRANSFUSION SPECIMEN   HCT 31.6 (*) 36.0 - 46.0 %    MCV 86.8  78.0 - 100.0 fL    MCH 29.9  26.0 - 34.0 pg    MCHC 34.5  30.0 - 36.0 g/dL    RDW 84.6  96.2 - 95.2 %    Platelets 184  150 - 400 K/uL POST TRANSFUSION SPECIMEN  PROTIME-INR     Status: Abnormal   Collection Time   10/04/11  1:15 PM      Component Value Range Comment   Prothrombin Time 19.4 (*) 11.6 - 15.2 seconds    INR 1.61 (*) 0.00 - 1.49   APTT     Status: Abnormal   Collection Time   10/04/11  1:15 PM      Component Value Range Comment   aPTT 38 (*) 24 - 37 seconds   POCT I-STAT 4, (NA,K, GLUC, HGB,HCT)     Status: Abnormal   Collection Time   10/04/11  1:18 PM      Component Value Range Comment   Sodium 140  135 - 145 mEq/L    Potassium 3.5  3.5 - 5.1 mEq/L    Glucose, Bld 100 (*) 70 - 99 mg/dL    HCT 84.1 (*) 32.4 - 46.0 %    Hemoglobin 10.9 (*) 12.0 - 15.0 g/dL   POCT I-STAT 3, BLOOD GAS (G3+)     Status: Abnormal   Collection Time  10/04/11  1:22 PM      Component Value Range Comment   pH, Arterial 7.417  7.350 - 7.450    pCO2 arterial 32.9 (*) 35.0 - 45.0 mmHg    pO2, Arterial 99.0  80.0 - 100.0 mmHg    Bicarbonate 21.6  20.0 - 24.0 mEq/L    TCO2 23  0 - 100 mmol/L    O2 Saturation 98.0      Acid-base deficit 3.0 (*) 0.0 - 2.0 mmol/L    Patient temperature 34.8 C      Sample type ARTERIAL     GLUCOSE, CAPILLARY     Status: Abnormal   Collection Time   10/04/11  2:26 PM      Component Value Range Comment   Glucose-Capillary 101 (*) 70 - 99 mg/dL   GLUCOSE, CAPILLARY     Status: Abnormal   Collection Time   10/04/11  3:26 PM      Component Value Range Comment   Glucose-Capillary 102 (*) 70 - 99 mg/dL   GLUCOSE, CAPILLARY     Status: Normal    Collection Time   10/04/11  4:06 PM      Component Value Range Comment   Glucose-Capillary 98  70 - 99 mg/dL   GLUCOSE, CAPILLARY     Status: Abnormal   Collection Time   10/04/11  6:03 PM      Component Value Range Comment   Glucose-Capillary 123 (*) 70 - 99 mg/dL   POCT I-STAT 3, BLOOD GAS (G3+)     Status: Abnormal   Collection Time   10/04/11  6:04 PM      Component Value Range Comment   pH, Arterial 7.303 (*) 7.350 - 7.450    pCO2 arterial 43.8  35.0 - 45.0 mmHg    pO2, Arterial 126.0 (*) 80.0 - 100.0 mmHg    Bicarbonate 21.5  20.0 - 24.0 mEq/L    TCO2 23  0 - 100 mmol/L    O2 Saturation 98.0      Acid-base deficit 4.0 (*) 0.0 - 2.0 mmol/L    Patient temperature 37.7 C      Collection site ARTERIAL LINE      Drawn by Nurse      Sample type ARTERIAL     CBC     Status: Abnormal   Collection Time   10/04/11  7:30 PM      Component Value Range Comment   WBC 10.8 (*) 4.0 - 10.5 K/uL    RBC 3.77 (*) 3.87 - 5.11 MIL/uL    Hemoglobin 11.5 (*) 12.0 - 15.0 g/dL    HCT 45.4 (*) 09.8 - 46.0 %    MCV 87.8  78.0 - 100.0 fL    MCH 30.5  26.0 - 34.0 pg    MCHC 34.7  30.0 - 36.0 g/dL    RDW 11.9 (*) 14.7 - 15.5 %    Platelets 200  150 - 400 K/uL   MAGNESIUM     Status: Abnormal   Collection Time   10/04/11  7:30 PM      Component Value Range Comment   Magnesium 3.6 (*) 1.5 - 2.5 mg/dL   CREATININE, SERUM     Status: Abnormal   Collection Time   10/04/11  7:30 PM      Component Value Range Comment   Creatinine, Ser 0.62  0.50 - 1.10 mg/dL    GFR calc non Af Amer 85 (*) >90 mL/min    GFR calc  Af Amer >90  >90 mL/min   POCT I-STAT 3, BLOOD GAS (G3+)     Status: Abnormal   Collection Time   10/04/11  7:32 PM      Component Value Range Comment   pH, Arterial 7.296 (*) 7.350 - 7.450    pCO2 arterial 45.4 (*) 35.0 - 45.0 mmHg    pO2, Arterial 125.0 (*) 80.0 - 100.0 mmHg    Bicarbonate 21.9  20.0 - 24.0 mEq/L    TCO2 23  0 - 100 mmol/L    O2 Saturation 98.0      Acid-base deficit 4.0  (*) 0.0 - 2.0 mmol/L    Patient temperature 37.8 C      Sample type ARTERIAL     GLUCOSE, CAPILLARY     Status: Abnormal   Collection Time   10/04/11  7:34 PM      Component Value Range Comment   Glucose-Capillary 112 (*) 70 - 99 mg/dL   POCT I-STAT, CHEM 8     Status: Abnormal   Collection Time   10/04/11  7:36 PM      Component Value Range Comment   Sodium 144  135 - 145 mEq/L    Potassium 4.3  3.5 - 5.1 mEq/L    Chloride 109  96 - 112 mEq/L    BUN 7  6 - 23 mg/dL    Creatinine, Ser 1.19  0.50 - 1.10 mg/dL    Glucose, Bld 147 (*) 70 - 99 mg/dL    Calcium, Ion 8.29 (*) 1.13 - 1.30 mmol/L    TCO2 20  0 - 100 mmol/L    Hemoglobin 11.2 (*) 12.0 - 15.0 g/dL    HCT 56.2 (*) 13.0 - 46.0 %   GLUCOSE, CAPILLARY     Status: Abnormal   Collection Time   10/05/11 12:05 AM      Component Value Range Comment   Glucose-Capillary 141 (*) 70 - 99 mg/dL   CBC     Status: Abnormal   Collection Time   10/05/11  4:00 AM      Component Value Range Comment   WBC 11.8 (*) 4.0 - 10.5 K/uL    RBC 3.83 (*) 3.87 - 5.11 MIL/uL    Hemoglobin 11.5 (*) 12.0 - 15.0 g/dL    HCT 86.5 (*) 78.4 - 46.0 %    MCV 88.8  78.0 - 100.0 fL    MCH 30.0  26.0 - 34.0 pg    MCHC 33.8  30.0 - 36.0 g/dL    RDW 69.6 (*) 29.5 - 15.5 %    Platelets 186  150 - 400 K/uL   BASIC METABOLIC PANEL     Status: Abnormal   Collection Time   10/05/11  4:00 AM      Component Value Range Comment   Sodium 140  135 - 145 mEq/L    Potassium 4.4  3.5 - 5.1 mEq/L    Chloride 110  96 - 112 mEq/L    CO2 22  19 - 32 mEq/L    Glucose, Bld 154 (*) 70 - 99 mg/dL    BUN 12  6 - 23 mg/dL    Creatinine, Ser 2.84  0.50 - 1.10 mg/dL    Calcium 7.7 (*) 8.4 - 10.5 mg/dL    GFR calc non Af Amer 79 (*) >90 mL/min    GFR calc Af Amer >90  >90 mL/min   MAGNESIUM     Status: Abnormal   Collection Time  10/05/11  4:00 AM      Component Value Range Comment   Magnesium 3.3 (*) 1.5 - 2.5 mg/dL   GLUCOSE, CAPILLARY     Status: Abnormal   Collection  Time   10/05/11  4:32 AM      Component Value Range Comment   Glucose-Capillary 121 (*) 70 - 99 mg/dL   GLUCOSE, CAPILLARY     Status: Abnormal   Collection Time   10/05/11 12:17 PM      Component Value Range Comment   Glucose-Capillary 112 (*) 70 - 99 mg/dL   GLUCOSE, CAPILLARY     Status: Abnormal   Collection Time   10/05/11  3:44 PM      Component Value Range Comment   Glucose-Capillary 130 (*) 70 - 99 mg/dL    Comment 1 Documented in Chart      Comment 2 Notify RN     MAGNESIUM     Status: Abnormal   Collection Time   10/05/11  5:42 PM      Component Value Range Comment   Magnesium 3.3 (*) 1.5 - 2.5 mg/dL   CBC     Status: Abnormal   Collection Time   10/05/11  5:42 PM      Component Value Range Comment   WBC 12.6 (*) 4.0 - 10.5 K/uL    RBC 3.87  3.87 - 5.11 MIL/uL    Hemoglobin 11.8 (*) 12.0 - 15.0 g/dL    HCT 14.7 (*) 82.9 - 46.0 %    MCV 91.7  78.0 - 100.0 fL    MCH 30.5  26.0 - 34.0 pg    MCHC 33.2  30.0 - 36.0 g/dL    RDW 56.2 (*) 13.0 - 15.5 %    Platelets 195  150 - 400 K/uL   CREATININE, SERUM     Status: Abnormal   Collection Time   10/05/11  5:42 PM      Component Value Range Comment   Creatinine, Ser 1.01  0.50 - 1.10 mg/dL    GFR calc non Af Amer 52 (*) >90 mL/min    GFR calc Af Amer 61 (*) >90 mL/min   POCT I-STAT, CHEM 8     Status: Abnormal   Collection Time   10/05/11  5:45 PM      Component Value Range Comment   Sodium 139  135 - 145 mEq/L    Potassium 4.8  3.5 - 5.1 mEq/L    Chloride 107  96 - 112 mEq/L    BUN 20  6 - 23 mg/dL    Creatinine, Ser 8.65  0.50 - 1.10 mg/dL    Glucose, Bld 784 (*) 70 - 99 mg/dL    Calcium, Ion 6.96  2.95 - 1.30 mmol/L    TCO2 23  0 - 100 mmol/L    Hemoglobin 12.2  12.0 - 15.0 g/dL    HCT 28.4  13.2 - 44.0 %   GLUCOSE, CAPILLARY     Status: Abnormal   Collection Time   10/05/11  7:39 PM      Component Value Range Comment   Glucose-Capillary 104 (*) 70 - 99 mg/dL    Comment 1 Documented in Chart      Comment 2  Notify RN     GLUCOSE, CAPILLARY     Status: Normal   Collection Time   10/05/11 11:56 PM      Component Value Range Comment   Glucose-Capillary 99  70 - 99 mg/dL  GLUCOSE, CAPILLARY     Status: Normal   Collection Time   10/06/11  3:15 AM      Component Value Range Comment   Glucose-Capillary 87  70 - 99 mg/dL   BASIC METABOLIC PANEL     Status: Abnormal   Collection Time   10/06/11  6:45 AM      Component Value Range Comment   Sodium 137  135 - 145 mEq/L    Potassium 4.6  3.5 - 5.1 mEq/L    Chloride 105  96 - 112 mEq/L    CO2 22  19 - 32 mEq/L    Glucose, Bld 88  70 - 99 mg/dL    BUN 22  6 - 23 mg/dL    Creatinine, Ser 1.61  0.50 - 1.10 mg/dL    Calcium 8.7  8.4 - 09.6 mg/dL    GFR calc non Af Amer 48 (*) >90 mL/min    GFR calc Af Amer 56 (*) >90 mL/min   GLUCOSE, CAPILLARY     Status: Abnormal   Collection Time   10/06/11  7:53 AM      Component Value Range Comment   Glucose-Capillary 66 (*) 70 - 99 mg/dL    Comment 1 Documented in Chart      Comment 2 Notify RN       Imaging: Imaging results have been reviewed  Assessment/Plan:   1. Principal Problem: 2.  *Chest pain at rest associated with dyspnea 3. Active Problems: 4.  Abnormal nuclear stress test 5.  CAD (coronary artery disease)significant diseas with 99% LAD, 80% 1st diag, 80% LCX 6.  S/P CABG x 4, LIMA-LAD, SVG-OM2, SVG-diag, SVG-RCA secondary to symptomatic CAD and positive stress test 10/04/11 7.   Time Spent Directly with Patient:  20 minutes  Length of Stay:  LOS: 4 days   POD # 2 CABG X 4 for critical CAD. Looks great. VSS. Labs OK. EKG shows diffuse ST elevation c/w pericarditis. Plan to transfer to tele today. Ambulate, CRH, nl progression.  Runell Gess 10/06/2011, 9:23 AM

## 2011-10-06 NOTE — Progress Notes (Signed)
Patient coughed and is incontinent of urine. Patient cleaned with gown and linens changed. Returned to chair. Call bell. Theresa Crosby

## 2011-10-06 NOTE — Progress Notes (Signed)
CARDIAC REHAB PHASE I   PRE:  Rate/Rhythm: 75SR  BP:  Supine: 120/74  Sitting:   Standing:    SaO2: 94-95%RA  MODE:  Ambulation: 100 ft   POST:  Rate/Rhythem: 78SR  BP:  Supine:   Sitting: 116/77  Standing:    SaO2: 96-98%RA 1346-1413 Pt very sleepy. Walked 100 ft on RA with rolling walker and asst x 2. Followed with chair. Pt had to sit down once to rest due to being tired. To recliner after walk. Encouraged OOB as tolerated in recliner. Husband in room. Sats and BP good. Denied dizziness. Just tired.  Duanne Limerick

## 2011-10-07 ENCOUNTER — Inpatient Hospital Stay (HOSPITAL_COMMUNITY): Payer: BC Managed Care – PPO

## 2011-10-07 DIAGNOSIS — E785 Hyperlipidemia, unspecified: Secondary | ICD-10-CM | POA: Diagnosis present

## 2011-10-07 DIAGNOSIS — I10 Essential (primary) hypertension: Secondary | ICD-10-CM | POA: Diagnosis present

## 2011-10-07 DIAGNOSIS — G629 Polyneuropathy, unspecified: Secondary | ICD-10-CM | POA: Diagnosis present

## 2011-10-07 LAB — TYPE AND SCREEN
ABO/RH(D): A POS
Antibody Screen: POSITIVE
DAT, IgG: NEGATIVE
Unit division: 0
Unit division: 0
Unit division: 0
Unit division: 0
Unit division: 0
Unit division: 0

## 2011-10-07 LAB — CBC
HCT: 34.9 % — ABNORMAL LOW (ref 36.0–46.0)
Hemoglobin: 11.8 g/dL — ABNORMAL LOW (ref 12.0–15.0)
MCH: 30.6 pg (ref 26.0–34.0)
MCHC: 33.8 g/dL (ref 30.0–36.0)
MCV: 90.6 fL (ref 78.0–100.0)
Platelets: 208 10*3/uL (ref 150–400)
RBC: 3.85 MIL/uL — ABNORMAL LOW (ref 3.87–5.11)
RDW: 17.1 % — ABNORMAL HIGH (ref 11.5–15.5)
WBC: 12.6 10*3/uL — ABNORMAL HIGH (ref 4.0–10.5)

## 2011-10-07 LAB — BASIC METABOLIC PANEL
BUN: 25 mg/dL — ABNORMAL HIGH (ref 6–23)
CO2: 24 mEq/L (ref 19–32)
Calcium: 9.2 mg/dL (ref 8.4–10.5)
Chloride: 104 mEq/L (ref 96–112)
Creatinine, Ser: 0.79 mg/dL (ref 0.50–1.10)
GFR calc Af Amer: >90 mL/min (ref >90)
GFR calc non Af Amer: 78 mL/min — ABNORMAL LOW (ref >90)
Glucose, Bld: 97 mg/dL (ref 70–99)
Potassium: 5.3 mEq/L — ABNORMAL HIGH (ref 3.5–5.1)
Sodium: 136 mEq/L (ref 135–145)

## 2011-10-07 MED ORDER — POTASSIUM CHLORIDE CRYS ER 10 MEQ PO TBCR: 10.0000 meq | EXTENDED_RELEASE_TABLET | ORAL | Status: DC

## 2011-10-07 MED ORDER — TRAMADOL HCL 50 MG PO TABS: 50.0000 mg | ORAL_TABLET | Freq: Four times a day (QID) | ORAL | Status: DC | PRN

## 2011-10-07 MED ORDER — ASPIRIN 325 MG PO TBEC: 325.0000 mg | DELAYED_RELEASE_TABLET | Freq: Every day | ORAL | Status: AC

## 2011-10-07 MED ORDER — BENZONATATE 100 MG PO CAPS
200.0000 mg | ORAL_CAPSULE | Freq: Three times a day (TID) | ORAL | Status: DC | PRN
  Administered 2011-10-07: 200 mg via ORAL
  Filled 2011-10-07: qty 2

## 2011-10-07 MED ORDER — FUROSEMIDE 20 MG PO TABS
20.0000 mg | ORAL_TABLET | Freq: Every day | ORAL | Status: DC
  Administered 2011-10-07 – 2011-10-08 (×2): 20 mg via ORAL
  Filled 2011-10-07 (×2): qty 1

## 2011-10-07 MED ORDER — FUROSEMIDE 20 MG PO TABS: 20.0000 mg | ORAL_TABLET | Freq: Every day | ORAL | Status: DC

## 2011-10-07 MED ORDER — METOPROLOL TARTRATE 25 MG PO TABS: 12.5000 mg | ORAL_TABLET | Freq: Two times a day (BID) | ORAL | Status: DC

## 2011-10-07 MED ORDER — POTASSIUM CHLORIDE CRYS ER 10 MEQ PO TBCR
10.0000 meq | EXTENDED_RELEASE_TABLET | Freq: Once | ORAL | Status: DC
  Filled 2011-10-07: qty 1

## 2011-10-07 MED ORDER — LEVALBUTEROL HCL 0.63 MG/3ML IN NEBU
0.6300 mg | INHALATION_SOLUTION | Freq: Once | RESPIRATORY_TRACT | Status: AC
  Administered 2011-10-07: 0.63 mg via RESPIRATORY_TRACT
  Filled 2011-10-07: qty 3

## 2011-10-07 MED ORDER — SIMVASTATIN 20 MG PO TABS: 20.0000 mg | ORAL_TABLET | Freq: Every day | ORAL | Status: DC

## 2011-10-07 MED ORDER — TRAMADOL HCL 50 MG PO TABS: 50.0000 mg | ORAL_TABLET | Freq: Four times a day (QID) | ORAL | Status: AC | PRN

## 2011-10-07 MED ORDER — LEVALBUTEROL HCL 0.63 MG/3ML IN NEBU
0.6300 mg | INHALATION_SOLUTION | Freq: Three times a day (TID) | RESPIRATORY_TRACT | Status: DC | PRN
  Filled 2011-10-07: qty 3

## 2011-10-07 NOTE — Progress Notes (Addendum)
Physician Discharge Summary  Patient ID: Theresa Crosby MRN: [id number] DOB/AGE: [birth year] 78 y.o.  Admit date: 10/02/2011 Discharge date: 10/08/2011  Admission Diagnoses: 1.Multivessel CAD 2.History of hypertension 3.History of dyslipidemia 4.History of GERD 5.History of arthritis 6.History of neuromuscular disorder (left leg burning sensation and numbness of toes) 7.History of anxiety  Discharge Diagnoses:  1.Multivessel CAD 2.History of hypertension 3.History of dyslipidemia 4.History of GERD 5.History of arthritis 6.History of neuromuscular disorder (left leg burning sensation and numbness of toes) 7.History of anxiety 8.DM (HGA1C 6.2  Procedure (s):  1.Cardiac catheterization done by Dr. Allyson Sabal on 10/02/2011: (1.) Left main; 20% distal taper to  (2.) LAD; 99% calcified eccentric proximal at the takeoff of the first diagonal branch. The first diagonal branch was a moderate size vessel and was bifurcating. The origin and first third of the vessel had a long 80+ percent stenosis.  (3.) Left circumflex; 80% mid OM 1.  (4.) Right coronary artery; dominant with at most 40% mid  (5.)LIMA and RIMA were subselectively visualized and widely patent. Each was suitable for use during coronary bypass grafting if required.  (6). Left ventriculography; RAO left ventriculogram was performed using  25 mL of Visipaque dye at 12 mL/second. The overall LVEF estimated  60 % Without wall motion abnormalities  (7.) Distal abdominal aortogram-the renal arteries will grow a patent. The infrarenal WR and iliac bifurcation were free of significant atherosclerotic changes.  2.Emergent median sternotomy, extracorporeal circulation,  coronary artery bypass graft surgery x4 using a left internal mammary artery graft to the left anterior descending coronary artery, with a saphenous vein graft to diagonal branch of the LAD, a saphenous vein graft to the second obtuse marginal branch of left circumflex  coronary artery, and a saphenous vein graft to the right coronary artery with endoscopic vein harvesting from the right leg by Dr. Laneta Simmers on 10/04/2011.  History of Presenting Illness: This his a 78 year old African American woman who recently presented to her primary physician with complaints of exertional substernal chest discomfort and shortness of breath, as well as exertional fatigue and tiredness. 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 She had associated dizziness and nausea and felt like she might pass out. She underwent a vasodilator stress test and results showed mild mid anterior and apical anterior reversible ischemia with normal left ventricular systolic function. She then underwent a cardiac catheterization on 10/02/2011 which showed severe three-vessel coronary disease with a 99% proximal LAD stenosis.  A cardio thoracic consultation was obtained with Dr. Laneta Simmers for the consideration of coronary artery bypass grafting surgery. Pre operative carotid duplex US showed no significant internal carotid artery stenosis bilaterally.Potential risks, benefits, and complications were discussed with the patient and she agreed to proceed. She underwent an emergent CABG on 10/04/2011.  Brief Hospital Course:  She was extubated successfully early the evening of surgery.She remained afebrile and hemodynamically stable.Her Theone Murdoch, a line, chest tubes, and foley were all removed early in her post operative course.She was started on low dose Lopressor.She was found to have mild ABL anemia. Her H and H went as low as 11.2 and 33.She was volume overloaded and diuresed accordingly. She was then started on Norvasc for better blood pressure control.She was felt surgically stable for transfer from the ICU to [facility] for further convalescence on 10/06/2011.She did develop nausea but no emesis.She has been tolerating a diet but has not had a bowel movement yet. She will  be given a laxative. Epicardial pacing wires and chest tube sutures will be removed in the morning.She is ambulating well on room air. Provided she remains afebrile, hemodynamically stable, and pending morning round evaluation, she will be surgically stable for discharge to a SNF on 10/08/2011.  Latest Vital Signs: Blood pressure 156/90, pulse 106, temperature 98.5 F (36.9 C), temperature source Oral, resp. rate 18, height 4\' 11"  (1.499 m), weight 124 lb 12.5 oz (56.6 kg), SpO2 95.00%.  Physical Exam: General appearance: alert, cooperative and no distress  Heart: regular rate and rhythm  Lungs: clear to auscultation bilaterally  Abdomen: soft, non-tender; bowel sounds normal; no masses, no organomegaly  Extremities: edema trace  Wound: clean and dry  Discharge Condition:Stable  Recent laboratory studies:  Lab Results  Component Value Date   WBC 12.6* 10/07/2011   HGB 11.8* 10/07/2011   HCT 34.9* 10/07/2011   MCV 90.6 10/07/2011   PLT 208 10/07/2011   Lab Results  Component Value Date   NA 136 10/07/2011   K 5.3* 10/07/2011   CL 104 10/07/2011   CO2 24 10/07/2011   CREATININE 0.79 10/07/2011   GLUCOSE 97 10/07/2011      Diagnostic Studies: Dg Chest 2 View  10/07/2011  *RADIOLOGY REPORT*  Clinical Data: CABG, weakness.  CHEST - 2 VIEW  Comparison: 10/05/2011  Findings: Interval removal of bilateral chest tubes and Swan-Ganz catheter.  No pneumothorax.  Increasing right basilar atelectasis and small right effusion.  Mild cardiomegaly.  IMPRESSION: No pneumothorax following bilateral chest tube removal.  Increasing right base atelectasis and right effusion.   Original Report Authenticated By: Cyndie Chime, M.D.     Discharge Orders    Future Appointments: Provider: Department: Dept Phone: Center:   10/27/2011 1:30 PM Alleen Borne, MD [facility] [phone] [id number]      Discharge Medications: Medication List  As of 10/08/2011 12:07 PM   TAKE these medications          amLODipine 5 MG tablet   Commonly known as: NORVASC   Take 5 mg by mouth daily.      aspirin 325 MG EC tablet   Take 1 tablet (325 mg total) by mouth daily.      benzonatate 200 MG capsule   Commonly known as: TESSALON   Take 1 capsule (200 mg total) by mouth 3 (three) times daily as needed for cough.      brimonidine 0.1 % Soln   Commonly known as: ALPHAGAN P   Place 1 drop into both eyes 2 (two) times daily.      Calcium-Vitamin D-Vitamin K 500-100-40 MG-UNT-MCG Chew   Chew 1 tablet by mouth 2 (two) times daily.      conjugated estrogens vaginal cream   Commonly known as: PREMARIN   Place 0.5 g vaginally 2 (two) times a week. Thursday & sunday      cycloSPORINE 0.05 % ophthalmic emulsion   Commonly known as: RESTASIS   Place 1 drop into both eyes 2 (two) times daily.      estradiol 0.0375 MG/24HR   Commonly known as: VIVELLE-DOT   Place 1 patch onto the skin 2 (two) times a week. Thursday & Sunday      FRESHKOTE 2.7-2 % Soln   Generic drug: Polyvinyl Alcohol-Povidone   Place 1 drop into both eyes 3 (three) times daily.      furosemide 20 MG tablet   Commonly known as: LASIX   Take 1 tablet (20 mg total) by mouth  daily. For 5 days then stop.      latanoprost 0.005 % ophthalmic solution   Commonly known as: XALATAN   Place 1 drop into both eyes at bedtime.      metoprolol tartrate 25 MG tablet   Commonly known as: LOPRESSOR   Take 1 tablet (25 mg total) by mouth 2 (two) times daily.      potassium chloride 10 MEQ tablet   Commonly known as: K-DUR,KLOR-CON   Take 1 tablet (10 mEq total) by mouth every other day.      PRILOSEC PO   Take 1 capsule by mouth daily.      simvastatin 20 MG tablet   Commonly known as: ZOCOR   Take 1 tablet (20 mg total) by mouth daily at 6 PM.      traMADol 50 MG tablet   Commonly known as: ULTRAM   Take 1 tablet (50 mg total) by mouth every 6 (six) hours as needed for pain.      Vitamin D3 1000 UNITS Caps   Take 2,000 Units by  mouth every morning.           She was not put on an ACE or ARB as she has a preserved LV function and BP was well controlled with Lopressor and Norvasc.  Follow Up Appointments: Follow-up Information    Follow up with Thurmon Fair, MD. (office will call you)    Contact information:   [address] [place] [address] [phone]       Follow up with Alleen Borne, MD. (PA/LAT CXR to be taken (at [facility] which is in the same building as Dr. Sharee Pimple office) on 10/27/2011 at 12:30 pm;Appointment with Dr. Laneta Simmers is on  10/27/2011 at 12:30 pm)    Contact information:   [address] [place] [address] [phone]       Follow up with Medical Doctor. (Call for a follow up appointment regarding HGA1C 5.8)          Signed: Damontae Loppnow, ERINPA-C 10/08/2011, 12:07 PM

## 2011-10-07 NOTE — Care Management Note (Unsigned)
    Page 1 of 1   10/08/2011     10:54:13 AM   CARE MANAGEMENT NOTE 10/08/2011  Patient:  Theresa Crosby, Theresa Crosby   Account Number:  [id number]  Date Initiated:  10/07/2011  Documentation initiated by:  Tayvien Kane  Subjective/Objective Assessment:   PT S/P CABG X 4 ON 10/04/11.  PTA, PT INDEPENDENT, LIVES ALONE.     Action/Plan:   MET WITH PT TO DISCUSS DC PLANS.  PT'S FI[facility], GUS, LIVES IN SAME APT. BUILDING AND WILL STAY WITH HER AT DISCHARGE. I SPOKE WITH GUS AND CONFIRMED THIS. WILL FOLLOW FOR HOME NEEDS AS PT PROGRESSES.   Anticipated DC Date:  10/08/2011   Anticipated DC Plan:  SKILLED NURSING FACILITY  In-house referral  Clinical Social Worker         Choice offered to / List presented to:             Status of service:  In process, will continue to follow Medicare Important Message given?   (If response is "NO", the following Medicare IM given date fields will be blank) Date Medicare IM given:   Date Additional Medicare IM given:    Discharge Disposition:    Per UR Regulation:    If discussed at Long Length of Stay Meetings, dates discussed:    Comments:  10/08/11 Ernestine Langworthy,RN,BSN 1100   [phone] PT STATING [facility] NOW CANNOT PROVIDE 24HR CARE AT DC, AND SHE FEELS SHE NEED ST-SNF FOR REHAB.  WILL HAVE PT EVALUATE ASAP, CSW CONSULTED TO FACILITATE DC TO SNF WHEN BED AVAILABLE.

## 2011-10-07 NOTE — Progress Notes (Addendum)
3 Days Post-Op Procedure(s) (LRB): CORONARY ARTERY BYPASS GRAFTING (CABG) (N/A)  Subjective:  Theresa Crosby complains of nausea this morning.  She is also a little bit sore.  No bowel movement   Objective: Vital signs in last 24 hours: Temp:  [97 F (36.1 C)-98.4 F (36.9 C)] 98.4 F (36.9 C) (08/21 0356) Pulse Rate:  [66-86] 85  (08/21 0356) Cardiac Rhythm:  [-] Normal sinus rhythm (08/20 1940) Resp:  [16-21] 18  (08/21 0356) BP: (108-127)/(67-83) 127/82 mmHg (08/21 0356) SpO2:  [91 %-98 %] 92 % (08/21 0356) Weight:  [128 lb 11.2 oz (58.378 kg)] 128 lb 11.2 oz (58.378 kg) (08/21 0356)  Intake/Output from previous day: 08/20 0701 - 08/21 0700 In: 90 [I.V.:40; IV Piggyback:50] Out: 55 [Urine:55]  General appearance: alert, cooperative and no distress Heart: regular rate and rhythm Lungs: clear to auscultation bilaterally Abdomen: soft, non-tender; bowel sounds normal; no masses,  no organomegaly Extremities: edema trace Wound: clean and dry  Lab Results:  Basename 10/07/11 0610 10/05/11 1745 10/05/11 1742  WBC 12.6* -- 12.6*  HGB 11.8* 12.2 --  HCT 34.9* 36.0 --  PLT 208 -- 195   BMET:  Basename 10/07/11 0610 10/06/11 0645  NA 136 137  K 5.3* 4.6  CL 104 105  CO2 24 22  GLUCOSE 97 88  BUN 25* 22  CREATININE 0.79 1.08  CALCIUM 9.2 8.7    PT/INR:  Basename 10/04/11 1315  LABPROT 19.4*  INR 1.61*   ABG    Component Value Date/Time   PHART 7.296* 10/04/2011 1932   HCO3 21.9 10/04/2011 1932   TCO2 23 10/05/2011 1745   ACIDBASEDEF 4.0* 10/04/2011 1932   O2SAT 98.0 10/04/2011 1932   CBG (last 3)   Basename 10/06/11 1131 10/06/11 0753 10/06/11 0315  GLUCAP 104* 66* 87    Assessment/Plan: S/P Procedure(s) (LRB): CORONARY ARTERY BYPASS GRAFTING (CABG) (N/A)  1. CV- NSR on lopressor and Norvasc rate and pressure controlled 2. Pulmonary- encourage IS, no acute issues 3. Volume Overload- patients weight is up 7kg since admission will continue diuresis 4.  Hyperkalemia- patient not receiving supplementation, will folllow 5. LOC constipation- continue stool softeners, can add stronger agent if necessary 6. Dispo- patient progressing well, if maintains NSR will d/c EPW in AM, patient with poor PO intake and nausea if improves can possibly d/c home Friday  LOS: 5 days    Crosby, Theresa 10/07/2011   Nausea better this afternoon. C/o cough- sometimes so hard it causes urinary incontinence- will give Tessalon PRN

## 2011-10-07 NOTE — Progress Notes (Signed)
Patient refused to ambulate in hallway. Encouraged/Informed patient of importance to ambulation 3 times/day. "I don't feel up to it now. Will re-evaluate. Theresa Crosby

## 2011-10-07 NOTE — Progress Notes (Signed)
CARDIAC REHAB PHASE I   PRE:  Rate/Rhythm: 85 SR    BP: sitting 147/82    SaO2: 95 RA  MODE:  Ambulation: 150 ft   POST:  Rate/Rhythm: 94 SR    BP: sitting 157/90     SaO2: 99 RA  More alert today. Fairly steady, had trouble holding back straight throughout walk. Very fatigued at end. Return to chair.  [id number]  Jaymi Masson CES, ACSM

## 2011-10-07 NOTE — Progress Notes (Signed)
Pt. Seen and examined. Agree with the NP/PA-C note as written.  Feels well, no chest pain. Somewhat weak. In sinus at 84. Progressing nicely.   Chrystie Nose, MD, [facility] Attending Cardiologist [facility]

## 2011-10-07 NOTE — Progress Notes (Signed)
Subjective:  Weak, couldn't ambulate yesterday.  Objective:  Vital Signs in the last 24 hours: Temp:  [97 F (36.1 C)-98.4 F (36.9 C)] 98.4 F (36.9 C) (08/21 0356) Pulse Rate:  [66-86] 85  (08/21 0356) Resp:  [16-21] 18  (08/21 0356) BP: (108-127)/(67-83) 127/82 mmHg (08/21 0356) SpO2:  [91 %-98 %] 92 % (08/21 0356) Weight:  [58.378 kg (128 lb 11.2 oz)] 58.378 kg (128 lb 11.2 oz) (08/21 0356)  Intake/Output from previous day:  Intake/Output Summary (Last 24 hours) at 10/07/11 0856 Last data filed at 10/06/11 1300  Gross per 24 hour  Intake     70 ml  Output     40 ml  Net     30 ml    Physical Exam: General appearance: alert, cooperative and no distress Lungs: decreased breath sounds Rt > Lt Heart: regular rate and rhythm   Rate: 80  Rhythm: normal sinus rhythm and 6 beats NSVT  Lab Results:  Basename 10/07/11 0610 10/05/11 1745 10/05/11 1742  WBC 12.6* -- 12.6*  HGB 11.8* 12.2 --  PLT 208 -- 195    Basename 10/07/11 0610 10/06/11 0645  NA 136 137  K 5.3* 4.6  CL 104 105  CO2 24 22  GLUCOSE 97 88  BUN 25* 22  CREATININE 0.79 1.08   No results found for this basename: TROPONINI:2,CK,MB:2 in the last 72 hours Hepatic Function Panel No results found for this basename: PROT,ALBUMIN,AST,ALT,ALKPHOS,BILITOT,BILIDIR,IBILI in the last 72 hours No results found for this basename: CHOL in the last 72 hours  Basename 10/04/11 1315  INR 1.61*    Imaging: Dg Chest 2 View  10/07/2011  *RADIOLOGY REPORT*  Clinical Data: CABG, weakness.  CHEST - 2 VIEW  Comparison: 10/05/2011  Findings: Interval removal of bilateral chest tubes and Swan-Ganz catheter.  No pneumothorax.  Increasing right basilar atelectasis and small right effusion.  Mild cardiomegaly.  IMPRESSION: No pneumothorax following bilateral chest tube removal.  Increasing right base atelectasis and right effusion.   Original Report Authenticated By: Cyndie Chime, M.D.     Cardiac  Studies:  Assessment/Plan:   Principal Problem:  *S/P CABG x 4, LIMA-LAD, SVG-OM2, SVG-diag, SVG-RCA secondary to symptomatic CAD and positive stress test 10/04/11 Active Problems:  Chest pain at rest associated with dyspnea  Abnormal nuclear stress test  CAD (coronary artery disease)significant diseas with 99% LAD, 80% 1st diag, 80% LCX  Neuropathy, lower extremities  HTN (hypertension)  Dyslipidemia, statin added   Plan- Same cardiac Rx, will follow with you.   Corine Shelter PA-C 10/07/2011, 8:56 AM

## 2011-10-07 NOTE — Progress Notes (Signed)
Attempted to ambulate patient at this time. Patient just returned to bed from bathroom. Patient c/o shortness of breath. O2sat 99% on 2L nasal cannula. Expiratory Wheezes noted. [name], PA notified; new orders received. Will administer xopenex treatment. Mamie Levers

## 2011-10-08 MED ORDER — METOPROLOL TARTRATE 25 MG PO TABS: 25.0000 mg | ORAL_TABLET | Freq: Two times a day (BID) | ORAL | Status: DC

## 2011-10-08 MED ORDER — METOPROLOL TARTRATE 25 MG PO TABS
25.0000 mg | ORAL_TABLET | Freq: Two times a day (BID) | ORAL | Status: DC
  Administered 2011-10-08: 25 mg via ORAL
  Filled 2011-10-08 (×2): qty 1

## 2011-10-08 MED ORDER — BENZONATATE 200 MG PO CAPS: 200.0000 mg | ORAL_CAPSULE | Freq: Three times a day (TID) | ORAL | Status: AC | PRN

## 2011-10-08 NOTE — Progress Notes (Signed)
Subjective:  Still weak but up eating breakfast.  Objective:  Vital Signs in the last 24 hours: Temp:  [97.6 F (36.4 C)-98.9 F (37.2 C)] 98.5 F (36.9 C) (08/22 0525) Pulse Rate:  [85-104] 104  (08/22 0525) Resp:  [18] 18  (08/22 0525) BP: (139-164)/(64-102) 142/64 mmHg (08/22 0719) SpO2:  [95 %-100 %] 95 % (08/22 0525) Weight:  [56.6 kg (124 lb 12.5 oz)] 56.6 kg (124 lb 12.5 oz) (08/22 0525)  Intake/Output from previous day:  Intake/Output Summary (Last 24 hours) at 10/08/11 0802 Last data filed at 10/08/11 1610  Gross per 24 hour  Intake    240 ml  Output   4825 ml  Net  -4585 ml    Physical Exam: General appearance: alert, cooperative and no distress Lungs: clear to auscultation bilaterally Heart: regular rate and rhythm   Rate: 100  Rhythm: normal sinus rhythm and sinus tachycardia  Lab Results:  Basename 10/07/11 0610 10/05/11 1745 10/05/11 1742  WBC 12.6* -- 12.6*  HGB 11.8* 12.2 --  PLT 208 -- 195    Basename 10/07/11 0610 10/06/11 0645  NA 136 137  K 5.3* 4.6  CL 104 105  CO2 24 22  GLUCOSE 97 88  BUN 25* 22  CREATININE 0.79 1.08   No results found for this basename: TROPONINI:2,CK,MB:2 in the last 72 hours Hepatic Function Panel No results found for this basename: PROT,ALBUMIN,AST,ALT,ALKPHOS,BILITOT,BILIDIR,IBILI in the last 72 hours No results found for this basename: CHOL in the last 72 hours No results found for this basename: INR in the last 72 hours  Imaging: Dg Chest 2 View  10/07/2011  *RADIOLOGY REPORT*  Clinical Data: CABG, weakness.  CHEST - 2 VIEW  Comparison: 10/05/2011  Findings: Interval removal of bilateral chest tubes and Swan-Ganz catheter.  No pneumothorax.  Increasing right basilar atelectasis and small right effusion.  Mild cardiomegaly.  IMPRESSION: No pneumothorax following bilateral chest tube removal.  Increasing right base atelectasis and right effusion.   Original Report Authenticated By: Cyndie Chime, M.D.      Cardiac Studies:  Assessment/Plan:   Principal Problem:  *S/P CABG x 4, LIMA-LAD, SVG-OM2, SVG-diag, SVG-RCA secondary to symptomatic CAD and positive stress test 10/04/11 Active Problems:  Chest pain at rest associated with dyspnea  Abnormal nuclear stress test  CAD with 99% LAD, 80% 1st diag, 80% LCX, NL LVF  Neuropathy, lower extremities  HTN (hypertension)  Dyslipidemia, statin added  Plan- Stop K+ supplement (K+ 5.3 yesterday). We will arrange follow up with Dr Royann Shivers as an OP    Corine Shelter PA-C 10/08/2011, 8:02 AM    Agree with note written by Corine Shelter [facility]  POD # 4 CABG. Looks great!!! VSS , NSR. Ambulating. Exam benign. Mild BLE edema. D/C per TCTS. ROV with Dr. Zachary George 10/08/2011 8:36 AM

## 2011-10-08 NOTE — Progress Notes (Signed)
DCd EWP per protocol and as ordered. Pt tolerated well, pt reminded to lie supine approximately one hour. bp 155/90 pt ST 105 on monitor will monitor patient. Eligha Kmetz, Randall An RN

## 2011-10-08 NOTE — Evaluation (Signed)
Physical Therapy Evaluation Patient Details Name: Theresa Crosby MRN: [id number] DOB: 1933-01-25 Today's Date: 10/08/2011 Time: 2130-8657 PT Time Calculation (min): 23 min  PT Assessment / Plan / Recommendation Clinical Impression  pt presents with CABG.  pt notes feeling very fatigued, but agreeable to try to amb.  pt ed on sternal precautions and safety.  pt notes plan is to D/C to ST-SNF to maximize Independence prior to D/C to home.      PT Assessment  Patient needs continued PT services    Follow Up Recommendations  Skilled nursing facility    Barriers to Discharge None      Equipment Recommendations  Rolling walker with 5" wheels    Recommendations for Other Services     Frequency Min 3X/week    Precautions / Restrictions Precautions Precautions: Sternal Restrictions Weight Bearing Restrictions: No   Pertinent Vitals/Pain Incision pain with coughing.        Mobility  Bed Mobility Bed Mobility: Not assessed Transfers Transfers: Sit to Stand;Stand to Sit Sit to Stand: 4: Min assist;With upper extremity assist;From chair/3-in-1 Stand to Sit: 4: Min assist;With upper extremity assist;To chair/3-in-1 Details for Transfer Assistance: cues fpr UEs to knees and to control descent.   Ambulation/Gait Ambulation/Gait Assistance: 4: Min guard Ambulation Distance (Feet): 40 Feet Assistive device: Rolling walker Ambulation/Gait Assistance Details: cues for encouragement, upright posture, pursed lip breathing.   Gait Pattern: Step-through pattern;Decreased stride length Stairs: No Wheelchair Mobility Wheelchair Mobility: No    Exercises     PT Diagnosis: Difficulty walking  PT Problem List: Decreased activity tolerance;Decreased balance;Decreased mobility;Decreased knowledge of use of DME;Decreased knowledge of precautions PT Treatment Interventions: DME instruction;Gait training;Functional mobility training;Therapeutic activities;Therapeutic exercise;Balance  training;Patient/family education   PT Goals Acute Rehab PT Goals PT Goal Formulation: With patient Time For Goal Achievement: 10/22/11 Potential to Achieve Goals: Good Pt will Roll Supine to Right Side: with modified independence PT Goal: Rolling Supine to Right Side - Progress: Goal set today Pt will Roll Supine to Left Side: with modified independence PT Goal: Rolling Supine to Left Side - Progress: Goal set today Pt will go Supine/Side to Sit: with modified independence PT Goal: Supine/Side to Sit - Progress: Goal set today Pt will go Sit to Supine/Side: with modified independence PT Goal: Sit to Supine/Side - Progress: Goal set today Pt will go Sit to Stand: with modified independence PT Goal: Sit to Stand - Progress: Goal set today Pt will go Stand to Sit: with modified independence PT Goal: Stand to Sit - Progress: Goal set today Pt will Ambulate: >150 feet;with modified independence;with rolling walker PT Goal: Ambulate - Progress: Goal set today  Visit Information  Last PT Received On: 10/08/11 Assistance Needed: +1    Subjective Data  Subjective: I just feel so tired.   Patient Stated Goal: Get better   Prior Functioning  Home Living Lives With: Significant other Additional Comments: pt to go to SNF as family unable to provide enough A at home.   Prior Function Level of Independence: Independent Able to Take Stairs?: Yes Driving: Yes Communication Communication: No difficulties    Cognition  Overall Cognitive Status: Appears within functional limits for tasks assessed/performed Arousal/Alertness: Awake/alert Orientation Level: Oriented X4 / Intact Behavior During Session: [facility] for tasks performed    Extremity/Trunk Assessment Right Lower Extremity Assessment RLE ROM/Strength/Tone: [facility] for tasks assessed RLE Sensation: WFL - Light Touch Left Lower Extremity Assessment LLE ROM/Strength/Tone: WFL for tasks assessed LLE Sensation: WFL - Light Touch Trunk  Assessment  Trunk Assessment: Normal   Balance Balance Balance Assessed: No  End of Session PT - End of Session Equipment Utilized During Treatment: Gait belt Activity Tolerance: Patient limited by fatigue Patient left: in chair;with call bell/phone within reach Nurse Communication: Mobility status  GP     Sunny Schlein, [place] [phone] 10/08/2011, 11:20 AM

## 2011-10-08 NOTE — Progress Notes (Signed)
Patient did not feel up to ambulating this evening but states that she will "do better" tomorrow.  Will continue to monitor and encourage.  Theresa Crosby

## 2011-10-08 NOTE — Progress Notes (Signed)
CARDIAC REHAB PHASE I   PRE:  Rate/Rhythm: 105ST  BP:  Supine:   Sitting: 150/90  Standing:    SaO2: 97%RA  MODE:  Ambulation: 350 ft   POST:  Rate/Rhythem: 117ST  BP:  Supine:   Sitting: 166/93  Standing:    SaO2: 97%RA 1007-1045 Pt did better today. Walked 350 ft on RA with rolling walker and asst x 2. Can be asst x 1. Tolerated increase in distance well. Tired by end of walk. To recliner with call bell. Was incontinent of urine prior to walk. Helped pt get cleaned and put on mesh panty with pad. Pt states she has urinary incont at home.  Theresa Crosby

## 2011-10-08 NOTE — Progress Notes (Signed)
Pt ct sutures dcd as ordered. Pt tolerated well steri strips applied. Velvie Thomaston, Randall An RN

## 2011-10-08 NOTE — Discharge Summary (Signed)
Physician Discharge Summary  Patient ID: Theresa Crosby MRN: [id number] DOB/AGE: 76-Dec-1934 76 y.o.  Admit date: 10/02/2011 Discharge date: 10/08/2011  Admission Diagnoses: 1.Multivessel CAD 2.History of hypertension 3.History of dyslipidemia 4.History of GERD 5.History of arthritis 6.History of neuromuscular disorder (left leg burning sensation and numbness of toes) 7.History of anxiety  Discharge Diagnoses:  1.Multivessel CAD 2.History of hypertension 3.History of dyslipidemia 4.History of GERD 5.History of arthritis 6.History of neuromuscular disorder (left leg burning sensation and numbness of toes) 7.History of anxiety 8.DM (HGA1C 6.2  Procedure (s):  1.Cardiac catheterization done by Dr. Allyson Sabal on 10/02/2011: (1.) Left main; 20% distal taper to  (2.) LAD; 99% calcified eccentric proximal at the takeoff of the first diagonal branch. The first diagonal branch was a moderate size vessel and was bifurcating. The origin and first third of the vessel had a long 80+ percent stenosis.  (3.) Left circumflex; 80% mid OM 1.  (4.) Right coronary artery; dominant with at most 40% mid  (5.)LIMA and RIMA were subselectively visualized and widely patent. Each was suitable for use during coronary bypass grafting if required.  (6). Left ventriculography; RAO left ventriculogram was performed using  25 mL of Visipaque dye at 12 mL/second. The overall LVEF estimated  60 % Without wall motion abnormalities  (7.) Distal abdominal aortogram-the renal arteries will grow a patent. The infrarenal WR and iliac bifurcation were free of significant atherosclerotic changes.  2.Emergent median sternotomy, extracorporeal circulation,  coronary artery bypass graft surgery x4 using a left internal mammary artery graft to the left anterior descending coronary artery, with a saphenous vein graft to diagonal branch of the LAD, a saphenous vein graft to the second obtuse marginal branch of left circumflex  coronary artery, and a saphenous vein graft to the right coronary artery with endoscopic vein harvesting from the right leg by Dr. Laneta Simmers on 10/04/2011.  History of Presenting Illness: This his a 78 year old African American woman who recently presented to her primary physician with complaints of exertional substernal chest discomfort and shortness of breath, as well as exertional fatigue and tiredness. 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 She had associated dizziness and nausea and felt like she might pass out. She underwent a vasodilator stress test and results showed mild mid anterior and apical anterior reversible ischemia with normal left ventricular systolic function. She then underwent a cardiac catheterization on 10/02/2011 which showed severe three-vessel coronary disease with a 99% proximal LAD stenosis.  A cardio thoracic consultation was obtained with Dr. Laneta Simmers for the consideration of coronary artery bypass grafting surgery. Pre operative carotid duplex US showed no significant internal carotid artery stenosis bilaterally.Potential risks, benefits, and complications were discussed with the patient and she agreed to proceed. She underwent an emergent CABG on 10/04/2011.  Brief Hospital Course:  She was extubated successfully early the evening of surgery.She remained afebrile and hemodynamically stable.Her Theone Murdoch, a line, chest tubes, and foley were all removed early in her post operative course.She was started on low dose Lopressor.She was found to have mild ABL anemia. Her H and H went as low as 11.2 and 33.She was volume overloaded and diuresed accordingly. She was then started on Norvasc for better blood pressure control.She was felt surgically stable for transfer from the ICU to PCTU for further convalescence on 10/06/2011.She did develop nausea but no emesis.She has been tolerating a diet but has not had a bowel movement yet. She will  be given a laxative. Epicardial pacing wires and chest tube sutures will be removed in the morning.She is ambulating well on room air. Provided she remains afebrile, hemodynamically stable, and pending morning round evaluation, she will be surgically stable for discharge to a SNF on 10/08/2011.  Latest Vital Signs: Blood pressure 156/90, pulse 106, temperature 98.5 F (36.9 C), temperature source Oral, resp. rate 18, height 4\' 11"  (1.499 m), weight 124 lb 12.5 oz (56.6 kg), SpO2 95.00%.  Physical Exam: General appearance: alert, cooperative and no distress  Heart: regular rate and rhythm  Lungs: clear to auscultation bilaterally  Abdomen: soft, non-tender; bowel sounds normal; no masses, no organomegaly  Extremities: edema trace  Wound: clean and dry  Discharge Condition:Stable  Recent laboratory studies:  Lab Results  Component Value Date   WBC 12.6* 10/07/2011   HGB 11.8* 10/07/2011   HCT 34.9* 10/07/2011   MCV 90.6 10/07/2011   PLT 208 10/07/2011   Lab Results  Component Value Date   NA 136 10/07/2011   K 5.3* 10/07/2011   CL 104 10/07/2011   CO2 24 10/07/2011   CREATININE 0.79 10/07/2011   GLUCOSE 97 10/07/2011      Diagnostic Studies: Dg Chest 2 View  10/07/2011  *RADIOLOGY REPORT*  Clinical Data: CABG, weakness.  CHEST - 2 VIEW  Comparison: 10/05/2011  Findings: Interval removal of bilateral chest tubes and Swan-Ganz catheter.  No pneumothorax.  Increasing right basilar atelectasis and small right effusion.  Mild cardiomegaly.  IMPRESSION: No pneumothorax following bilateral chest tube removal.  Increasing right base atelectasis and right effusion.   Original Report Authenticated By: Cyndie Chime, M.D.     Discharge Orders    Future Appointments: Provider: Department: Dept Phone: Center:   10/27/2011 1:30 PM Alleen Borne, MD [facility] [phone] [facility]      Discharge Medications: Medication List  As of 10/08/2011 12:26 PM   TAKE these medications          amLODipine 5 MG tablet   Commonly known as: NORVASC   Take 5 mg by mouth daily.      aspirin 325 MG EC tablet   Take 1 tablet (325 mg total) by mouth daily.      benzonatate 200 MG capsule   Commonly known as: TESSALON   Take 1 capsule (200 mg total) by mouth 3 (three) times daily as needed for cough.      brimonidine 0.1 % Soln   Commonly known as: ALPHAGAN P   Place 1 drop into both eyes 2 (two) times daily.      Calcium-Vitamin D-Vitamin K 500-100-40 MG-UNT-MCG Chew   Chew 1 tablet by mouth 2 (two) times daily.      conjugated estrogens vaginal cream   Commonly known as: PREMARIN   Place 0.5 g vaginally 2 (two) times a week. Thursday & sunday      cycloSPORINE 0.05 % ophthalmic emulsion   Commonly known as: RESTASIS   Place 1 drop into both eyes 2 (two) times daily.      estradiol 0.0375 MG/24HR   Commonly known as: VIVELLE-DOT   Place 1 patch onto the skin 2 (two) times a week. Thursday & Sunday      FRESHKOTE 2.7-2 % Soln   Generic drug: Polyvinyl Alcohol-Povidone   Place 1 drop into both eyes 3 (three) times daily.      furosemide 20 MG tablet   Commonly known as: LASIX   Take 1 tablet (20 mg total) by mouth  daily. For 5 days then stop.      latanoprost 0.005 % ophthalmic solution   Commonly known as: XALATAN   Place 1 drop into both eyes at bedtime.      metoprolol tartrate 25 MG tablet   Commonly known as: LOPRESSOR   Take 1 tablet (25 mg total) by mouth 2 (two) times daily.      potassium chloride 10 MEQ tablet   Commonly known as: K-DUR,KLOR-CON   Take 1 tablet (10 mEq total) by mouth every other day.      PRILOSEC PO   Take 1 capsule by mouth daily.      simvastatin 20 MG tablet   Commonly known as: ZOCOR   Take 1 tablet (20 mg total) by mouth daily at 6 PM.      traMADol 50 MG tablet   Commonly known as: ULTRAM   Take 1 tablet (50 mg total) by mouth every 6 (six) hours as needed for pain.      Vitamin D3 1000 UNITS Caps   Take 2,000 Units by  mouth every morning.           She was not put on an ACE or ARB as she has a preserved LV function and BP was well controlled with Lopressor and Norvasc.  Follow Up Appointments: Follow-up Information    Follow up with Thurmon Fair, MD. (office will call you)    Contact information:   [address] [place] [address] [phone]       Follow up with Alleen Borne, MD. (PA/LAT CXR to be taken (at [facility] which is in the same building as Dr. Sharee Pimple office) on 10/27/2011 at 12:30 pm;Appointment with Dr. Laneta Simmers is on  10/27/2011 at 12:30 pm)    Contact information:   [address] [place] [address] [phone]       Follow up with Medical Doctor. (Call for a follow up appointment regarding HGA1C 5.8)          Signed: ZIMMERMAN,DONIELLE MPA-C 10/08/2011, 12:26 PM

## 2011-10-08 NOTE — Progress Notes (Signed)
Pt discharged to SNF via family, packet sent with patient. Verenis Nicosia, Randall An RN

## 2011-10-08 NOTE — Progress Notes (Signed)
4 Days Post-Op Procedure(s) (LRB): CORONARY ARTERY BYPASS GRAFTING (CABG) (N/A)  Subjective:  Theresa Crosby complains of being sore across her chest this morning.  Her nausea has improved, but continues to have decreased PO intake.  + Ambulation, No BM  Objective: Vital signs in last 24 hours: Temp:  [97.6 F (36.4 C)-98.9 F (37.2 C)] 98.5 F (36.9 C) (08/22 0525) Pulse Rate:  [85-104] 104  (08/22 0525) Cardiac Rhythm:  [-] Normal sinus rhythm;Sinus tachycardia (08/21 2045) Resp:  [18] 18  (08/22 0525) BP: (139-164)/(64-102) 142/64 mmHg (08/22 0719) SpO2:  [95 %-100 %] 95 % (08/22 0525) Weight:  [124 lb 12.5 oz (56.6 kg)] 124 lb 12.5 oz (56.6 kg) (08/22 0525)  Intake/Output from previous day: 08/21 0701 - 08/22 0700 In: 240 [P.O.:240] Out: 4550 [Urine:4550] Intake/Output this shift: Total I/O In: -  Out: 275 [Urine:275]  General appearance: alert, cooperative and no distress Heart: regular rate and rhythm Lungs: clear to auscultation bilaterally Abdomen: soft, non-tender; bowel sounds normal; no masses,  no organomegaly Extremities: edema 1+ Wound: clean, some clear drainage from sternotomy, no evidence of infection  Lab Results:  Basename 10/07/11 0610 10/05/11 1745 10/05/11 1742  WBC 12.6* -- 12.6*  HGB 11.8* 12.2 --  HCT 34.9* 36.0 --  PLT 208 -- 195   BMET:  Basename 10/07/11 0610 10/06/11 0645  NA 136 137  K 5.3* 4.6  CL 104 105  CO2 24 22  GLUCOSE 97 88  BUN 25* 22  CREATININE 0.79 1.08  CALCIUM 9.2 8.7    PT/INR: No results found for this basename: LABPROT,INR in the last 72 hours ABG    Component Value Date/Time   PHART 7.296* 10/04/2011 1932   HCO3 21.9 10/04/2011 1932   TCO2 23 10/05/2011 1745   ACIDBASEDEF 4.0* 10/04/2011 1932   O2SAT 98.0 10/04/2011 1932   CBG (last 3)   Basename 10/06/11 1131 10/06/11 0753 10/06/11 0315  GLUCAP 104* 66* 87    Assessment/Plan: S/P Procedure(s) (LRB): CORONARY ARTERY BYPASS GRAFTING (CABG) (N/A)  1. CV-  NSR, tachy and mildly hypertensive, will increase Lopressor to 25mg  BID 2. Pulmonary- encouraged IS, + cough on Tessalon prn 3. Volume Overload- weight is up 5kg since admission, + LE edema- started low dose Lasix yesterday 4. LOC constipation- will order Mag Citrate, will not give Lactulose or MOM due to lactose intolerance 5. Dispo- patient doing well, will d/c EPW today.  Try mag citrate for BM, will address possible d/c this afternoon or tomorrow morning   LOS: 6 days    Lowella Dandy 10/08/2011

## 2011-10-08 NOTE — Progress Notes (Signed)
[id number] Cardiac Rehab Completed discharge education with pt and family. Pt agrees to Outpt. CRP in [place], will send referral.

## 2011-10-09 NOTE — Clinical Social Work Psychosocial (Signed)
Clinical Social Work Department BRIEF PSYCHOSOCIAL ASSESSMENT 10/09/2011  Patient:  Theresa Crosby, Theresa Crosby     Account Number:  [id number]     Admit date:  10/02/2011  Clinical Social Worker:  Read Drivers  Date/Time:  10/08/2011 10:20 AM  Referred by:  RN  Date Referred:  10/08/2011 Referred for  SNF Placement   Other Referral:   none   Interview type:  Other - See comment Other interview type:   Pt, husband and daughter    PSYCHOSOCIAL DATA Living Status:  HUSBAND Admitted from facility:   Level of care:   Primary support name:  husband Primary support relationship to patient:  SPOUSE Degree of support available:   good    CURRENT CONCERNS Current Concerns  Post-Acute Placement   Other Concerns:   none    SOCIAL WORK ASSESSMENT / PLAN CSW assessed pt at bedside.  Upon d/c home pt realized that SNF may be a better option.  Pt req [facility].  CSW called [facility] and pt was able to be admitted to SNF. Pt daughter and husband at bedside.  Pt daughter is a Financial controller and is with pt until services are no longer needed then she will return to work.  Pt has supportive husband as well by beside.  Husband works Teacher, English as a foreign language.   Assessment/plan status:  Psychosocial Support/Ongoing Assessment of Needs Other assessment/ plan:   none   Information/referral to community resources:   SNF    PATIENT'S/FAMILY'S RESPONSE TO PLAN OF CARE: Pt and family was appreciative of CSW involvement in placement.        Vickii Penna, LCSWA [phone]  Clinical Social Work

## 2011-10-09 NOTE — Clinical Social Work Placement (Signed)
Clinical Social Work Department CLINICAL SOCIAL WORK PLACEMENT NOTE 10/09/2011  Patient:  Theresa Crosby, Theresa Crosby  Account Number:  [id number] Admit date:  10/02/2011  Clinical Social Worker:  Read Drivers  Date/time:  10/08/2011 10:26 AM  Clinical Social Work is seeking post-discharge placement for this patient at the following level of care:   SKILLED NURSING   (*CSW will update this form in Epic as items are completed)   10/08/2011  Patient/family provided with [facility] Department of Clinical Social Work's list of facilities offering this level of care within the geographic area requested by the patient (or if unable, by the patient's family).  10/08/2011  Patient/family informed of their freedom to choose among providers that offer the needed level of care, that participate in Medicare, Medicaid or managed care program needed by the patient, have an available bed and are willing to accept the patient.  10/08/2011  Patient/family informed of [facility]' ownership interest in [facility], as well as of the fact that they are under no obligation to receive care at this facility.  PASARR submitted to EDS on 10/08/2011 PASARR number received from EDS on 10/08/2011  FL2 transmitted to all facilities in geographic area requested by pt/family on  10/08/2011 FL2 transmitted to all facilities within larger geographic area on   Patient informed that his/her managed care company has contracts with or will negotiate with  certain facilities, including the following:     Patient/family informed of bed offers received:  10/08/2011 Patient chooses bed at [facility] Physician recommends and patient chooses bed at    Patient to be transferred to [facility] on  10/08/2011 Patient to be transferred to facility by family  The following physician request were entered in Epic:   Additional Comments: none  Vickii Penna, LCSWA [phone]  Clinical Social Work

## 2011-10-12 HISTORY — PX: CARDIAC CATHETERIZATION: SHX172

## 2011-10-16 DIAGNOSIS — Z0279 Encounter for issue of other medical certificate: Secondary | ICD-10-CM

## 2011-10-26 ENCOUNTER — Other Ambulatory Visit: Payer: Self-pay | Admitting: Surgery

## 2011-10-26 DIAGNOSIS — Z951 Presence of aortocoronary bypass graft: Secondary | ICD-10-CM

## 2011-10-27 ENCOUNTER — Ambulatory Visit
Admission: RE | Admit: 2011-10-27 | Discharge: 2011-10-27 | Disposition: A | Discharge status: Home / Self Care | Payer: BC Managed Care – PPO | Source: Ambulatory Visit | Attending: Surgery | Admitting: Surgery

## 2011-10-27 ENCOUNTER — Encounter: Payer: Self-pay | Admitting: Surgery

## 2011-10-27 ENCOUNTER — Ambulatory Visit (INDEPENDENT_AMBULATORY_CARE_PROVIDER_SITE_OTHER): Payer: Self-pay | Admitting: Surgery

## 2011-10-27 VITALS — BP 137/90 | HR 96 | Resp 20 | Ht 59.0 in | Wt 105.0 lb

## 2011-10-27 DIAGNOSIS — Z951 Presence of aortocoronary bypass graft: Secondary | ICD-10-CM

## 2011-10-27 DIAGNOSIS — I2510 Atherosclerotic heart disease of native coronary artery without angina pectoris: Secondary | ICD-10-CM

## 2011-10-27 NOTE — Progress Notes (Signed)
[address]            [name] [address]          [phone]      HPI:  Patient returns for routine postoperative follow-up having undergone coronary bypass graft surgery x4 on 10/04/2011. The patient's early postoperative recovery while in the hospital was notable for an uncomplicated postoperative course. Since hospital discharge the patient reports she has had a slow recovery. She has still been taking some pain medicine for incisional discomfort and tightness in her chest. She's also had problems with constipation. Her biggest complaint appears to be of some numbness and tingling in her legs which she had prior surgery and she does have a history of spinal stenosis. She walks using a walker most the time. She denies any shortness of breath.   Current Outpatient Prescriptions  Medication Sig Dispense Refill  . amLODipine (NORVASC) 5 MG tablet Take 5 mg by mouth daily.        . brimonidine (ALPHAGAN P) 0.1 % SOLN Place 1 drop into both eyes 2 (two) times daily.        . Calcium-Vitamin D-Vitamin K 500-100-40 MG-UNT-MCG CHEW Chew 1 tablet by mouth 2 (two) times daily.        . Cholecalciferol (VITAMIN D3) 1000 UNITS CAPS Take 2,000 Units by mouth every morning.        . conjugated estrogens (PREMARIN) vaginal cream Place 0.5 g vaginally 2 (two) times a week. Thursday & sunday       . cycloSPORINE (RESTASIS) 0.05 % ophthalmic emulsion Place 1 drop into both eyes 2 (two) times daily.      Marland Kitchen estradiol (VIVELLE-DOT) 0.0375 MG/24HR Place 1 patch onto the skin 2 (two) times a week. Thursday & Sunday       . furosemide (LASIX) 20 MG tablet Take 1 tablet (20 mg total) by mouth daily. For 5 days then stop.  5 tablet  0  . latanoprost (XALATAN) 0.005 % ophthalmic solution Place 1 drop into both eyes at bedtime.        . metoprolol tartrate (LOPRESSOR) 25 MG tablet Take 1 tablet (25 mg total) by mouth 2 (two) times daily.  60 tablet  1  . Omeprazole (PRILOSEC  PO) Take 1 capsule by mouth daily.        . pantoprazole (PROTONIX) 40 MG tablet Take 40 mg by mouth daily.      . simvastatin (ZOCOR) 20 MG tablet Take 1 tablet (20 mg total) by mouth daily at 6 PM.  30 tablet  1  . traMADol (ULTRAM) 50 MG tablet Take 50 mg by mouth every 6 (six) hours as needed.      . traZODone (DESYREL) 50 MG tablet Take 50 mg by mouth at bedtime. 1/2 tablet at night        Physical Exam: BP 137/90  Pulse 96  Resp 20  Ht 4\' 11"  (1.499 m)  Wt 105 lb (47.628 kg)  BMI 21.21 kg/m2  SpO2 95% She looks well. Cardiac exam shows a regular rate and rhythm with normal heart sounds. Lung exam is clear. The chest incision is healing well and sternum is stable. There is no peripheral edema.  Diagnostic Tests:  *RADIOLOGY REPORT*   Clinical Data: Shortness of breath, chest pressure   CHEST - 2 VIEW   Comparison: Chest x-ray of 10/07/2011  Findings: Aeration of the right lung base has improved with persistent elevation of the right hemidiaphragm.  There may be mild right pleural thickening versus small pleural effusion blunting the posterior costophrenic angle on the right.  Cardiomegaly is stable. Median sternotomy sutures are noted from CABG.  A mild thoracolumbar scoliosis is stable.   IMPRESSION: It improved aeration of the right lung base.  Cannot exclude small right effusion.  Stable cardiomegaly.     Original Report Authenticated By: Juline Patch, M.D.   Impression:  Overall I think she is making a slow but steady recovery following surgery. I encouraged her to continue walking as much as possible. I wrote her a prescription today for Ultram 50 mg every 6 hours when necessary for pain #40. I told her she could return to driving a car when she feels comfortable with that but should refrain from lifting anything heavier than 10 pounds for a total of 3 months from the date of surgery.  Plan:  She will continue followup with her primary physician as well  as Dr. Royann Shivers. She will contact me if she develops any problems with her incisions. She is going to follow up with her neurosurgeon, Dr. Newell Coral, concerning her leg numbness.

## 2011-10-30 NOTE — OR Nursing (Signed)
Late entry to document procedure start time. 

## 2011-11-01 ENCOUNTER — Emergency Department (HOSPITAL_COMMUNITY): Payer: BC Managed Care – PPO

## 2011-11-01 ENCOUNTER — Observation Stay (HOSPITAL_COMMUNITY)
Admission: EM | Admit: 2011-11-01 | Discharge: 2011-11-02 | Disposition: A | Discharge status: Home / Self Care | Payer: BC Managed Care – PPO | Attending: Internal Medicine | Admitting: Internal Medicine

## 2011-11-01 ENCOUNTER — Encounter (HOSPITAL_COMMUNITY): Payer: Self-pay | Admitting: *Deleted

## 2011-11-01 DIAGNOSIS — R9439 Abnormal result of other cardiovascular function study: Secondary | ICD-10-CM

## 2011-11-01 DIAGNOSIS — R079 Chest pain, unspecified: Secondary | ICD-10-CM | POA: Diagnosis present

## 2011-11-01 DIAGNOSIS — I10 Essential (primary) hypertension: Secondary | ICD-10-CM | POA: Diagnosis present

## 2011-11-01 DIAGNOSIS — I2510 Atherosclerotic heart disease of native coronary artery without angina pectoris: Principal | ICD-10-CM | POA: Diagnosis present

## 2011-11-01 DIAGNOSIS — R0602 Shortness of breath: Secondary | ICD-10-CM | POA: Insufficient documentation

## 2011-11-01 DIAGNOSIS — B370 Candidal stomatitis: Secondary | ICD-10-CM | POA: Diagnosis present

## 2011-11-01 DIAGNOSIS — M1990 Unspecified osteoarthritis, unspecified site: Secondary | ICD-10-CM | POA: Insufficient documentation

## 2011-11-01 DIAGNOSIS — G629 Polyneuropathy, unspecified: Secondary | ICD-10-CM | POA: Diagnosis present

## 2011-11-01 DIAGNOSIS — E785 Hyperlipidemia, unspecified: Secondary | ICD-10-CM | POA: Diagnosis present

## 2011-11-01 DIAGNOSIS — G5790 Unspecified mononeuropathy of unspecified lower limb: Secondary | ICD-10-CM | POA: Insufficient documentation

## 2011-11-01 DIAGNOSIS — Z951 Presence of aortocoronary bypass graft: Secondary | ICD-10-CM

## 2011-11-01 LAB — CBC WITH DIFFERENTIAL/PLATELET
Basophils Absolute: 0 10*3/uL (ref 0.0–0.1)
Basophils Relative: 0 % (ref 0–1)
Eosinophils Absolute: 0.2 10*3/uL (ref 0.0–0.7)
Eosinophils Relative: 3 % (ref 0–5)
HCT: 37.3 % (ref 36.0–46.0)
Hemoglobin: 12.7 g/dL (ref 12.0–15.0)
Lymphocytes Relative: 23 % (ref 12–46)
Lymphs Abs: 2 10*3/uL (ref 0.7–4.0)
MCH: 30.9 pg (ref 26.0–34.0)
MCHC: 34 g/dL (ref 30.0–36.0)
MCV: 90.8 fL (ref 78.0–100.0)
Monocytes Absolute: 0.9 10*3/uL (ref 0.1–1.0)
Monocytes Relative: 11 % (ref 3–12)
Neutro Abs: 5.6 10*3/uL (ref 1.7–7.7)
Neutrophils Relative %: 63 % (ref 43–77)
Platelets: 325 10*3/uL (ref 150–400)
RBC: 4.11 MIL/uL (ref 3.87–5.11)
RDW: 15 % (ref 11.5–15.5)
WBC: 8.8 10*3/uL (ref 4.0–10.5)

## 2011-11-01 LAB — COMPREHENSIVE METABOLIC PANEL
ALT: 16 U/L (ref 0–35)
AST: 22 U/L (ref 0–37)
Albumin: 3.8 g/dL (ref 3.5–5.2)
Alkaline Phosphatase: 109 U/L (ref 39–117)
BUN: 7 mg/dL (ref 6–23)
CO2: 28 mEq/L (ref 19–32)
Calcium: 10.2 mg/dL (ref 8.4–10.5)
Chloride: 96 mEq/L (ref 96–112)
Creatinine, Ser: 0.58 mg/dL (ref 0.50–1.10)
GFR calc Af Amer: >90 mL/min (ref >90)
GFR calc non Af Amer: 87 mL/min — ABNORMAL LOW (ref >90)
Glucose, Bld: 97 mg/dL (ref 70–99)
Potassium: 4.4 mEq/L (ref 3.5–5.1)
Sodium: 134 mEq/L — ABNORMAL LOW (ref 135–145)
Total Bilirubin: 0.4 mg/dL (ref 0.3–1.2)
Total Protein: 7.5 g/dL (ref 6.0–8.3)

## 2011-11-01 LAB — POCT I-STAT, CHEM 8
BUN: 6 mg/dL (ref 6–23)
Calcium, Ion: 1.25 mmol/L (ref 1.13–1.30)
Chloride: 100 mEq/L (ref 96–112)
Creatinine, Ser: 0.8 mg/dL (ref 0.50–1.10)
Glucose, Bld: 102 mg/dL — ABNORMAL HIGH (ref 70–99)
HCT: 39 % (ref 36.0–46.0)
Hemoglobin: 13.3 g/dL (ref 12.0–15.0)
Potassium: 4.5 mEq/L (ref 3.5–5.1)
Sodium: 135 mEq/L (ref 135–145)
TCO2: 26 mmol/L (ref 0–100)

## 2011-11-01 LAB — POCT I-STAT TROPONIN I: Troponin i, poc: 0.03 ng/mL (ref 0.00–0.08)

## 2011-11-01 MED ORDER — SODIUM CHLORIDE 0.9 % IV SOLN
INTRAVENOUS | Status: DC
  Administered 2011-11-02: 01:00:00 via INTRAVENOUS

## 2011-11-01 NOTE — ED Provider Notes (Signed)
History     CSN: [id number]  Arrival date & time 11/01/11  2243   First MD Initiated Contact with Patient 11/01/11 2324      Chief Complaint  Patient presents with  . Chest Pain    (Consider location/radiation/quality/duration/timing/severity/associated sxs/prior treatment) HPI Comments: 78 year old female with a history of hypertension, hypercholesterolemia, recent cardiac bypass 4 vessels performed approximately 4 weeks ago who presents with a complaint of left-sided chest pain. She describes this as severe, persistent ever since 7:00 AM when it woke her up today, worse with any movement including movement of the arm or palpation of the chest and associated with shortness of breath in that it hurts more when she breathes. She denies any swelling in her legs, denies coughing fevers chills nausea or vomiting. The pain is sharp and electric-like and sometimes feels like somebody is just stepping on her chest.  Patient is a 78 y.o. female presenting with chest pain. The history is provided by the patient and medical records.  Chest Pain     Past Medical History  Diagnosis Date  . Hypertension     ireg. heartbeat, no referral made to cardiologist , sees [facility]. Med.   . Shortness of breath   . Blood transfusion     postop- back surg. 1987  . GERD (gastroesophageal reflux disease)   . Arthritis     degeneration - spondylosis, OA - all over body,   . Neuromuscular disorder     burning- L leg , numbing of toes   . Anxiety     2005- related to care of aging parent   . Chest pain at rest associated with dyspnea 10/05/2011  . Abnormal nuclear stress test 10/05/2011  . CAD (coronary artery disease)significant diseas with 99% LAD, 80% 1st diag, 80% LCX 10/05/2011  . S/P CABG x 4, LIMA-LAD, SVG-OM2, SVG-diag, SVG-RCA secondary to symptomatic CAD and positive stress test 10/05/2011    Past Surgical History  Procedure Date  . Back surgery     1987-   . Eye surgery     cataracts removed-  bilateral, no IOL  . Abdominal hysterectomy     partial hysterectomy- 1972  . Tubal ligation   . Toe surgery     R foot- removed calus between 5-4  . Lumbar laminectomy/decompression microdiscectomy 03/04/2011    Procedure: LUMBAR LAMINECTOMY/DECOMPRESSION MICRODISCECTOMY;  Surgeon: Hewitt Shorts, MD;  Location: [facility];  Service: Neurosurgery;  Laterality: N/A;  Thoracic Ten-Thoracic Twelve Thoracic Laminectomy  . Coronary artery bypass graft 10/04/2011    Procedure: CORONARY ARTERY BYPASS GRAFTING (CABG);  Surgeon: Alleen Borne, MD;  [facility];  Service: Open Heart Surgery;  Laterality: N/A;  CABG x four using left internal mammary artery and right leg greater saphenous vein     Family History  Problem Relation Age of Onset  . Anesthesia problems Neg Hx   . Hypotension Neg Hx   . Malignant hyperthermia Neg Hx   . Pseudochol deficiency Neg Hx     History  Substance Use Topics  . Smoking status: Never Smoker   . Smokeless tobacco: Not on file  . Alcohol Use: No    OB History    Grav Para Term Preterm Abortions TAB SAB Ect Mult Living                  Review of Systems  Cardiovascular: Positive for chest pain.  All other systems reviewed and are negative.    Allergies  Lactose  intolerance (gi)  Home Medications   Current Outpatient Rx  Name Route Sig Dispense Refill  . ASPIRIN EC 81 MG PO TBEC Oral Take 1 tablet (81 mg total) by mouth daily. 100 tablet 0  . FLUCONAZOLE 100 MG PO TABS Oral Take 1 tablet (100 mg total) by mouth daily. 7 tablet 0  . HYDROCODONE-ACETAMINOPHEN 5-325 MG PO TABS Oral Take 1 tablet by mouth every 4 (four) hours as needed. 30 tablet 0  . IBUPROFEN 600 MG PO TABS Oral Take 1 tablet (600 mg total) by mouth 3 (three) times daily with meals. 30 tablet 0  . NITROGLYCERIN 0.4 MG SL SUBL Sublingual Place 1 tablet (0.4 mg total) under the tongue every 5 (five) minutes as needed for chest pain. 100 tablet 0    BP 136/90  Pulse 106   Temp 98.4 F (36.9 C) (Oral)  Resp 20  Ht 4\' 11"  (1.499 m)  Wt 104 lb 9.6 oz (47.446 kg)  BMI 21.13 kg/m2  SpO2 94%  Physical Exam  Nursing note and vitals reviewed. Constitutional: She appears well-developed and well-nourished. No distress.  HENT:  Head: Normocephalic and atraumatic.  Mouth/Throat: Oropharynx is clear and moist. No oropharyngeal exudate.  Eyes: Conjunctivae normal and EOM are normal. Pupils are equal, round, and reactive to light. Right eye exhibits no discharge. Left eye exhibits no discharge. No scleral icterus.  Neck: Normal range of motion. Neck supple. No JVD present. No thyromegaly present.  Cardiovascular: Normal rate, regular rhythm, normal heart sounds and intact distal pulses.  Exam reveals no gallop and no friction rub.   No murmur heard. Pulmonary/Chest: Effort normal and breath sounds normal. No respiratory distress. She has no wheezes. She has no rales. She exhibits tenderness ( Left upper chest wall tenderness).  Abdominal: Soft. Bowel sounds are normal. She exhibits no distension and no mass. There is no tenderness.  Musculoskeletal: Normal range of motion. She exhibits no edema and no tenderness.  Lymphadenopathy:    She has no cervical adenopathy.  Neurological: She is alert. Coordination normal.  Skin: Skin is warm and dry. No rash noted. No erythema.  Psychiatric: She has a normal mood and affect. Her behavior is normal.    ED Course  Procedures (including critical care time)  Labs Reviewed  COMPREHENSIVE METABOLIC PANEL - Abnormal; Notable for the following:    Sodium 134 (*)     GFR calc non Af Amer 87 (*)     All other components within normal limits  POCT I-STAT, CHEM 8 - Abnormal; Notable for the following:    Glucose, Bld 102 (*)     All other components within normal limits  BASIC METABOLIC PANEL - Abnormal; Notable for the following:    BUN 4 (*)     Creatinine, Ser 0.48 (*)     All other components within normal limits  CBC -  Abnormal; Notable for the following:    RBC 3.71 (*)     Hemoglobin 11.2 (*)     HCT 33.1 (*)     All other components within normal limits  CBC WITH DIFFERENTIAL  POCT I-STAT TROPONIN I  TROPONIN I  TROPONIN I  TROPONIN I   Dg Chest 2 View  11/01/2011  *RADIOLOGY REPORT*  Clinical Data: Chest pain.  CHEST - 2 VIEW  Comparison: Plain films of the chest 10/27/2011 and 10/07/2011.  Findings: The patient is status post median sternotomy. Right pleural effusion appears unchanged since the most recent study. Lungs appear clear.  No pneumothorax or pleural fluid.  IMPRESSION: No change in right pleural effusion.  No new abnormality   Original Report Authenticated By: Bernadene Bell. D'ALESSIO, M.D.      1. Chest pain   2. CAD (coronary artery disease)   3. Dyslipidemia   4. HTN (hypertension)   5. CAD with 99% LAD, 80% 1st diag, 80% LCX, NL LVF   6. S/P CABG x 4, LIMA-LAD, SVG-OM2, SVG-diag, SVG-RCA secondary to symptomatic CAD and positive stress test 10/04/11   7. Neuropathy, lower extremities   8. Dyslipidemia, statin added   9. Oral thrush   10. Chest pain at rest associated with dyspnea   11. Abnormal nuclear stress test       MDM  EKG with some nonspecific T wave abnormalities, lab work shows that she has normal blood counts and renal function and a chest x-ray with a right-sided pleural effusion which is unchanged from prior x-rays. Her troponin is negative. I suspect that given her significant chest wall tenderness that she has muscular pain however given her recent coronary abnormalities requiring surgery we'll perform or testing.  ED ECG REPORT  I personally interpreted this EKG   Date: 11/02/2011   Rate: 102  Rhythm: sinus tachycardia  QRS Axis: normal  Intervals: normal  ST/T Wave abnormalities: nonspecific T wave changes  Conduction Disutrbances:none  Narrative Interpretation:   Old EKG Reviewed: Since 10/06/2011, ST elevations in precordial leads and lateral leads have  resolved, T wave inversions in inferior leads have resolved, nonspecific T waves in V2 and V3 now present.   Pt with significant CP though has some reproducible CP - has recent surgical CABG and has neg trop and no sig ECG findings for acute ischemia though it is different from prior - Pt will admitted for observation and r/o.  D/w hospitalist who will admit.      Vida Roller, MD 11/02/11 [phone]

## 2011-11-01 NOTE — ED Notes (Signed)
PT reports CP stared at 0700 this Am. Pain radiated to back ,Lt arm . Pt also reports nausea ,and feels like choking . Pt had a CABG in august 2013

## 2011-11-01 NOTE — ED Notes (Signed)
Pt presented to ED with chest pain more to the left side with SOB.Has pain durning activities.

## 2011-11-02 ENCOUNTER — Encounter (HOSPITAL_COMMUNITY): Payer: Self-pay | Admitting: *Deleted

## 2011-11-02 DIAGNOSIS — I2510 Atherosclerotic heart disease of native coronary artery without angina pectoris: Secondary | ICD-10-CM

## 2011-11-02 DIAGNOSIS — B370 Candidal stomatitis: Secondary | ICD-10-CM | POA: Diagnosis present

## 2011-11-02 DIAGNOSIS — E785 Hyperlipidemia, unspecified: Secondary | ICD-10-CM

## 2011-11-02 DIAGNOSIS — R079 Chest pain, unspecified: Secondary | ICD-10-CM

## 2011-11-02 DIAGNOSIS — I10 Essential (primary) hypertension: Secondary | ICD-10-CM

## 2011-11-02 LAB — BASIC METABOLIC PANEL
BUN: 4 mg/dL — ABNORMAL LOW (ref 6–23)
CO2: 26 mEq/L (ref 19–32)
Calcium: 9 mg/dL (ref 8.4–10.5)
Chloride: 102 mEq/L (ref 96–112)
Creatinine, Ser: 0.48 mg/dL — ABNORMAL LOW (ref 0.50–1.10)
GFR calc Af Amer: >90 mL/min (ref >90)
GFR calc non Af Amer: >90 mL/min (ref >90)
Glucose, Bld: 97 mg/dL (ref 70–99)
Potassium: 3.6 mEq/L (ref 3.5–5.1)
Sodium: 137 mEq/L (ref 135–145)

## 2011-11-02 LAB — CBC
HCT: 33.1 % — ABNORMAL LOW (ref 36.0–46.0)
Hemoglobin: 11.2 g/dL — ABNORMAL LOW (ref 12.0–15.0)
MCH: 30.2 pg (ref 26.0–34.0)
MCHC: 33.8 g/dL (ref 30.0–36.0)
MCV: 89.2 fL (ref 78.0–100.0)
Platelets: 271 10*3/uL (ref 150–400)
RBC: 3.71 MIL/uL — ABNORMAL LOW (ref 3.87–5.11)
RDW: 14.9 % (ref 11.5–15.5)
WBC: 7.2 10*3/uL (ref 4.0–10.5)

## 2011-11-02 LAB — TROPONIN I
Troponin I: <0.3 ng/mL (ref <0.30)
Troponin I: <0.3 ng/mL (ref <0.30)

## 2011-11-02 MED ORDER — NITROGLYCERIN 0.4 MG SL SUBL: 0.4000 mg | SUBLINGUAL_TABLET | SUBLINGUAL | Status: DC | PRN

## 2011-11-02 MED ORDER — SODIUM CHLORIDE 0.9 % IV SOLN: 250.0000 mL | INTRAVENOUS | Status: DC | PRN

## 2011-11-02 MED ORDER — ALBUTEROL SULFATE (5 MG/ML) 0.5% IN NEBU: 2.5000 mg | INHALATION_SOLUTION | RESPIRATORY_TRACT | Status: DC | PRN

## 2011-11-02 MED ORDER — IPRATROPIUM BROMIDE 0.02 % IN SOLN: 0.5000 mg | RESPIRATORY_TRACT | Status: DC | PRN

## 2011-11-02 MED ORDER — HYDROCODONE-ACETAMINOPHEN 5-325 MG PO TABS
1.0000 | ORAL_TABLET | ORAL | Status: DC | PRN
  Administered 2011-11-02: 1 via ORAL
  Filled 2011-11-02: qty 2

## 2011-11-02 MED ORDER — IBUPROFEN 600 MG PO TABS
600.0000 mg | ORAL_TABLET | Freq: Three times a day (TID) | ORAL | Status: DC
  Filled 2011-11-02 (×3): qty 1

## 2011-11-02 MED ORDER — KETOROLAC TROMETHAMINE 30 MG/ML IJ SOLN
30.0000 mg | Freq: Four times a day (QID) | INTRAMUSCULAR | Status: DC | PRN
  Filled 2011-11-02: qty 1

## 2011-11-02 MED ORDER — FLUCONAZOLE 100 MG PO TABS: 100.0000 mg | ORAL_TABLET | Freq: Every day | ORAL | Status: DC

## 2011-11-02 MED ORDER — LATANOPROST 0.005 % OP SOLN
1.0000 [drp] | Freq: Every day | OPHTHALMIC | Status: DC
  Filled 2011-11-02: qty 2.5

## 2011-11-02 MED ORDER — SODIUM CHLORIDE 0.9 % IJ SOLN
3.0000 mL | Freq: Two times a day (BID) | INTRAMUSCULAR | Status: DC
  Administered 2011-11-02: 3 mL via INTRAVENOUS

## 2011-11-02 MED ORDER — SENNOSIDES-DOCUSATE SODIUM 8.6-50 MG PO TABS
1.0000 | ORAL_TABLET | Freq: Every evening | ORAL | Status: DC | PRN
  Filled 2011-11-02: qty 1

## 2011-11-02 MED ORDER — ASPIRIN 325 MG PO TABS
325.0000 mg | ORAL_TABLET | Freq: Every day | ORAL | Status: DC
  Administered 2011-11-02: 325 mg via ORAL
  Filled 2011-11-02: qty 1

## 2011-11-02 MED ORDER — IBUPROFEN 600 MG PO TABS: 600.0000 mg | ORAL_TABLET | Freq: Three times a day (TID) | ORAL | Status: DC

## 2011-11-02 MED ORDER — HYDROCODONE-ACETAMINOPHEN 5-325 MG PO TABS: 1.0000 | ORAL_TABLET | ORAL | Status: DC | PRN

## 2011-11-02 MED ORDER — ONDANSETRON HCL 4 MG/2ML IJ SOLN: 4.0000 mg | Freq: Four times a day (QID) | INTRAMUSCULAR | Status: DC | PRN

## 2011-11-02 MED ORDER — BRIMONIDINE TARTRATE 0.2 % OP SOLN
1.0000 [drp] | Freq: Two times a day (BID) | OPHTHALMIC | Status: DC
  Administered 2011-11-02: 1 [drp] via OPHTHALMIC
  Filled 2011-11-02: qty 5

## 2011-11-02 MED ORDER — ONDANSETRON HCL 4 MG PO TABS: 4.0000 mg | ORAL_TABLET | Freq: Four times a day (QID) | ORAL | Status: DC | PRN

## 2011-11-02 MED ORDER — AMLODIPINE BESYLATE 5 MG PO TABS
5.0000 mg | ORAL_TABLET | Freq: Every day | ORAL | Status: DC
Start: 2011-11-02 — End: 2011-11-02
  Administered 2011-11-02: 5 mg via ORAL
  Filled 2011-11-02: qty 1

## 2011-11-02 MED ORDER — KETOROLAC TROMETHAMINE 30 MG/ML IJ SOLN
30.0000 mg | Freq: Once | INTRAMUSCULAR | Status: AC
  Administered 2011-11-02: 30 mg via INTRAVENOUS
  Filled 2011-11-02: qty 1

## 2011-11-02 MED ORDER — ZOLPIDEM TARTRATE 5 MG PO TABS: 5.0000 mg | ORAL_TABLET | Freq: Every evening | ORAL | Status: DC | PRN

## 2011-11-02 MED ORDER — METOPROLOL TARTRATE 1 MG/ML IV SOLN
5.0000 mg | Freq: Once | INTRAVENOUS | Status: AC
  Administered 2011-11-02: 5 mg via INTRAVENOUS
  Filled 2011-11-02: qty 5

## 2011-11-02 MED ORDER — CYCLOSPORINE 0.05 % OP EMUL
1.0000 [drp] | Freq: Two times a day (BID) | OPHTHALMIC | Status: DC
  Administered 2011-11-02: 1 [drp] via OPHTHALMIC
  Filled 2011-11-02 (×2): qty 1

## 2011-11-02 MED ORDER — MORPHINE SULFATE 2 MG/ML IJ SOLN: 1.0000 mg | INTRAMUSCULAR | Status: DC | PRN

## 2011-11-02 MED ORDER — ASPIRIN EC 81 MG PO TBEC: 81.0000 mg | DELAYED_RELEASE_TABLET | Freq: Every day | ORAL | Status: AC

## 2011-11-02 MED ORDER — SODIUM CHLORIDE 0.9 % IJ SOLN: 3.0000 mL | INTRAMUSCULAR | Status: DC | PRN

## 2011-11-02 MED ORDER — PANTOPRAZOLE SODIUM 40 MG PO TBEC
40.0000 mg | DELAYED_RELEASE_TABLET | Freq: Every day | ORAL | Status: DC
  Administered 2011-11-02: 40 mg via ORAL
  Filled 2011-11-02: qty 1

## 2011-11-02 MED ORDER — FLUCONAZOLE 100 MG PO TABS
100.0000 mg | ORAL_TABLET | Freq: Every day | ORAL | Status: DC
  Administered 2011-11-02: 100 mg via ORAL
  Filled 2011-11-02: qty 1

## 2011-11-02 MED ORDER — TRAZODONE HCL 50 MG PO TABS
50.0000 mg | ORAL_TABLET | Freq: Every day | ORAL | Status: DC
  Filled 2011-11-02: qty 1

## 2011-11-02 MED ORDER — METOPROLOL TARTRATE 25 MG PO TABS
25.0000 mg | ORAL_TABLET | Freq: Two times a day (BID) | ORAL | Status: DC
  Administered 2011-11-02: 25 mg via ORAL
  Filled 2011-11-02 (×2): qty 1

## 2011-11-02 MED ORDER — ACETAMINOPHEN 650 MG RE SUPP: 650.0000 mg | Freq: Four times a day (QID) | RECTAL | Status: DC | PRN

## 2011-11-02 MED ORDER — ACETAMINOPHEN 325 MG PO TABS: 650.0000 mg | ORAL_TABLET | Freq: Four times a day (QID) | ORAL | Status: DC | PRN

## 2011-11-02 MED ORDER — SIMVASTATIN 20 MG PO TABS
20.0000 mg | ORAL_TABLET | Freq: Every day | ORAL | Status: DC
  Filled 2011-11-02: qty 1

## 2011-11-02 MED ORDER — ALUM & MAG HYDROXIDE-SIMETH 200-200-20 MG/5ML PO SUSP: 30.0000 mL | Freq: Four times a day (QID) | ORAL | Status: DC | PRN

## 2011-11-02 NOTE — Consult Note (Signed)
Reason for Consult: CP Referring Physician: [name] is an 78 y.o. female.  HPI:   The patient is a 78 year old African American female with a history of hypertension, coronary artery disease, hyperlipidemia, drastic and lumbar surgeries, gastroesophageal reflux disease, arthritis and numbness and burning sensation in her upper thighs. She recently underwent coronary bypass grafting x4 on 10/08/2011. Left internal mammary artery to left anterior descending SVG to the diagonal branch of the LAD and SVG to the second obtuse marginal saphenous graft to right coronary artery.    Patient presented with chest pain the upper left region which began at approximately at 0730 hours yesterday. It was associated with nausea, a tight feeling in her throat and shortness of breath.  She was extremely tender to palpation. Patient reports that every time she took a deep breath she would catch it due to the pain. The pain was constant. It was 10 out of 10 in intensity. It was worse with movement.  She denies vision change, cough, congestion, sore throat, abdominal pain, dysuria, hematuria, lower extremity edema.  Past Medical History  Diagnosis Date  . Hypertension     ireg. heartbeat, no referral made to cardiologist , sees [facility].   . Shortness of breath   . Blood transfusion     postop- back surg. 1987  . GERD (gastroesophageal reflux disease)   . Arthritis     degeneration - spondylosis, OA - all over body,   . Neuromuscular disorder     burning- L leg , numbing of toes   . Anxiety     2005- related to care of aging parent   . Chest pain at rest associated with dyspnea 10/05/2011  . Abnormal nuclear stress test 10/05/2011  . CAD (coronary artery disease)significant diseas with 99% LAD, 80% 1st diag, 80% LCX 10/05/2011  . S/P CABG x 4, LIMA-LAD, SVG-OM2, SVG-diag, SVG-RCA secondary to symptomatic CAD and positive stress test 10/05/2011    Past Surgical History  Procedure Date  . Back surgery      1987-   . Eye surgery     cataracts removed- bilateral, no IOL  . Abdominal hysterectomy     partial hysterectomy- 1972  . Tubal ligation   . Toe surgery     R foot- removed calus between 5-4  . Lumbar laminectomy/decompression microdiscectomy 03/04/2011    Procedure: LUMBAR LAMINECTOMY/DECOMPRESSION MICRODISCECTOMY;  Surgeon: Hewitt Shorts, MD;  Location: [facility];  Service: Neurosurgery;  Laterality: N/A;  Thoracic Ten-Thoracic Twelve Thoracic Laminectomy  . Coronary artery bypass graft 10/04/2011    Procedure: CORONARY ARTERY BYPASS GRAFTING (CABG);  Surgeon: Alleen Borne, MD;  Location[facility];  Service: Open Heart Surgery;  Laterality: N/A;  CABG x four using left internal mammary artery and right leg greater saphenous vein     Family History  Problem Relation Age of Onset  . Anesthesia problems Neg Hx   . Hypotension Neg Hx   . Malignant hyperthermia Neg Hx   . Pseudochol deficiency Neg Hx     Social History:  reports that she has never smoked. She does not have any smokeless tobacco history on file. She reports that she does not drink alcohol or use illicit drugs.  Allergies:  Allergies  Allergen Reactions  . Lactose Intolerance (Gi)     Medications:    . amLODipine  5 mg Oral Daily  . aspirin  325 mg Oral Daily  . brimonidine  1 drop Both Eyes BID  .  cycloSPORINE  1 drop Both Eyes BID  . ibuprofen  600 mg Oral TID WC  . ketorolac  30 mg Intravenous Once  . latanoprost  1 drop Both Eyes QHS  . metoprolol  5 mg Intravenous Once  . metoprolol tartrate  25 mg Oral BID  . pantoprazole  40 mg Oral Daily  . simvastatin  20 mg Oral q1800  . sodium chloride  3 mL Intravenous Q12H  . traZODone  50 mg Oral QHS     Results for orders placed during the hospital encounter of 11/01/11 (from the past 48 hour(s))  CBC WITH DIFFERENTIAL     Status: Normal   Collection Time   11/01/11 10:41 PM      Component Value Range Comment   WBC 8.8  4.0 - 10.5 K/uL    RBC  4.11  3.87 - 5.11 MIL/uL    Hemoglobin 12.7  12.0 - 15.0 g/dL    HCT 40.9  81.1 - 91.4 %    MCV 90.8  78.0 - 100.0 fL    MCH 30.9  26.0 - 34.0 pg    MCHC 34.0  30.0 - 36.0 g/dL    RDW 78.2  95.6 - 21.3 %    Platelets 325  150 - 400 K/uL    Neutrophils Relative 63  43 - 77 %    Neutro Abs 5.6  1.7 - 7.7 K/uL    Lymphocytes Relative 23  12 - 46 %    Lymphs Abs 2.0  0.7 - 4.0 K/uL    Monocytes Relative 11  3 - 12 %    Monocytes Absolute 0.9  0.1 - 1.0 K/uL    Eosinophils Relative 3  0 - 5 %    Eosinophils Absolute 0.2  0.0 - 0.7 K/uL    Basophils Relative 0  0 - 1 %    Basophils Absolute 0.0  0.0 - 0.1 K/uL   COMPREHENSIVE METABOLIC PANEL     Status: Abnormal   Collection Time   11/01/11 10:41 PM      Component Value Range Comment   Sodium 134 (*) 135 - 145 mEq/L    Potassium 4.4  3.5 - 5.1 mEq/L    Chloride 96  96 - 112 mEq/L    CO2 28  19 - 32 mEq/L    Glucose, Bld 97  70 - 99 mg/dL    BUN 7  6 - 23 mg/dL    Creatinine, Ser 0.86  0.50 - 1.10 mg/dL    Calcium 57.8  8.4 - 10.5 mg/dL    Total Protein 7.5  6.0 - 8.3 g/dL    Albumin 3.8  3.5 - 5.2 g/dL    AST 22  0 - 37 U/L    ALT 16  0 - 35 U/L    Alkaline Phosphatase 109  39 - 117 U/L    Total Bilirubin 0.4  0.3 - 1.2 mg/dL    GFR calc non Af Amer 87 (*) >90 mL/min    GFR calc Af Amer >90  >90 mL/min   POCT I-STAT TROPONIN I     Status: Normal   Collection Time   11/01/11 10:57 PM      Component Value Range Comment   Troponin i, poc 0.03  0.00 - 0.08 ng/mL    Comment 3            POCT I-STAT, CHEM 8     Status: Abnormal   Collection Time   11/01/11  10:58 PM      Component Value Range Comment   Sodium 135  135 - 145 mEq/L    Potassium 4.5  3.5 - 5.1 mEq/L    Chloride 100  96 - 112 mEq/L    BUN 6  6 - 23 mg/dL    Creatinine, Ser 1.61  0.50 - 1.10 mg/dL    Glucose, Bld 096 (*) 70 - 99 mg/dL    Calcium, Ion 0.45  4.09 - 1.30 mmol/L    TCO2 26  0 - 100 mmol/L    Hemoglobin 13.3  12.0 - 15.0 g/dL    HCT 81.1  91.4 - 78.2 %    BASIC METABOLIC PANEL     Status: Abnormal   Collection Time   11/02/11  7:00 AM      Component Value Range Comment   Sodium 137  135 - 145 mEq/L    Potassium 3.6  3.5 - 5.1 mEq/L    Chloride 102  96 - 112 mEq/L    CO2 26  19 - 32 mEq/L    Glucose, Bld 97  70 - 99 mg/dL    BUN 4 (*) 6 - 23 mg/dL    Creatinine, Ser 9.56 (*) 0.50 - 1.10 mg/dL    Calcium 9.0  8.4 - 21.3 mg/dL    GFR calc non Af Amer >90  >90 mL/min    GFR calc Af Amer >90  >90 mL/min   CBC     Status: Abnormal   Collection Time   11/02/11  7:00 AM      Component Value Range Comment   WBC 7.2  4.0 - 10.5 K/uL    RBC 3.71 (*) 3.87 - 5.11 MIL/uL    Hemoglobin 11.2 (*) 12.0 - 15.0 g/dL    HCT 08.6 (*) 57.8 - 46.0 %    MCV 89.2  78.0 - 100.0 fL    MCH 30.2  26.0 - 34.0 pg    MCHC 33.8  30.0 - 36.0 g/dL    RDW 46.9  62.9 - 52.8 %    Platelets 271  150 - 400 K/uL   TROPONIN I     Status: Normal   Collection Time   11/02/11  7:15 AM      Component Value Range Comment   Troponin I <0.30  <0.30 ng/mL   TROPONIN I     Status: Normal   Collection Time   11/02/11 10:09 AM      Component Value Range Comment   Troponin I <0.30  <0.30 ng/mL     Dg Chest 2 View  11/01/2011  *RADIOLOGY REPORT*  Clinical Data: Chest pain.  CHEST - 2 VIEW  Comparison: Plain films of the chest 10/27/2011 and 10/07/2011.  Findings: The patient is status post median sternotomy. Right pleural effusion appears unchanged since the most recent study. Lungs appear clear.  No pneumothorax or pleural fluid.  IMPRESSION: No change in right pleural effusion.  No new abnormality   Original Report Authenticated By: Bernadene Bell. Maricela Curet, M.D.     Review of Systems  Constitutional: Negative for fever and diaphoresis.       She felt 'Hot"  HENT: Negative for congestion and sore throat.   Eyes: Negative for blurred vision and double vision.  Respiratory: Positive for shortness of breath. Negative for cough.   Cardiovascular: Positive for chest pain and  palpitations. Negative for orthopnea, leg swelling and PND.  Gastrointestinal: Positive for nausea and constipation. Negative for vomiting, abdominal  pain, diarrhea and blood in stool.  Genitourinary: Negative for dysuria and hematuria.  Musculoskeletal: Positive for myalgias.  Neurological: Negative for dizziness.   Blood pressure 136/90, pulse 106, temperature 98.4 F (36.9 C), temperature source Oral, resp. rate 20, height 4\' 11"  (1.499 m), weight 47.446 kg (104 lb 9.6 oz), SpO2 94.00%. Physical Exam  Constitutional: She is oriented to person, place, and time. She appears well-developed and well-nourished. No distress.  HENT:  Head: Normocephalic and atraumatic.  Eyes: EOM are normal. Pupils are equal, round, and reactive to light. No scleral icterus.  Neck: Normal range of motion. No JVD present.  Cardiovascular: Regular rhythm, S1 normal and S2 normal.  Tachycardia present.   No murmur heard. Pulses:      Radial pulses are 2+ on the right side, and 2+ on the left side.       Dorsalis pedis pulses are 2+ on the right side, and 2+ on the left side.       No Carotid bruits  Respiratory: Effort normal and breath sounds normal. She has no wheezes. She has no rales.  GI: Soft. Bowel sounds are normal. There is no tenderness.  Musculoskeletal: She exhibits no edema.  Neurological: She is alert and oriented to person, place, and time. She exhibits normal muscle tone.  Skin: Skin is warm and dry.  Psychiatric: She has a normal mood and affect.    Assessment/Plan: Patient Active Hospital Problem List: CAD with 99% LAD, 80% 1st diag, 80% LCX, NL LVF (10/05/2011) S/P CABG x 4, LIMA-LAD, SVG-OM2, SVG-diag, SVG-RCA secondary to symptomatic CAD and positive stress test 10/04/11 (10/05/2011) Neuropathy, lower extremities (10/07/2011) HTN (hypertension) (10/07/2011) Dyslipidemia, statin added (10/07/2011) Chest pain (11/02/2011)  Plan:  Chest pain is musculoskeletal in nature and has improved  dramatically with vicodin.  The patient is currently resting quite comfortably. Troponin has been negative. She has no acute EKG changes suggesting ischemia.  Given her cardiac history will send her home with a when necessary nitroglycerin.  Her HR is elevated ~100.   Theresa Crosby 11/02/2011, 11:54 AM

## 2011-11-02 NOTE — Progress Notes (Signed)
Patient d/c home with family. D/C instructions and medications given and reviewed. Questions answered. Patient verbalized understanding.

## 2011-11-02 NOTE — Progress Notes (Addendum)
[facility] PROGRESS NOTE  Bertrice Leder NFA:[id number] DOB: 07/13/1932 DOA: 11/01/2011 PCP: Georgianne Fick, MD  Assessment/Plan: Active Problems:  CAD with 99% LAD, 80% 1st diag, 80% LCX, NL LVF  S/P CABG x 4, LIMA-LAD, SVG-OM2, SVG-diag, SVG-RCA secondary to symptomatic CAD and positive stress test 10/04/11  Neuropathy, lower extremities  HTN (hypertension)  Dyslipidemia, statin added  Chest pain    1. Chest pain: Patient presented with a 24 hour history of precordial chest pain, exacerbated by movement and deep inspiration, following an antecedent URI approximately 2 weeks ago. This is atypical for coronary pain, and is associated with localized chest wall tenderness to palpation. Patient also complains of mild SOB, but this may be due to inability to take deep breaths, secondary to pain. CXR is unremarkable for acute pathology. Differential diagnostic considerations include costochondritis/Tietze's syndrome, complicating CABG, pleurisy, following recent URI, or indeed, Dressler's syndrome, following CABG. 2D echocardiogram has been done, and report is pending. Managing with NSAIDs. 12-lead EKG shows SR, no acute ischemic changes, and no changes to suggest pericardial effusion or pericarditis. Patient already feels better today. Have informed [facility] of hospitalization.  2. CAD/status post CABG: Patient underwent CABG on 10/05/11, for significant CAD with 99% LAD, 80% 1st diag, 80% LCX. As described above, her symptoms are not consistent with angina. Clinically, she has no evidence of CHF. Telemetric monitoring continues and we are cycling cardiac enzymes, and they are negative so far. .   3. HTN (hypertension): Controlled on pre-admission antihypertensives.  4. Dyslipidemia: On statin treatment.  5. Neuropathy, lower extremities:   Patient has a history of lumbar spinal stenosis, and has radiculopathy from this. Not problematic/Stable. She follows up with Dr Newell Coral, and has declined  surgery in the past.  6. Oral thrush: This was an incidental finding on physical exam Placed on a 7-day course of Diflucan.    Code Status: Full Code.  Family Communication:  Disposition Plan: To be determined.    Brief narrative: This is a 78 year old female with history of HTN, OA, s/p lumbar laminectomy, anxiety, GERD, dyslipidemia, peripheral neuropathy, CAD, s/p  CABG 10/05/11, who on 11/01/11, woke up with generalized chest pain, distributed across the precordium, and exacerbated slight pain or deep breathing, pain continued all day, and associated with mild SOB. Patient eventually came to the ED. She had an upper respiratory infection with lots of coughing episodes, which has since resolved approximately 2-3 weeks ago. She also reports difficulty getting out of bed using the muscles in the upper body.    Consultants:  N/A.  Procedures:  CXR.  Antibiotics:  N/A.   HPI/Subjective: Chest pain is much improved.   Objective: Vital signs in last 24 hours: Temp:  [98.4 F (36.9 C)-99 F (37.2 C)] 98.4 F (36.9 C) (09/16 0425) Pulse Rate:  [102-121] 106  (09/16 0425) Resp:  [16-26] 20  (09/16 0425) BP: (123-154)/(77-95) 136/90 mmHg (09/16 0425) SpO2:  [94 %-99 %] 94 % (09/16 0425) Weight:  [47.446 kg (104 lb 9.6 oz)] 47.446 kg (104 lb 9.6 oz) (09/16 0425) Weight change:  Last BM Date: 10/29/11  Intake/Output from previous day:       Physical Exam: General: Comfortable, alert, communicative, fully oriented, not short of breath at rest. A bit anxious.   HEENT:  Mild clinical pallor, no jaundice, no conjunctival injection or discharge. Has oral thrush.  NECK:  Supple, JVP not seen, no carotid bruits, no palpable lymphadenopathy, no palpable goiter. CHEST:  Healed median sternotomy scar is noted. Patient has  localized chest wall tenderness to palpation of 3rd-4th left costochondral area, clearly reproducing her symptoms. Clinically clear to auscultation, no wheezes, no  crackles. HEART:  Sounds 1 and 2 heard, normal, regular, no murmurs, mildly tachycardic. ABDOMEN:  Full, soft, non-tender, no palpable organomegaly, no palpable masses, normal bowel sounds. GENITALIA:  Not examined. LOWER EXTREMITIES:  No pitting edema, palpable peripheral pulses. MUSCULOSKELETAL SYSTEM:  Generalized osteoarthritic changes, otherwise, normal. CENTRAL NERVOUS SYSTEM:  No focal neurologic deficit on gross examination.  Lab Results:  Basename 11/01/11 2258 11/01/11 2241  WBC -- 8.8  HGB 13.3 12.7  HCT 39.0 37.3  PLT -- 325    Basename 11/01/11 2258 11/01/11 2241  NA 135 134*  K 4.5 4.4  CL 100 96  CO2 -- 28  GLUCOSE 102* 97  BUN 6 7  CREATININE 0.80 0.58  CALCIUM -- 10.2   No results found for this or any previous visit (from the past 240 hour(s)).   Studies/Results: Dg Chest 2 View  11/01/2011  *RADIOLOGY REPORT*  Clinical Data: Chest pain.  CHEST - 2 VIEW  Comparison: Plain films of the chest 10/27/2011 and 10/07/2011.  Findings: The patient is status post median sternotomy. Right pleural effusion appears unchanged since the most recent study. Lungs appear clear.  No pneumothorax or pleural fluid.  IMPRESSION: No change in right pleural effusion.  No new abnormality   Original Report Authenticated By: Bernadene Bell. Maricela Curet, M.D.     Medications: Scheduled Meds:   . amLODipine  5 mg Oral Daily  . aspirin  325 mg Oral Daily  . brimonidine  1 drop Both Eyes BID  . cycloSPORINE  1 drop Both Eyes BID  . ketorolac  30 mg Intravenous Once  . latanoprost  1 drop Both Eyes QHS  . metoprolol  5 mg Intravenous Once  . metoprolol tartrate  25 mg Oral BID  . pantoprazole  40 mg Oral Daily  . simvastatin  20 mg Oral q1800  . sodium chloride  3 mL Intravenous Q12H  . traZODone  50 mg Oral QHS   Continuous Infusions:   . DISCONTD: sodium chloride 125 mL/hr at 11/02/11 0043   PRN Meds:.sodium chloride, acetaminophen, acetaminophen, albuterol, alum & mag  hydroxide-simeth, HYDROcodone-acetaminophen, ipratropium, ketorolac, morphine injection, nitroGLYCERIN, ondansetron (ZOFRAN) IV, ondansetron, senna-docusate, sodium chloride, zolpidem    LOS: 1 day   Ruble Pumphrey,CHRISTOPHER  [facility] Pager [phone]. If 8PM-8AM, please contact night-coverage at [email or web address], password [facility] 11/02/2011, 7:27 AM  LOS: 1 day

## 2011-11-02 NOTE — Care Management Note (Unsigned)
    Page 1 of 1   11/02/2011     11:29:41 AM   CARE MANAGEMENT NOTE 11/02/2011  Patient:  Theresa Crosby, Theresa Crosby   Account Number:  [id number]  Date Initiated:  11/02/2011  Documentation initiated by:  SIMMONS,Marianela Mandrell  Subjective/Objective Assessment:   ADMITTED WITH CP; LIVES AT HOME ALONE, HAS FRIEND- GUS; USES RW FOR AMBULATION.     Action/Plan:   DISCHARGE PLANNING DISCUSSED AT BEDSIDE.   Anticipated DC Date:  11/03/2011   Anticipated DC Plan:  HOME/SELF CARE      DC Planning Services  CM consult      Choice offered to / List presented to:             Status of service:  In process, will continue to follow Medicare Important Message given?   (If response is "NO", the following Medicare IM given date fields will be blank) Date Medicare IM given:   Date Additional Medicare IM given:    Discharge Disposition:    Per UR Regulation:  Reviewed for med. necessity/level of care/duration of stay  If discussed at Long Length of Stay Meetings, dates discussed:    Comments:  11/02/11  1128  Sihaam Chrobak SIMMONS RN, BSN [phone] NCM WILL FOLLOW.

## 2011-11-02 NOTE — Progress Notes (Signed)
  Echocardiogram 2D Echocardiogram has been performed.  Cathie Beams 11/02/2011, 9:39 AM

## 2011-11-02 NOTE — Consult Note (Signed)
Pt. Seen and examined. Agree with the NP/PA-C note as written. 78 yo female with recent 4 vessel CABG on 10/08/11, now presents with left upper chest pleuritic chest wall pain which is relieved with vicodin. No acute EKG changes were noted. Troponin is negative x 2. Exam is notable for tenderness to palpation over the left chest area which was worse with movement.  This seems like a neuropathic pain, she is hypesthetic. I would recommend a neuropathic pain medication such as gabapentin if considering something for her to take at home.  No further cardiac work-up is necessary.  Thanks for the consult.   Chrystie Nose, MD, [facility] Attending Cardiologist [facility]

## 2011-11-02 NOTE — H&P (Addendum)
PCP:   Georgianne Fick, MD   Chief Complaint:  Chest pain  HPI: This is a 78 year old female who had a CABG in August. Today she woke up with severe chest pains. She took toradol it did not help. The pain continued all day. The patient's chest pain is generalized all across the precordium. She has an significant pain with slight movement or deep breathing. Patient had a  upper respiratory infection with lots of coughing episodes that has since resolved approximately 2-3 weeks ago. She also reports difficulty getting out of bed using the muscles in the upper body. She's now trying to go from sleeping on her back to sleeping on her side. She reports some increasing shortness of breath. History provided by the patient.   Review of Systems:  The patient denies anorexia, fever, weight loss,, vision loss, decreased hearing, hoarseness,  syncope, dyspnea on exertion, peripheral edema, balance deficits, hemoptysis, abdominal pain, melena, hematochezia, severe indigestion/heartburn, hematuria, incontinence, genital sores, muscle weakness, suspicious skin lesions, transient blindness, difficulty walking, depression, unusual weight change, abnormal bleeding, enlarged lymph nodes, angioedema, and breast masses.  Past Medical History: Past Medical History  Diagnosis Date  . Hypertension     ireg. heartbeat, no referral made to cardiologist , sees [facility].   . Shortness of breath   . Blood transfusion     postop- back surg. 1987  . GERD (gastroesophageal reflux disease)   . Arthritis     degeneration - spondylosis, OA - all over body,   . Neuromuscular disorder     burning- L leg , numbing of toes   . Anxiety     2005- related to care of aging parent   . Chest pain at rest associated with dyspnea 10/05/2011  . Abnormal nuclear stress test 10/05/2011  . CAD (coronary artery disease)significant diseas with 99% LAD, 80% 1st diag, 80% LCX 10/05/2011  . S/P CABG x 4, LIMA-LAD, SVG-OM2, SVG-diag, SVG-RCA  secondary to symptomatic CAD and positive stress test 10/05/2011   Past Surgical History  Procedure Date  . Back surgery     1987-   . Eye surgery     cataracts removed- bilateral, no IOL  . Abdominal hysterectomy     partial hysterectomy- 1972  . Tubal ligation   . Toe surgery     R foot- removed calus between 5-4  . Lumbar laminectomy/decompression microdiscectomy 03/04/2011    Procedure: LUMBAR LAMINECTOMY/DECOMPRESSION MICRODISCECTOMY;  Surgeon: Hewitt Shorts, MD;  Location: [facility];  Service: Neurosurgery;  Laterality: N/A;  Thoracic Ten-Thoracic Twelve Thoracic Laminectomy  . Coronary artery bypass graft 10/04/2011    Procedure: CORONARY ARTERY BYPASS GRAFTING (CABG);  Surgeon: Alleen Borne, MD;  Location: [facility] OR;  Service: Open Heart Surgery;  Laterality: N/A;  CABG x four using left internal mammary artery and right leg greater saphenous vein     Medications: Prior to Admission medications   Medication Sig Start Date End Date Taking? Authorizing Provider  amLODipine (NORVASC) 5 MG tablet Take 5 mg by mouth daily.     Yes Historical Provider, MD  brimonidine (ALPHAGAN P) 0.1 % SOLN Place 1 drop into both eyes 2 (two) times daily.     Yes Historical Provider, MD  Calcium-Vitamin D-Vitamin K 500-100-40 MG-UNT-MCG CHEW Chew 1 tablet by mouth 2 (two) times daily.     Yes Historical Provider, MD  Cholecalciferol (VITAMIN D3) 1000 UNITS CAPS Take 2,000 Units by mouth every morning.     Yes Historical Provider, MD  conjugated estrogens (PREMARIN) vaginal cream Place 0.5 g vaginally 2 (two) times a week. Thursday & sunday    Yes Historical Provider, MD  cycloSPORINE (RESTASIS) 0.05 % ophthalmic emulsion Place 1 drop into both eyes 2 (two) times daily.   Yes Historical Provider, MD  estradiol (VIVELLE-DOT) 0.0375 MG/24HR Place 1 patch onto the skin 2 (two) times a week. Thursday & Sunday    Yes Historical Provider, MD  latanoprost (XALATAN) 0.005 % ophthalmic solution Place 1 drop  into both eyes at bedtime.     Yes Historical Provider, MD  metoprolol tartrate (LOPRESSOR) 25 MG tablet Take 1 tablet (25 mg total) by mouth 2 (two) times daily. 10/08/11 10/07/12 Yes Erin R Barrett, PA  Omeprazole (PRILOSEC PO) Take 1 capsule by mouth daily.     Yes Historical Provider, MD  pantoprazole (PROTONIX) 40 MG tablet Take 40 mg by mouth daily.   Yes Historical Provider, MD  simvastatin (ZOCOR) 20 MG tablet Take 1 tablet (20 mg total) by mouth daily at 6 PM. 10/07/11 10/06/12 Yes Donielle Margaretann Loveless, PA  traMADol (ULTRAM) 50 MG tablet Take 50 mg by mouth every 6 (six) hours as needed.   Yes Historical Provider, MD  traZODone (DESYREL) 50 MG tablet Take 50 mg by mouth at bedtime. 1/2 tablet at night   Yes Historical Provider, MD    Allergies:   Allergies  Allergen Reactions  . Lactose Intolerance (Gi)     Social History:  reports that she has never smoked. She does not have any smokeless tobacco history on file. She reports that she does not drink alcohol or use illicit drugs.  Family History: Family History  Problem Relation Age of Onset  . Anesthesia problems Neg Hx   . Hypotension Neg Hx   . Malignant hyperthermia Neg Hx   . Pseudochol deficiency Neg Hx     Physical Exam: Filed Vitals:   11/01/11 2234 11/02/11 0043 11/02/11 0100  BP: 123/77 147/84 138/85  Pulse: 102  116  Temp: 98.5 F (36.9 C) 99 F (37.2 C)   TempSrc: Oral Oral   Resp: 20 24 26   SpO2: 96% 98% 98%    General:  Alert and oriented times three, well developed and nourished, weak-appearing female Eyes: PERRLA, pink conjunctiva, no scleral icterus ENT: Moist oral mucosa, neck supple, no thyromegaly Lungs: clear to ascultation, no wheeze, no crackles, no use of accessory muscles Cardiovascular: regular rate and rhythm, no regurgitation, no gallops, no murmurs. No carotid bruits, no JVD Abdomen: soft, positive BS, non-tender, non-distended, no organomegaly, not an acute abdomen GU: not  examined Neuro: CN II - XII grossly intact, sensation intact Musculoskeletal: strength 5/5 all extremities, no clubbing, cyanosis or edema, very tender chest wall pain bilateral chest with light palpation Skin: no rash, no subcutaneous crepitation, no decubitus Psych: appropriate patient   Labs on Admission:   [facility] 11/01/11 2258 11/01/11 2241  NA 135 134*  K 4.5 4.4  CL 100 96  CO2 -- 28  GLUCOSE 102* 97  BUN 6 7  CREATININE 0.80 0.58  CALCIUM -- 10.2  MG -- --  PHOS -- --    Basename 11/01/11 2241  AST 22  ALT 16  ALKPHOS 109  BILITOT 0.4  PROT 7.5  ALBUMIN 3.8   No results found for this basename: LIPASE:2,AMYLASE:2 in the last 72 hours  Basename 11/01/11 2258 11/01/11 2241  WBC -- 8.8  NEUTROABS -- 5.6  HGB 13.3 12.7  HCT 39.0 37.3  MCV --  90.8  PLT -- 325   No results found for this basename: CKTOTAL:3,CKMB:3,CKMBINDEX:3,TROPONINI:3 in the last 72 hours No components found with this basename: POCBNP:3 No results found for this basename: DDIMER:2 in the last 72 hours No results found for this basename: HGBA1C:2 in the last 72 hours No results found for this basename: CHOL:2,HDL:2,LDLCALC:2,TRIG:2,CHOLHDL:2,LDLDIRECT:2 in the last 72 hours No results found for this basename: TSH,T4TOTAL,FREET3,T3FREE,THYROIDAB in the last 72 hours No results found for this basename: VITAMINB12:2,FOLATE:2,FERRITIN:2,TIBC:2,IRON:2,RETICCTPCT:2 in the last 72 hours  Micro Results: No results found for this or any previous visit (from the past 240 hour(s)).   Radiological Exams on Admission: Dg Chest 2 View  11/01/2011  *RADIOLOGY REPORT*  Clinical Data: Chest pain.  CHEST - 2 VIEW  Comparison: Plain films of the chest 10/27/2011 and 10/07/2011.  Findings: The patient is status post median sternotomy. Right pleural effusion appears unchanged since the most recent study. Lungs appear clear.  No pneumothorax or pleural fluid.  IMPRESSION: No change in right pleural effusion.  No  new abnormality   Original Report Authenticated By: Bernadene Bell. D'ALESSIO, M.D.     EKG: NSR  Assessment/Plan Present on Admission:  .Chest pain . status post recent CABG 23 hour observation on telemetry Likely costochondritis Toradol given but we'll also cycle cardiac enzymes Nitroglycerin when necessary chest pains and pain management   2-D echo to evaluate for pericardial fluid as patient does report some shortness of breath  .HTN (hypertension) .Dyslipidemia, statin added .Neuropathy, lower extremities Stable resume home medications   Full code DVT prophylaxis  Aleeha Boline 11/02/2011, 2:02 AM

## 2011-11-02 NOTE — Discharge Summary (Signed)
Physician Discharge Summary  Theresa Crosby ZOX:[id number] DOB: Jul 01, 1932 DOA: 11/01/2011  PCP: Georgianne Fick, MD  Admit date: 11/01/2011 Discharge date: 11/02/2011  Recommendations for Outpatient Follow-up:  1. Follow up with primary MD. 2. Follow up with primary cardiologist. 3. Follow up with primary cardiothoracic surgeon.   Discharge Diagnoses:  Active Problems:  CAD with 99% LAD, 80% 1st diag, 80% LCX, NL LVF  S/P CABG x 4, LIMA-LAD, SVG-OM2, SVG-diag, SVG-RCA secondary to symptomatic CAD and positive stress test 10/04/11  Neuropathy, lower extremities  HTN (hypertension)  Dyslipidemia, statin added  Chest pain  Oral thrush   Discharge Condition:  Satisfactory.  Diet recommendation: Heart-Healthy.   Filed Weights   11/02/11 0425  Weight: 47.446 kg (104 lb 9.6 oz)    History of present illness:  This is a 78 year old female with history of HTN, OA, s/p lumbar laminectomy, anxiety, GERD, dyslipidemia, peripheral neuropathy, CAD, s/p CABG 10/05/11, who on 11/01/11, woke up with generalized chest pain, distributed across the precordium, and exacerbated slight pain or deep breathing, pain continued all day, and associated with mild SOB. Patient eventually came to the ED. She had an upper respiratory infection with lots of coughing episodes, which has since resolved approximately 2-3 weeks ago. She also reports difficulty getting out of bed using the muscles in the upper body.    Hospital Course:  1. Chest pain: Patient presented with a 24 hour history of precordial chest pain, exacerbated by movement and deep inspiration, following an antecedent URI approximately 2 weeks ago. This is atypical for coronary pain, and is associated with localized chest wall tenderness to palpation. Patient also complained of mild SOB, but this may be due to inability to take deep breaths, secondary to pain. CXR was unremarkable for acute pathology. Differential diagnostic considerations included  costochondritis/Tietze's syndrome complicating CABG, pleurisy following recent URI, or indeed, Dressler's syndrome following CABG. 2D echocardiogram showed normal left ventricular cavity size, mild LVH and no regional wall motion abnormalities. There was no pericardial effusion. 12-lead EKG showed SR, no acute ischemic changes, and no changes to suggest pericardial effusion or pericarditis. Patient was managed with NSAIDs, with speedy improvement in symptoms. Cardiology consultation was provided by [facility], and patient was cleared for discharge on prn NTG.  2. CAD/status post CABG:  Patient underwent CABG on 10/05/11, for significant CAD with 99% LAD, 80% 1st diag, 80% LCX. As described above, her symptoms are not consistent with angina. Clinically, she has no evidence of CHF. Telemetric monitoring revealed no arrythmia and cardiac enzymes were negative.  3. HTN (hypertension): Controlled on pre-admission antihypertensives.  4. Dyslipidemia: On statin treatment.  5. Neuropathy, lower extremities:  Patient has a history of lumbar spinal stenosis, and has radiculopathy from this. Not problematic/Stable. She follows up with Dr Newell Coral, and has declined surgery in the past.  6. Oral thrush: This was an incidental finding on physical exam. Patient has been placed on a 7-day course of Diflucan.    Procedures:  See below.  Consultations:  Cardiology. [facility].   Discharge Exam: Filed Vitals:   11/02/11 0338 11/02/11 0400 11/02/11 0406 11/02/11 0425  BP: 154/89 133/87 133/87 136/90  Pulse:  102  106  Temp:    98.4 F (36.9 C)  TempSrc:    Oral  Resp:  22 16 20   Height:    4\' 11"  (1.499 m)  Weight:    47.446 kg (104 lb 9.6 oz)  SpO2:  97% 99% 94%    General: Comfortable, alert, communicative, fully oriented, not  short of breath at rest. A bit anxious.  HEENT: Mild clinical pallor, no jaundice, no conjunctival injection or discharge. Has oral thrush.  NECK: Supple, JVP not seen, no carotid bruits,  no palpable lymphadenopathy, no palpable goiter.  CHEST: Healed median sternotomy scar is noted. Patient has localized chest wall tenderness to palpation of 3rd-4th left costochondral area, clearly reproducing her symptoms. Clinically clear to auscultation, no wheezes, no crackles.  HEART: Sounds 1 and 2 heard, normal, regular, no murmurs, mildly tachycardic.  ABDOMEN: Full, soft, non-tender, no palpable organomegaly, no palpable masses, normal bowel sounds.  GENITALIA: Not examined.  LOWER EXTREMITIES: No pitting edema, palpable peripheral pulses.  MUSCULOSKELETAL SYSTEM: Generalized osteoarthritic changes, otherwise, normal.  CENTRAL NERVOUS SYSTEM: No focal neurologic deficit on gross examination.  Discharge Instructions      Discharge Orders    Future Orders Please Complete By Expires   Diet - low sodium heart healthy      Increase activity slowly          Medication List     As of 11/02/2011  3:00 PM    STOP taking these medications         PRILOSEC PO      traMADol 50 MG tablet   Commonly known as: ULTRAM      TAKE these medications         amLODipine 5 MG tablet   Commonly known as: NORVASC   Take 5 mg by mouth daily.      aspirin EC 81 MG tablet   Take 1 tablet (81 mg total) by mouth daily.      brimonidine 0.1 % Soln   Commonly known as: ALPHAGAN P   Place 1 drop into both eyes 2 (two) times daily.      Calcium-Vitamin D-Vitamin K 500-100-40 MG-UNT-MCG Chew   Chew 1 tablet by mouth 2 (two) times daily.      conjugated estrogens vaginal cream   Commonly known as: PREMARIN   Place 0.5 g vaginally 2 (two) times a week. Thursday & sunday      cycloSPORINE 0.05 % ophthalmic emulsion   Commonly known as: RESTASIS   Place 1 drop into both eyes 2 (two) times daily.      estradiol 0.0375 MG/24HR   Commonly known as: VIVELLE-DOT   Place 1 patch onto the skin 2 (two) times a week. Thursday & Sunday      fluconazole 100 MG tablet   Commonly known as: DIFLUCAN     Take 1 tablet (100 mg total) by mouth daily.      HYDROcodone-acetaminophen 5-325 MG per tablet   Commonly known as: NORCO/VICODIN   Take 1 tablet by mouth every 4 (four) hours as needed.      ibuprofen 600 MG tablet   Commonly known as: ADVIL,MOTRIN   Take 1 tablet (600 mg total) by mouth 3 (three) times daily with meals.      latanoprost 0.005 % ophthalmic solution   Commonly known as: XALATAN   Place 1 drop into both eyes at bedtime.      metoprolol tartrate 25 MG tablet   Commonly known as: LOPRESSOR   Take 1 tablet (25 mg total) by mouth 2 (two) times daily.      nitroGLYCERIN 0.4 MG SL tablet   Commonly known as: NITROSTAT   Place 1 tablet (0.4 mg total) under the tongue every 5 (five) minutes as needed for chest pain.      pantoprazole 40 MG tablet  Commonly known as: PROTONIX   Take 40 mg by mouth daily.      simvastatin 20 MG tablet   Commonly known as: ZOCOR   Take 1 tablet (20 mg total) by mouth daily at 6 PM.      traZODone 50 MG tablet   Commonly known as: DESYREL   Take 50 mg by mouth at bedtime. 1/2 tablet at night      Vitamin D3 1000 UNITS Caps   Take 2,000 Units by mouth every morning.        Follow-up Information    Follow up with [facility], MD. (As needed)    Contact information:   [address] [place] [address] [phone]       Follow up with Thurmon Fair, MD. (Per prior appointment)    Contact information:   [address] [place] [address] [phone]       Follow up with Alleen Borne, MD. (Per prior appointment.)    Contact information:   [address] [place] [address] [phone]           The results of significant diagnostics from this hospitalization (including imaging, microbiology, ancillary and laboratory) are listed below for reference.    Significant Diagnostic Studies: Dg Chest 2 View  11/01/2011  *RADIOLOGY REPORT*  Clinical Data: Chest  pain.  CHEST - 2 VIEW  Comparison: Plain films of the chest 10/27/2011 and 10/07/2011.  Findings: The patient is status post median sternotomy. Right pleural effusion appears unchanged since the most recent study. Lungs appear clear.  No pneumothorax or pleural fluid.  IMPRESSION: No change in right pleural effusion.  No new abnormality   Original Report Authenticated By: Bernadene Bell. D'ALESSIO, M.D.    Dg Chest 2 View  10/27/2011  *RADIOLOGY REPORT*  Clinical Data: Shortness of breath, chest pressure  CHEST - 2 VIEW  Comparison: Chest x-ray of 10/07/2011  Findings: Aeration of the right lung base has improved with persistent elevation of the right hemidiaphragm.  There may be mild right pleural thickening versus small pleural effusion blunting the posterior costophrenic angle on the right.  Cardiomegaly is stable. Median sternotomy sutures are noted from CABG.  A mild thoracolumbar scoliosis is stable.  IMPRESSION: It improved aeration of the right lung base.  Cannot exclude small right effusion.  Stable cardiomegaly.   Original Report Authenticated By: Juline Patch, M.D.    Dg Chest 2 View  10/07/2011  *RADIOLOGY REPORT*  Clinical Data: CABG, weakness.  CHEST - 2 VIEW  Comparison: 10/05/2011  Findings: Interval removal of bilateral chest tubes and Swan-Ganz catheter.  No pneumothorax.  Increasing right basilar atelectasis and small right effusion.  Mild cardiomegaly.  IMPRESSION: No pneumothorax following bilateral chest tube removal.  Increasing right base atelectasis and right effusion.   Original Report Authenticated By: Cyndie Chime, M.D.    Dg Chest Portable 1 View In Am  10/05/2011  *RADIOLOGY REPORT*  Clinical Data: Follow-up CABG  PORTABLE CHEST - 1 VIEW  Comparison: 08/18  Findings: Endotracheal tube, nasogastric tube have been removed. Mediastinal drain remains in place.  Swan-Ganz catheter has its tip in the main pulmonary artery.  Bilateral chest tubes are in place. No pneumothorax.  Mild  basilar atelectasis bilaterally.  IMPRESSION: Good appearance following endotracheal tube and nasogastric tube removal.  No pneumothorax.  Mild basilar atelectasis.   Original Report Authenticated By: Thomasenia Sales, M.D. ( 10/05/2011 07:51:34 )    Dg Chest Portable  1 View  10/04/2011  *RADIOLOGY REPORT*  Clinical Data: Postop.  PORTABLE CHEST - 1 VIEW  Comparison: 09/24/2011.  Findings: Endotracheal tube terminates approximately 1.9 cm above the estimated location of the carina.  Nasogastric tube is followed into the stomach.  Right IJ Swan-Ganz catheter tip projects over the proximal right pulmonary artery.  Bilateral chest tubes, mediastinal drains and epicardial pacer wires are in place.  Heart size within normal limits given postoperative status and semi upright AP technique.  Lungs are somewhat low in volume with mild interstitial prominence.  No definite pleural fluid.  No definite pneumothorax.  IMPRESSION:  1.  Interval median sternotomy with support apparatus in place. 2.  Low lung volumes with probable vascular crowding rather than pulmonary edema.  Original Report Authenticated By: Reyes Ivan, M.D.    Microbiology: No results found for this or any previous visit (from the past 240 hour(s)).   Labs: Basic Metabolic Panel:  Lab 11/02/11 4401 11/01/11 2258 11/01/11 2241  NA 137 135 134*  K 3.6 4.5 4.4  CL 102 100 96  CO2 26 -- 28  GLUCOSE 97 102* 97  BUN 4* 6 7  CREATININE 0.48* 0.80 0.58  CALCIUM 9.0 -- 10.2  MG -- -- --  PHOS -- -- --   Liver Function Tests:  Lab 11/01/11 2241  AST 22  ALT 16  ALKPHOS 109  BILITOT 0.4  PROT 7.5  ALBUMIN 3.8   No results found for this basename: LIPASE:5,AMYLASE:5 in the last 168 hours No results found for this basename: AMMONIA:5 in the last 168 hours CBC:  Lab 11/02/11 0700 11/01/11 2258 11/01/11 2241  WBC 7.2 -- 8.8  NEUTROABS -- -- 5.6  HGB 11.2* 13.3 12.7  HCT 33.1* 39.0 37.3  MCV 89.2 -- 90.8  PLT 271 -- 325    Cardiac Enzymes:  Lab 11/02/11 1009 11/02/11 0715  CKTOTAL -- --  CKMB -- --  CKMBINDEX -- --  TROPONINI <0.30 <0.30   BNP: BNP (last 3 results) No results found for this basename: PROBNP:3 in the last 8760 hours CBG: No results found for this basename: GLUCAP:5 in the last 168 hours  Time coordinating discharge: 35 minutes  Signed:  Claudett Bayly,CHRISTOPHER  [facility] 11/02/2011, 3:00 PM

## 2012-01-19 ENCOUNTER — Other Ambulatory Visit (HOSPITAL_COMMUNITY): Payer: Self-pay | Admitting: Cardiovascular Disease

## 2012-01-19 DIAGNOSIS — I739 Peripheral vascular disease, unspecified: Secondary | ICD-10-CM

## 2012-02-08 ENCOUNTER — Encounter (HOSPITAL_COMMUNITY): Payer: Medicare Other

## 2012-03-03 ENCOUNTER — Encounter (HOSPITAL_COMMUNITY): Payer: Medicare Other

## 2012-05-11 ENCOUNTER — Encounter: Payer: Self-pay | Admitting: Surgery

## 2012-05-11 ENCOUNTER — Ambulatory Visit
Admission: RE | Admit: 2012-05-11 | Discharge: 2012-05-11 | Disposition: A | Discharge status: Home / Self Care | Payer: Medicare Other | Source: Ambulatory Visit | Attending: Surgery | Admitting: Surgery

## 2012-05-11 ENCOUNTER — Other Ambulatory Visit: Payer: Self-pay | Admitting: *Deleted

## 2012-05-11 ENCOUNTER — Ambulatory Visit (INDEPENDENT_AMBULATORY_CARE_PROVIDER_SITE_OTHER): Payer: Medicare Other | Admitting: Surgery

## 2012-05-11 VITALS — BP 126/83 | HR 73 | Resp 20 | Ht 59.0 in | Wt 104.0 lb

## 2012-05-11 DIAGNOSIS — I2510 Atherosclerotic heart disease of native coronary artery without angina pectoris: Secondary | ICD-10-CM

## 2012-05-11 DIAGNOSIS — R209 Unspecified disturbances of skin sensation: Secondary | ICD-10-CM

## 2012-05-11 DIAGNOSIS — L7682 Other postprocedural complications of skin and subcutaneous tissue: Secondary | ICD-10-CM

## 2012-05-11 DIAGNOSIS — Z951 Presence of aortocoronary bypass graft: Secondary | ICD-10-CM

## 2012-05-11 NOTE — Progress Notes (Signed)
[address]       [place] [address]             [phone]      HPI:  The patient returns today for examination of her chest incision status post coronary bypass graft surgery x4 on 10/04/2011. She had an uncomplicated postoperative course and I last saw her in the office on 10/27/2011 at which time she was doing fairly well. Her incision looked good at that time. She says that over the past several months she has developed progressive thickening of the sternotomy scar and it has become red and very tender. She said that when she made the appointment to see me a couple weeks ago she was having a lot of pain in the incision that has a significantly improved. She said it is always burning and she can't stand to have any clothes or blankets touching it. She denies any fever or chills. She has been breathing well. She just finished cardiac rehabilitation at [facility] on 05/11/2012 and her report from cardiac rehabilitation shows that she has significant improvement in her cardiovascular fitness.  Current Outpatient Prescriptions  Medication Sig Dispense Refill  . amLODipine (NORVASC) 5 MG tablet Take 5 mg by mouth daily.        Marland Kitchen aspirin EC 81 MG tablet Take 1 tablet (81 mg total) by mouth daily.  100 tablet  0  . brimonidine (ALPHAGAN P) 0.1 % SOLN Place 1 drop into both eyes 2 (two) times daily.        . Calcium-Vitamin D-Vitamin K 500-100-40 MG-UNT-MCG CHEW Chew 1 tablet by mouth 2 (two) times daily.        . Cholecalciferol (VITAMIN D3) 1000 UNITS CAPS Take 2,000 Units by mouth every morning.        . conjugated estrogens (PREMARIN) vaginal cream Place 0.5 g vaginally 2 (two) times a week. Thursday & sunday       . cycloSPORINE (RESTASIS) 0.05 % ophthalmic emulsion Place 1 drop into both eyes 2 (two) times daily.      Marland Kitchen estradiol (VIVELLE-DOT) 0.0375 MG/24HR Place 1 patch onto the skin 2 (two) times a week. Thursday & Sunday       . ibuprofen (ADVIL,MOTRIN) 600  MG tablet Take 1 tablet (600 mg total) by mouth 3 (three) times daily with meals.  30 tablet  0  . latanoprost (XALATAN) 0.005 % ophthalmic solution Place 1 drop into both eyes at bedtime.        . metoprolol tartrate (LOPRESSOR) 25 MG tablet Take 1 tablet (25 mg total) by mouth 2 (two) times daily.  60 tablet  1  . nitroGLYCERIN (NITROSTAT) 0.4 MG SL tablet Place 1 tablet (0.4 mg total) under the tongue every 5 (five) minutes as needed for chest pain.  100 tablet  0  . pantoprazole (PROTONIX) 40 MG tablet Take 40 mg by mouth daily.      . simvastatin (ZOCOR) 20 MG tablet Take 1 tablet (20 mg total) by mouth daily at 6 PM.  30 tablet  1   No current facility-administered medications for this visit.     Physical Exam: BP 126/83  Pulse 73  Resp 20  Ht 4\' 11"  (1.499 m)  Wt 104 lb (47.174 kg)  BMI 20.99 kg/m2  SpO2 98% She looks well. Cardiac exam shows regular rate and rhythm with normal heart sounds. Her lung exam is clear. The sternotomy scar is a thick keloid that it  is not particularly wide. There is pinkish color along the entire keloid and it is tender to palpation. There is no drainage and she has not noticed any drainage at home. The sternum feels stable.  Diagnostic Tests:  *RADIOLOGY REPORT*   Clinical Data: Chest pain and tenderness.   CHEST - 2 VIEW   Comparison: 03/24/2012.   Findings: Trachea is midline.  Heart size normal.  Sternotomy wires are unchanged in position.  Volume loss in the right lung base is seen adjacent to an elevated right hemidiaphragm, as before.  Lungs are otherwise clear.  No pleural fluid.   Degenerative changes are seen in the spine.  There appears be mild anterior wedging of a mid thoracic vertebral body, unchanged.   IMPRESSION: No acute findings.     Original Report Authenticated By: Leanna Battles, M.D.     Impression:  She has developed a keloid along the sternotomy scar which is pink and tender. It is not clear to me why it  is this pink and tender this far out from surgery. She has had other surgery and never developed a keloid before. Her incision was closed with subcuticular vicryl and occasionally we see patients have a reaction to it. There is no sign of infection. I recommended that she try some 1% hydrocortisone cream to the incision for the next few weeks to see if it makes an improvement. She has fairly lax skin so we could excise the keloid and reclose the incision with subcuticular removable prolene. She may still get another keloid even with that technique.  Plan:  She will try using 1% hydrocortisone cream to the incision for the next 3-4 weeks and I will see her back in the office in one month to reassess things.

## 2012-05-20 ENCOUNTER — Ambulatory Visit (HOSPITAL_COMMUNITY)
Admission: RE | Admit: 2012-05-20 | Discharge: 2012-05-20 | Disposition: A | Discharge status: Home / Self Care | Payer: Medicare Other | Source: Ambulatory Visit | Attending: Cardiovascular Disease | Admitting: Cardiovascular Disease

## 2012-05-20 ENCOUNTER — Other Ambulatory Visit (HOSPITAL_COMMUNITY): Payer: Self-pay | Admitting: *Deleted

## 2012-05-20 DIAGNOSIS — I739 Peripheral vascular disease, unspecified: Secondary | ICD-10-CM | POA: Insufficient documentation

## 2012-05-20 NOTE — Progress Notes (Signed)
Arterial Duplex Lower Ext. Completed. Zerick Prevette D  

## 2012-05-30 ENCOUNTER — Encounter: Payer: Self-pay | Admitting: Podiatry

## 2012-05-30 ENCOUNTER — Ambulatory Visit (INDEPENDENT_AMBULATORY_CARE_PROVIDER_SITE_OTHER): Payer: Medicare Other | Admitting: Podiatry

## 2012-05-30 VITALS — BP 128/77 | HR 78 | Ht 59.0 in | Wt 104.0 lb

## 2012-05-30 DIAGNOSIS — B351 Tinea unguium: Secondary | ICD-10-CM

## 2012-05-30 DIAGNOSIS — M25579 Pain in unspecified ankle and joints of unspecified foot: Secondary | ICD-10-CM

## 2012-05-30 NOTE — Patient Instructions (Addendum)
Seen for thick elongated toe nails.  Having lower and upper back problem with numbness, tingling on both feet.  All nails are trimmed. Noted no open lesions or acute changes. Noted good circulation. Return in 3 months for routine foot care.

## 2012-05-30 NOTE — Progress Notes (Signed)
History of Present Illness: Still have back problem since came down to [place]. Last upper back surgery was done January 2013. Has hx of lower back surgery in 1980's. Gets physical therapy for pain in tail bone. Open heart surgery 2013. Problem with scar in chest. Still seeing the surgeon for the scar. Last toe surgery was July 2013 on right 5th digit. Has not had any problem since then.  Patient also stated that she has numbness, tingling, and burning sensations on her both feet and coldness at night. She was told that this could be from her lower back problem.  Today patient stated that there is pain on right great toe from thick nails.  Objective: All nails are hypertrophic. No skin lesions noted.  Pedal pulses are all palpable bilateral. Sensory testing with Monofilament and Vibratory testing is within normal. DTR at ankle is within normal. No problems with osseous structures. Assessment: Onychomycosis x 10. 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  Plan: All hypertrophic nails debrided. Return in 3 months or as needed for routine foot care. Positive of numbness, tingling, and burning of feet bil. Cold aching at night. Thought this was from lower back problem. Normal DTR, Vibration, Monofilament.

## 2012-06-13 ENCOUNTER — Encounter: Payer: Self-pay | Admitting: *Deleted

## 2012-06-15 ENCOUNTER — Ambulatory Visit (INDEPENDENT_AMBULATORY_CARE_PROVIDER_SITE_OTHER): Payer: Medicare Other | Admitting: Surgery

## 2012-06-15 ENCOUNTER — Ambulatory Visit: Payer: Medicare Other | Admitting: Surgery

## 2012-06-15 ENCOUNTER — Encounter: Payer: Self-pay | Admitting: Surgery

## 2012-06-15 VITALS — BP 125/80 | HR 85 | Resp 20 | Ht 59.0 in | Wt 104.0 lb

## 2012-06-15 DIAGNOSIS — Z951 Presence of aortocoronary bypass graft: Secondary | ICD-10-CM

## 2012-06-15 DIAGNOSIS — L910 Hypertrophic scar: Secondary | ICD-10-CM

## 2012-06-16 ENCOUNTER — Encounter: Payer: Self-pay | Admitting: Cardiovascular Disease

## 2012-06-18 ENCOUNTER — Encounter: Payer: Self-pay | Admitting: Surgery

## 2012-06-18 NOTE — Progress Notes (Signed)
[address]       [name] [address]             [phone]      HPI:  The patient returns today for examination of her chest incision status post coronary bypass graft surgery x4 on 10/04/2011. She had an uncomplicated postoperative course and I last saw her in the office on 10/27/2011 at which time she was doing fairly well. Her incision looked good at that time. She says that over the past several months she has developed progressive thickening of the sternotomy scar and it has become red and very tender. Around the middle of March it was causing her a lot of pain and she scheduled an appointment to see me on March 26th but by then it felt much better. When I saw her she had a keloid form along the sternotomy scar that was mildly tender, slightly red but well-healed without signs of infection. She wanted to give this a little more time so I agreed to see her back in a month. Over the past month it has not changed and is still tender at times.     Current Outpatient Prescriptions  Medication Sig Dispense Refill  . amLODipine (NORVASC) 5 MG tablet Take 5 mg by mouth daily.        Marland Kitchen aspirin EC 81 MG tablet Take 1 tablet (81 mg total) by mouth daily.  100 tablet  0  . brimonidine (ALPHAGAN P) 0.1 % SOLN Place 1 drop into both eyes 2 (two) times daily.        . Calcium-Vitamin D-Vitamin K 500-100-40 MG-UNT-MCG CHEW Chew 1 tablet by mouth 2 (two) times daily.        . Cholecalciferol (VITAMIN D3) 1000 UNITS CAPS Take 2,000 Units by mouth every morning.        . conjugated estrogens (PREMARIN) vaginal cream Place 0.5 g vaginally 2 (two) times a week. Thursday & sunday       . cycloSPORINE (RESTASIS) 0.05 % ophthalmic emulsion Place 1 drop into both eyes 2 (two) times daily.      Marland Kitchen estradiol (VIVELLE-DOT) 0.0375 MG/24HR Place 1 patch onto the skin 2 (two) times a week. Thursday & Sunday       . fluticasone (FLONASE) 50 MCG/ACT nasal spray Place 2 sprays into the nose as needed  for rhinitis.      . hydrocortisone cream 1 % Apply topically 2 (two) times daily.      Marland Kitchen latanoprost (XALATAN) 0.005 % ophthalmic solution Place 1 drop into both eyes at bedtime.        Marland Kitchen loratadine (CLARITIN) 10 MG tablet Take 10 mg by mouth daily.      . metoprolol tartrate (LOPRESSOR) 25 MG tablet Take 1 tablet (25 mg total) by mouth 2 (two) times daily.  60 tablet  1  . nitroGLYCERIN (NITROSTAT) 0.4 MG SL tablet Place 1 tablet (0.4 mg total) under the tongue every 5 (five) minutes as needed for chest pain.  100 tablet  0  . pantoprazole (PROTONIX) 40 MG tablet Take 40 mg by mouth daily.      . simvastatin (ZOCOR) 20 MG tablet Take 1 tablet (20 mg total) by mouth daily at 6 PM.  30 tablet  1   No current facility-administered medications for this visit.     Physical Exam:  BP 125/80  Pulse 85  Resp 20  Ht 4\' 11"  (1.499 m)  Wt 104 lb (47.174 kg)  BMI 20.99 kg/m2  SpO2 95% She looks well Lung exam is clear Cardiac exam shows a regular rate and rhythm with normal heart sounds. The sternum is stable The sternotomy scar has a keloid that is pinkish along the edges, mildly tender.   Impression:  She has had a keloid form in the sternotomy scar. It is tender and mildly red. I don't know if there is anything that can be done to decrease this now. She said she did try silicone sheeting when it started to form but only did that briefly which is ineffective. Steroid injections can be effective if done early but I don't know if that would help now. She would like to be evaluated by a Engineer, petroleum.  Plan:  I will send her to see Dr. Shon Hough to see if anything can be done for this.

## 2012-08-29 ENCOUNTER — Ambulatory Visit: Payer: Medicare Other | Admitting: Podiatry

## 2012-08-30 ENCOUNTER — Ambulatory Visit (INDEPENDENT_AMBULATORY_CARE_PROVIDER_SITE_OTHER): Payer: Medicare Other | Admitting: Podiatry

## 2012-08-30 ENCOUNTER — Encounter: Payer: Self-pay | Admitting: Podiatry

## 2012-08-30 VITALS — BP 117/74 | HR 72 | Ht 59.0 in | Wt 107.0 lb

## 2012-08-30 DIAGNOSIS — M25579 Pain in unspecified ankle and joints of unspecified foot: Secondary | ICD-10-CM

## 2012-08-30 DIAGNOSIS — B351 Tinea unguium: Secondary | ICD-10-CM

## 2012-08-30 NOTE — Progress Notes (Signed)
Subjective: Still has numbness in toes since 2012. Will see her doctor for her back pain.  Objective:  All nails are hypertrophic. No skin lesions noted.  Pedal pulses are all palpable bilateral.  Sensory testing with Monofilament and Vibratory testing is within normal. DTR at ankle is within normal.  Mild hallux valgus with bunion asymptomatic.   Assessment:  Onychomycosis x 10.  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   Plan:  All hypertrophic nails debrided.  Return in 3 months or as needed for routine foot care.

## 2012-09-14 ENCOUNTER — Ambulatory Visit (INDEPENDENT_AMBULATORY_CARE_PROVIDER_SITE_OTHER): Payer: Medicare Other | Admitting: Cardiovascular Disease

## 2012-09-14 ENCOUNTER — Encounter: Payer: Self-pay | Admitting: Cardiovascular Disease

## 2012-09-14 VITALS — BP 116/62 | HR 78 | Resp 16 | Ht 59.0 in | Wt 106.4 lb

## 2012-09-14 DIAGNOSIS — E785 Hyperlipidemia, unspecified: Secondary | ICD-10-CM

## 2012-09-14 DIAGNOSIS — I2510 Atherosclerotic heart disease of native coronary artery without angina pectoris: Secondary | ICD-10-CM

## 2012-09-14 DIAGNOSIS — Z951 Presence of aortocoronary bypass graft: Secondary | ICD-10-CM

## 2012-09-14 DIAGNOSIS — I10 Essential (primary) hypertension: Secondary | ICD-10-CM

## 2012-09-14 NOTE — Assessment & Plan Note (Signed)
She has a keloid sternotomy scar and is seeing a Engineer, petroleum for this.

## 2012-09-14 NOTE — Progress Notes (Signed)
Patient ID: Theresa Crosby, female   DOB: Dec 14, 1932, 79 y.o.   MRN: [id number]     Reason for office visit CAD status post CABG followup  It has been almost one year since Theresa Crosby underwent multivessel bypass surgery for left main coronary stenosis equivalent disease (high-grade stenoses in the left circumflex and LAD arteries). She has recovered very well from her surgical procedure. She is very active. She goes to the gym 3 days a week. She is lean and is very interested in following a healthy diet. She does not smoke. Her latest lipid profile showed an excellent HDL of around 70 and a well treated LDL of around 70 on simvastatin monotherapy. She has no complaints of chest pain at this time exertion, denies focal neurological deficits or syncope and is not troubled by lower showed edema. She has a lot of discomfort in her keloid scar both at the sternotomy site but also even over the temporary pacemaker lead and chest tube sites. She is seeing a Engineer, petroleum has been performing some steroid injections into the scar. The injections are painful but are beginning to help reduce the sensitivity of the scar. Lower extremity arterial Dopplers of the show any sign of significant obstruction    Allergies  Allergen Reactions  . Aspirin Other (See Comments)    GI upset,high doses  . Lactose Intolerance (Gi)     Current Outpatient Prescriptions  Medication Sig Dispense Refill  . amLODipine (NORVASC) 5 MG tablet Take 5 mg by mouth daily.        Marland Kitchen aspirin EC 81 MG tablet Take 1 tablet (81 mg total) by mouth daily.  100 tablet  0  . brimonidine (ALPHAGAN P) 0.1 % SOLN Place 1 drop into both eyes 2 (two) times daily.        . Calcium-Vitamin D-Vitamin K 500-100-40 MG-UNT-MCG CHEW Chew 1 tablet by mouth 2 (two) times daily.        . Cholecalciferol (VITAMIN D3) 1000 UNITS CAPS Take 2,000 Units by mouth every morning.        . conjugated estrogens (PREMARIN) vaginal cream Place 0.5 g vaginally 2  (two) times a week. Thursday & sunday       . cycloSPORINE (RESTASIS) 0.05 % ophthalmic emulsion Place 1 drop into both eyes 2 (two) times daily.      . fluticasone (FLONASE) 50 MCG/ACT nasal spray Place 2 sprays into the nose as needed for rhinitis.      . hydrocortisone cream 1 % Apply topically 2 (two) times daily.      Marland Kitchen latanoprost (XALATAN) 0.005 % ophthalmic solution Place 1 drop into both eyes at bedtime.        Marland Kitchen loratadine (CLARITIN) 10 MG tablet Take 10 mg by mouth daily.      . metoprolol tartrate (LOPRESSOR) 25 MG tablet Take 12.5 mg by mouth 2 (two) times daily.      . nitroGLYCERIN (NITROSTAT) 0.4 MG SL tablet Place 1 tablet (0.4 mg total) under the tongue every 5 (five) minutes as needed for chest pain.  100 tablet  0  . pantoprazole (PROTONIX) 40 MG tablet Take 40 mg by mouth daily.      . simvastatin (ZOCOR) 20 MG tablet Take 1 tablet (20 mg total) by mouth daily at 6 PM.  30 tablet  1   No current facility-administered medications for this visit.    Past Medical History  Diagnosis Date  . Hypertension     ireg. heartbeat,  no referral made to cardiologist , sees [facility] Med.   . Shortness of breath   . Blood transfusion     postop- back surg. 1987  . GERD (gastroesophageal reflux disease)   . Arthritis     degeneration - spondylosis, OA - all over body,   . Neuromuscular disorder     burning- L leg , numbing of toes   . Anxiety     2005- related to care of aging parent   . Chest pain at rest associated with dyspnea 10/05/2011  . Abnormal nuclear stress test 10/05/2011  . CAD (coronary artery disease)significant diseas with 99% LAD, 80% 1st diag, 80% LCX 10/05/2011  . S/P CABG x 4, LIMA-LAD, SVG-OM2, SVG-diag, SVG-RCA secondary to symptomatic CAD and positive stress test 10/05/2011  . Bunion   . Hyperlipemia     Past Surgical History  Procedure Laterality Date  . Back surgery      1987-   . Eye surgery      cataracts removed- bilateral, no IOL  . Abdominal  hysterectomy      partial hysterectomy- 1972  . Tubal ligation    . Toe surgery      R foot- removed calus between 5-4  . Lumbar laminectomy/decompression microdiscectomy  03/04/2011    Procedure: LUMBAR LAMINECTOMY/DECOMPRESSION MICRODISCECTOMY;  Surgeon: Hewitt Shorts, MD;  Location: [facility];  Service: Neurosurgery;  Laterality: N/A;  Thoracic Ten-Thoracic Twelve Thoracic Laminectomy  . Coronary artery bypass graft  10/04/2011    Procedure: CORONARY ARTERY BYPASS GRAFTING (CABG);  Surgeon: Alleen Borne, MD;  Location: [facility];  Service: Open Heart Surgery;  Laterality: N/A;  CABG x four using left internal mammary artery and right leg greater saphenous vein   . Foot surgery Right January 2013    HT 5  . Nm myoview ltd  09/15/2011    mild ischemia mid anterior & apical anterior region.EF: 86%  . Cardiac catheterization  10/12/2011    high-grade ca+ prox. LAD diagonal branch,higly diseased diagonal branch & obtuse marginal branch    Family History  Problem Relation Age of Onset  . Anesthesia problems Neg Hx   . Hypotension Neg Hx   . Malignant hyperthermia Neg Hx   . Pseudochol deficiency Neg Hx   . Heart attack Mother   . Heart attack Father   . Hypertension Father   . Heart attack Brother     History   Social History  . Marital Status: Widowed    Spouse Name: N/A    Number of Children: N/A  . Years of Education: N/A   Occupational History  . Not on file.   Social History Main Topics  . Smoking status: Never Smoker   . Smokeless tobacco: Never Used  . Alcohol Use: No  . Drug Use: No  . Sexually Active: Not on file   Other Topics Concern  . Not on file   Social History Narrative  . No narrative on file    Review of systems: The patient specifically denies any chest pain at rest or with exertion, dyspnea at rest or with exertion, orthopnea, paroxysmal nocturnal dyspnea, syncope, palpitations, focal neurological deficits, intermittent claudication, lower  extremity edema, unexplained weight gain, cough, hemoptysis or wheezing.  The patient also denies abdominal pain, nausea, vomiting, dysphagia, diarrhea, constipation, polyuria, polydipsia, dysuria, hematuria, frequency, urgency, abnormal bleeding or bruising, fever, chills, unexpected weight changes, mood swings, change in skin or hair texture, change in voice quality, auditory or visual problems,  allergic reactions or rashes, new musculoskeletal complaints other than usual "aches and pains".   PHYSICAL EXAM BP 116/62  Pulse 78  Resp 16  Ht 4\' 11"  (1.499 m)  Wt 106 lb 6.4 oz (48.263 kg)  BMI 21.48 kg/m2  General: Alert, oriented x3, no distress Head: no evidence of trauma, PERRL, EOMI, no exophtalmos or lid lag, no myxedema, no xanthelasma; normal ears, nose and oropharynx Neck: normal jugular venous pulsations and no hepatojugular reflux; brisk carotid pulses without delay and no carotid bruits Chest: clear to auscultation, no signs of consolidation by percussion or palpation, normal fremitus, symmetrical and full respiratory excursions; prominent keloid formation especially at the bottom half of her sternotomy scar Cardiovascular: normal position and quality of the apical impulse, regular rhythm, normal first and second heart sounds, no murmurs, rubs or gallops Abdomen: no tenderness or distention, no masses by palpation, no abnormal pulsatility or arterial bruits, normal bowel sounds, no hepatosplenomegaly Extremities: no clubbing, cyanosis or edema; 2+ radial, ulnar and brachial pulses bilaterally; 2+ right femoral, posterior tibial and dorsalis pedis pulses; 2+ left femoral, posterior tibial and dorsalis pedis pulses; no subclavian or femoral bruits Neurological: grossly nonfocal   EKG: Sinus rhythm, minor nonspecific ST-T wave changes in the inferior leads and lead V3  Lipid Panel  May 2013 (prior to statin) total cholesterol 194, triglycerides 49, HDL 69, LDL of 115 November 2013  (on statin) total cholesterol 150, triglycerides 46, HDL 70, LDL 71, hemoglobin A1c 5.9%  BMET    Component Value Date/Time   NA 137 11/02/2011 0700   K 3.6 11/02/2011 0700   CL 102 11/02/2011 0700   CO2 26 11/02/2011 0700   GLUCOSE 97 11/02/2011 0700   BUN 4* 11/02/2011 0700   CREATININE 0.48* 11/02/2011 0700   CALCIUM 9.0 11/02/2011 0700   GFRNONAA >90 11/02/2011 0700   GFRAA >90 11/02/2011 0700     ASSESSMENT AND PLAN CAD with 99% LAD, 80% 1st diag, 80% LCX, NL LVF Now almost one year status post four-vessel bypass surgery without angina despite an active lifestyle. The focus is on secondary prevention of risk factors. She does not smoke. She is lean and fit. She does not have diabetes mellitus. Her hypertension is well controlled. The major target remains her lipid profile, but she has excellent HDL cholesterol and on the current lipid-lowering regimen her LDL is around 70. No changes are recommended. She would like to try a progressive vegetarian diet to see if her lipid profile improves to the point that we can discontinue her statin. This is not unreasonable. It is time to repeat her lipid profile today.  S/P CABG x 4, LIMA-LAD, SVG-OM2, SVG-diag, SVG-RCA  10/04/11 She has a keloid sternotomy scar and is seeing a Engineer, petroleum for this.  Dyslipidemia    HTN (hypertension) Good control, no change in medicines.   Orders Placed This Encounter  Procedures  . Comp Met (CMET)  . Lipid Profile  . EKG 12-Lead   Meds ordered this encounter  Medications  . metoprolol tartrate (LOPRESSOR) 25 MG tablet    Sig: Take 12.5 mg by mouth 2 (two) times daily.    Junious Silk, MD, [facility] [phone] office [phone] pager

## 2012-09-14 NOTE — Assessment & Plan Note (Signed)
Good control, no change in medicines.

## 2012-09-14 NOTE — Patient Instructions (Signed)
Your physician recommends that you return for lab work when fasting. Your physician recommends that you schedule a follow-up appointment in: 1 year

## 2012-09-14 NOTE — Assessment & Plan Note (Signed)
Now almost one year status post four-vessel bypass surgery without angina despite an active lifestyle. The focus is on secondary prevention of risk factors. She does not smoke. She is lean and fit. She does not have diabetes mellitus. Her hypertension is well controlled. The major target remains her lipid profile, but she has excellent HDL cholesterol and on the current lipid-lowering regimen her LDL is around 70. No changes are recommended. She would like to try a progressive vegetarian diet to see if her lipid profile improves to the point that we can discontinue her statin. This is not unreasonable. It is time to repeat her lipid profile today.

## 2012-09-19 ENCOUNTER — Telehealth: Payer: Self-pay | Admitting: Cardiovascular Disease

## 2012-09-19 NOTE — Telephone Encounter (Signed)
Message forwarded to Dr. Croitoru.  

## 2012-09-19 NOTE — Telephone Encounter (Signed)
Returned call and informed pt per instructions by MD/PA.  Pt verbalized understanding and agreed w/ plan.  

## 2012-09-19 NOTE — Telephone Encounter (Signed)
Wants to know if she can have a mammogram now? Her primary doctor wants to know this.

## 2012-09-19 NOTE — Telephone Encounter (Signed)
Yes, anytime

## 2012-11-30 ENCOUNTER — Encounter: Payer: Self-pay | Admitting: Podiatry

## 2012-11-30 ENCOUNTER — Ambulatory Visit (INDEPENDENT_AMBULATORY_CARE_PROVIDER_SITE_OTHER): Payer: Medicare Other | Admitting: Podiatry

## 2012-11-30 VITALS — BP 123/73 | HR 72 | Ht 59.0 in | Wt 104.0 lb

## 2012-11-30 DIAGNOSIS — L600 Ingrowing nail: Secondary | ICD-10-CM

## 2012-11-30 DIAGNOSIS — M25579 Pain in unspecified ankle and joints of unspecified foot: Secondary | ICD-10-CM

## 2012-11-30 NOTE — Patient Instructions (Signed)
Seen for painful nails. Ingrown nail right great toe may benefit from permanent procedure. All nails debrided. Return in 3 months or as needed.  May try Neuremedy for the next few months to see if it makes difference in neurologic symptoms on lower limbs.

## 2012-11-30 NOTE — Progress Notes (Signed)
Patient presents complaining of pain on right great toe nail. Objective: Ingrown nail with deformity medial border right hallucal nail. Thick and discolored nail plate at distal 3/4. New nail is normal appearance at proximal 1/4. Assessment: Painful ingrown nail right hallux. Plan: Debrided all nails. Resected ingrown offending borders right hallux. May benefit from permanent procedure.

## 2013-09-21 ENCOUNTER — Encounter: Payer: Self-pay | Admitting: Cardiovascular Disease

## 2013-09-21 ENCOUNTER — Ambulatory Visit (INDEPENDENT_AMBULATORY_CARE_PROVIDER_SITE_OTHER): Payer: Medicare Other | Admitting: Cardiovascular Disease

## 2013-09-21 VITALS — BP 110/70 | HR 65 | Resp 16 | Ht 59.0 in | Wt 108.1 lb

## 2013-09-21 DIAGNOSIS — E785 Hyperlipidemia, unspecified: Secondary | ICD-10-CM

## 2013-09-21 DIAGNOSIS — I10 Essential (primary) hypertension: Secondary | ICD-10-CM

## 2013-09-21 DIAGNOSIS — I2510 Atherosclerotic heart disease of native coronary artery without angina pectoris: Secondary | ICD-10-CM

## 2013-09-21 DIAGNOSIS — Z951 Presence of aortocoronary bypass graft: Secondary | ICD-10-CM

## 2013-09-21 DIAGNOSIS — Z79899 Other long term (current) drug therapy: Secondary | ICD-10-CM

## 2013-09-21 NOTE — Patient Instructions (Signed)
Your physician recommends that you schedule a follow-up appointment in: [place] physician recommends that you return for lab work CBC, Fasting lipids

## 2013-09-24 ENCOUNTER — Encounter: Payer: Self-pay | Admitting: Cardiovascular Disease

## 2013-09-24 NOTE — Assessment & Plan Note (Signed)
Last year her LDL cholesterol was 70. It is time to recheck.

## 2013-09-24 NOTE — Assessment & Plan Note (Signed)
She had high-grade stenoses in the LAD left circumflex coronary artery ("left main equivalent"). She is now asymptomatic and quite active.

## 2013-09-24 NOTE — Assessment & Plan Note (Signed)
Great control. The potential interaction between amlodipine and simvastatin is duly noted, but both are relatively low dose and she has no myalgia. 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

## 2013-09-24 NOTE — Progress Notes (Signed)
Crosby ID: Theresa Crosby, female   DOB: [birth year], 80 y.o.   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 She denies chest pain. She developed some shortness of breath when she is "stressed" but not with physical activity. Toward Theresa end of Theresa long day she will develop ankle swelling that resolves promptly with recumbent position. She describes numbness and tingling in her legs and toes. She also has stinging and pain in Theresa keloid scar from her sternotomy; third injections did not help. She was hospitalized around Theresa New Year for influenza and pneumonia. Has a history of 2 previous back surgeries in 1985 and 2013 for lumbar spine stenosis.   Allergies  Allergen Reactions  . Aspirin Other (See Comments)    GI upset,high doses  . Lactose Intolerance (Gi)     Current Outpatient Prescriptions  Medication Sig Dispense Refill  . amLODipine (NORVASC) 5 MG tablet Take 5 mg by mouth daily.        Marland Kitchen aspirin EC 81 MG tablet Take 1 tablet (81 mg total) by mouth daily.  100 tablet  0  . brimonidine (ALPHAGAN P) 0.1 % SOLN Place 1 drop into both eyes 2 (two) times daily.        . Calcium-Vitamin D-Vitamin K 500-100-40 MG-UNT-MCG CHEW Chew 1 tablet by mouth 2 (two) times daily.        . Cholecalciferol (VITAMIN D3) 1000 UNITS CAPS Take 2,000 Units by mouth every morning.        . cycloSPORINE (RESTASIS) 0.05 % ophthalmic emulsion Place 1 drop into both eyes 2 (two) times daily.      . fluticasone (FLONASE) 50 MCG/ACT nasal spray Place 2 sprays into Theresa nose as needed for rhinitis.      . hydrocortisone cream 1 % Apply topically 2 (two) times daily.      Marland Kitchen latanoprost (XALATAN) 0.005 % ophthalmic solution Place 1 drop into both eyes at bedtime.        . metoprolol tartrate (LOPRESSOR) 25 MG tablet Take 12.5 mg by mouth 2 (two) times daily.      .  nitroGLYCERIN (NITROSTAT) 0.4 MG SL tablet Place 1 tablet (0.4 mg total) under Theresa tongue every 5 (five) minutes as needed for chest pain.  100 tablet  0  . pantoprazole (PROTONIX) 40 MG tablet Take 40 mg by mouth daily.      . simvastatin (ZOCOR) 20 MG tablet Take 1 tablet (20 mg total) by mouth daily at 6 PM.  30 tablet  1   No current facility-administered medications for this visit.    Past Medical History  Diagnosis Date  . Hypertension     ireg. heartbeat, no referral made to cardiologist , sees [facility]. Med.   . Shortness of breath   . Blood transfusion     postop- back surg. 1987  . GERD (gastroesophageal reflux disease)   . Arthritis     degeneration - spondylosis, OA - all over body,   . Neuromuscular disorder     burning- L leg , numbing of toes   . Anxiety     2005- related to care of aging parent   . Chest pain at rest associated with dyspnea 10/05/2011  . Abnormal nuclear stress test 10/05/2011  . CAD (coronary artery disease)significant diseas with 99% LAD, 80% 1st diag,  80% LCX 10/05/2011  . S/P CABG x 4, LIMA-LAD, SVG-OM2, SVG-diag, SVG-RCA secondary to symptomatic CAD and positive stress test 10/05/2011  . Bunion   . Hyperlipemia     Past Surgical History  Procedure Laterality Date  . Back surgery      1987-   . Eye surgery      cataracts removed- bilateral, no IOL  . Abdominal hysterectomy      partial hysterectomy- 1972  . Tubal ligation    . Toe surgery      R foot- removed calus between 5-4  . Lumbar laminectomy/decompression microdiscectomy  03/04/2011    Procedure: LUMBAR LAMINECTOMY/DECOMPRESSION MICRODISCECTOMY;  Surgeon: Hosie Spangle, MD;  Location: [facility];  Service: Neurosurgery;  Laterality: N/A;  Thoracic Ten-Thoracic Twelve Thoracic Laminectomy  . Coronary artery bypass graft  10/04/2011    Procedure: CORONARY ARTERY BYPASS GRAFTING (CABG);  Surgeon: Gaye Pollack, MD;  Location: [facility];  Service: Open Heart Surgery;  Laterality: N/A;  CABG x  four using left internal mammary artery and right leg greater saphenous vein   . Foot surgery Right January 2013    HT 5  . Nm myoview ltd  09/15/2011    mild ischemia mid anterior & apical anterior region.EF: 86%  . Cardiac catheterization  10/12/2011    high-grade ca+ prox. LAD diagonal branch,higly diseased diagonal branch & obtuse marginal branch    Family History  Problem Relation Age of Onset  . Anesthesia problems Neg Hx   . Hypotension Neg Hx   . Malignant hyperthermia Neg Hx   . Pseudochol deficiency Neg Hx   . Heart attack Mother   . Heart attack Father   . Hypertension Father   . Heart attack Brother     History   Social History  . Marital Status: Widowed    Spouse Name: N/A    Number of Children: N/A  . Years of Education: N/A   Occupational History  . Not on file.   Social History Main Topics  . Smoking status: Never Smoker   . Smokeless tobacco: Never Used  . Alcohol Use: No  . Drug Use: No  . Sexual Activity: Not on file   Other Topics Concern  . Not on file   Social History Narrative  . No narrative on file    Review of systems: Theresa Crosby specifically denies any chest pain at rest or with exertion, dyspnea at rest or with exertion, orthopnea, paroxysmal nocturnal dyspnea, syncope, palpitations, focal neurological deficits, intermittent claudication, lower extremity edema, unexplained weight gain, cough, hemoptysis or wheezing.  Theresa Crosby also denies abdominal pain, nausea, vomiting, dysphagia, diarrhea, constipation, polyuria, polydipsia, dysuria, hematuria, frequency, urgency, abnormal bleeding or bruising, fever, chills, unexpected weight changes, mood swings, change in skin or hair texture, change in voice quality, auditory or visual problems, allergic reactions or rashes, new musculoskeletal complaints other than usual "aches and pains".   PHYSICAL EXAM BP 110/70  Pulse 65  Resp 16  Ht '4\' 11"'  (1.499 m)  Wt 108 lb 1.6 oz (49.034 kg)  BMI  21.82 kg/m2  General: Alert, oriented x3, no distress Head: no evidence of trauma, PERRL, EOMI, no exophtalmos or lid lag, no myxedema, no xanthelasma; normal ears, nose and oropharynx Neck: normal jugular venous pulsations and no hepatojugular reflux; brisk carotid pulses without delay and no carotid bruits Chest: clear to auscultation, no signs of consolidation by percussion or palpation, normal fremitus, symmetrical and full respiratory excursions; keloid sternotomy scar, exuberant  around Theresa inferior end of Theresa scar Cardiovascular: normal position and quality of Theresa apical impulse, regular rhythm, normal first and second heart sounds, no murmurs, rubs or gallops Abdomen: no tenderness or distention, no masses by palpation, no abnormal pulsatility or arterial bruits, normal bowel sounds, no hepatosplenomegaly Extremities: no clubbing, cyanosis or edema; 2+ radial, ulnar and brachial pulses bilaterally; 2+ right femoral, posterior tibial and dorsalis pedis pulses; 2+ left femoral, posterior tibial and dorsalis pedis pulses; no subclavian or femoral bruits Neurological: grossly nonfocal   EKG: Normal sinus rhythm, normal tracing  BMET    Component Value Date/Time   NA 137 11/02/2011 0700   K 3.6 11/02/2011 0700   CL 102 11/02/2011 0700   CO2 26 11/02/2011 0700   GLUCOSE 97 11/02/2011 0700   BUN 4* 11/02/2011 0700   CREATININE 0.48* 11/02/2011 0700   CALCIUM 9.0 11/02/2011 0700   GFRNONAA >90 11/02/2011 0700   GFRAA >90 11/02/2011 0700     ASSESSMENT AND PLAN S/P CABG x 4, LIMA-LAD, SVG-OM2, SVG-diag, SVG-RCA  10/04/11 She had high-grade stenoses in Theresa LAD left circumflex coronary artery ("left main equivalent"). She is now asymptomatic and quite active.  Dyslipidemia Last year her LDL cholesterol was 70. It is time to recheck.  HTN (hypertension) Great control. Theresa potential interaction between amlodipine and simvastatin is duly noted, but both are relatively low dose and she has no  myalgia. 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 Comp Met (CMET)  . Lipid Profile  . EKG 12-Lead   No orders of Theresa defined types were placed in this encounter.    Holli Humbles, MD, [place] [phone] office [phone] pager

## 2013-09-25 ENCOUNTER — Telehealth: Payer: Self-pay | Admitting: *Deleted

## 2013-09-25 NOTE — Telephone Encounter (Signed)
Message copied by Tressa Busman on Mon Sep 25, 2013  8:12 AM ------      Message from: Sanda Klein      Created: Sun Sep 24, 2013  9:32 PM       Her current PCP is Clint Guy , nurse practitioner with [facility] in Dr. Vicenta Dunning office. Please make sure they get the note. thank you      MCr ------

## 2013-09-25 NOTE — Telephone Encounter (Signed)
Office note of 09/21/13 faxed to PCP.

## 2013-09-28 ENCOUNTER — Encounter: Payer: Self-pay | Admitting: *Deleted

## 2013-09-28 NOTE — Progress Notes (Signed)
Patient ID: Theresa Crosby, female   DOB: 1932/04/24, 80 y.o.   MRN: [id number] Ms. Kastens is 2 years post op CABG.  She developed keloid formation of her sternal incision.  This caused her discmfort ans she has been seeing Dr. Towanda Malkin for treatment.  She does not feel the injections are helping and they are painful.  I suggested Mederma, although I said it may not help at this late date.  She will call back if she wants to see Dr. Cyndia Bent again.

## 2013-10-05 LAB — COMPREHENSIVE METABOLIC PANEL
ALT: 20 U/L (ref 0–35)
AST: 28 U/L (ref 0–37)
Albumin: 4.5 g/dL (ref 3.5–5.2)
Alkaline Phosphatase: 94 U/L (ref 39–117)
BUN: 12 mg/dL (ref 6–23)
CO2: 29 mEq/L (ref 19–32)
Calcium: 9.5 mg/dL (ref 8.4–10.5)
Chloride: 101 mEq/L (ref 96–112)
Creat: 0.79 mg/dL (ref 0.50–1.10)
Glucose, Bld: 82 mg/dL (ref 70–99)
Potassium: 3.8 mEq/L (ref 3.5–5.3)
Sodium: 139 mEq/L (ref 135–145)
Total Bilirubin: 0.7 mg/dL (ref 0.2–1.2)
Total Protein: 6.7 g/dL (ref 6.0–8.3)

## 2013-10-05 LAB — LIPID PANEL
Cholesterol: 171 mg/dL (ref 0–200)
HDL: 72 mg/dL (ref >39)
LDL Cholesterol: 86 mg/dL (ref 0–99)
Total CHOL/HDL Ratio: 2.4 Ratio
Triglycerides: 63 mg/dL (ref <150)
VLDL: 13 mg/dL (ref 0–40)

## 2013-12-18 ENCOUNTER — Encounter: Payer: Self-pay | Admitting: Podiatry

## 2013-12-18 ENCOUNTER — Ambulatory Visit (INDEPENDENT_AMBULATORY_CARE_PROVIDER_SITE_OTHER): Payer: Medicare Other | Admitting: Podiatry

## 2013-12-18 VITALS — BP 141/71 | HR 78

## 2013-12-18 DIAGNOSIS — B351 Tinea unguium: Secondary | ICD-10-CM

## 2013-12-18 DIAGNOSIS — M79606 Pain in leg, unspecified: Secondary | ICD-10-CM | POA: Insufficient documentation

## 2013-12-18 NOTE — Progress Notes (Signed)
Subjective: Patient presents complaining of pain on right great toe nail.  Objective: Thick deformed nail right great toe with fungal debris. Thick and discolored nail plate at distal 3/4.  Thick hypertrophic nails x 10.  Assessment: Painful nails x 10.  Plan: Debrided all nails.  Return as needed.

## 2013-12-18 NOTE — Patient Instructions (Signed)
Seen for hypertrophic nails x 10. All nails debrided.  Return as needed.  

## 2014-01-19 ENCOUNTER — Other Ambulatory Visit: Payer: Self-pay | Admitting: Neurosurgery

## 2014-01-19 DIAGNOSIS — M48061 Spinal stenosis, lumbar region without neurogenic claudication: Secondary | ICD-10-CM

## 2014-01-25 ENCOUNTER — Encounter (HOSPITAL_COMMUNITY): Payer: Self-pay | Admitting: Cardiovascular Disease

## 2014-01-29 ENCOUNTER — Ambulatory Visit (INDEPENDENT_AMBULATORY_CARE_PROVIDER_SITE_OTHER): Payer: Medicare Other | Admitting: Podiatry

## 2014-01-29 ENCOUNTER — Encounter: Payer: Self-pay | Admitting: Podiatry

## 2014-01-29 VITALS — BP 152/86 | HR 76

## 2014-01-29 DIAGNOSIS — I2510 Atherosclerotic heart disease of native coronary artery without angina pectoris: Secondary | ICD-10-CM

## 2014-01-29 DIAGNOSIS — L600 Ingrowing nail: Secondary | ICD-10-CM

## 2014-01-29 NOTE — Progress Notes (Signed)
Subjective: Patient presents complaining of pain on left great toe nail. Also complaining of neuropathic foot pain. Patient just lost her brother who had the same neuropathy condition with back pain like she has .  Patient is wearing pointed dress boot.   Objective: Ingrown nail on left great toe lateral border.  No infection or inflammation.  Pain in lower limb.   Assessment: Ingrown nail. Peripheral neuropathy.  Plan: Debrided affected ingrown nail. We will do ingrown nail surgery next week.  Advised to change shoes. May benefit from Neuremedy for nerve pain.

## 2014-01-29 NOTE — Patient Instructions (Signed)
Seen for painful nail left great toe lateral border.  Return next week for ingrown nail surgery.

## 2014-02-01 ENCOUNTER — Ambulatory Visit
Admission: RE | Admit: 2014-02-01 | Discharge: 2014-02-01 | Disposition: A | Discharge status: Home / Self Care | Payer: Medicare Other | Source: Ambulatory Visit | Attending: Neurosurgery | Admitting: Neurosurgery

## 2014-02-01 DIAGNOSIS — M48061 Spinal stenosis, lumbar region without neurogenic claudication: Secondary | ICD-10-CM

## 2014-02-01 MED ORDER — GADOBENATE DIMEGLUMINE 529 MG/ML IV SOLN: 9.0000 mL | Freq: Once | INTRAVENOUS | Status: AC | PRN

## 2014-02-06 ENCOUNTER — Ambulatory Visit: Payer: Medicare Other | Admitting: Podiatry

## 2014-02-13 ENCOUNTER — Encounter: Payer: Self-pay | Admitting: Podiatry

## 2014-02-13 ENCOUNTER — Ambulatory Visit (INDEPENDENT_AMBULATORY_CARE_PROVIDER_SITE_OTHER): Payer: Medicare Other | Admitting: Podiatry

## 2014-02-13 VITALS — BP 136/71 | HR 69

## 2014-02-13 DIAGNOSIS — L600 Ingrowing nail: Secondary | ICD-10-CM

## 2014-02-13 DIAGNOSIS — M79675 Pain in left toe(s): Secondary | ICD-10-CM

## 2014-02-13 NOTE — Patient Instructions (Signed)
Ingrown nail surgery done on left lateral. Follow soaking instruction and return in one week.

## 2014-02-13 NOTE — Progress Notes (Signed)
80 year old female presents for ingrown nail surgery on left great toe. Phenol and Alcohol matrixectomy done on left hallux lateral border. Local used with 4 ml of 50/50 mixture 1% Xylocaine plain and 0.5% Marcaine with epinephrine.  Patient tolerated the procedure well. Soaking instruction and supply dispensed with one week appointment.

## 2014-02-20 ENCOUNTER — Ambulatory Visit (INDEPENDENT_AMBULATORY_CARE_PROVIDER_SITE_OTHER): Payer: Medicare Other | Admitting: Podiatry

## 2014-02-20 ENCOUNTER — Encounter: Payer: Self-pay | Admitting: Podiatry

## 2014-02-20 DIAGNOSIS — L600 Ingrowing nail: Secondary | ICD-10-CM

## 2014-02-20 IMAGING — CR DG CHEST 1V PORT
1 series · 1 of 1 positions shown · non-contrast
Comparison: 09/24/2011.

CLINICAL DATA: Postop.

PORTABLE CHEST - 1 VIEW

[AP]
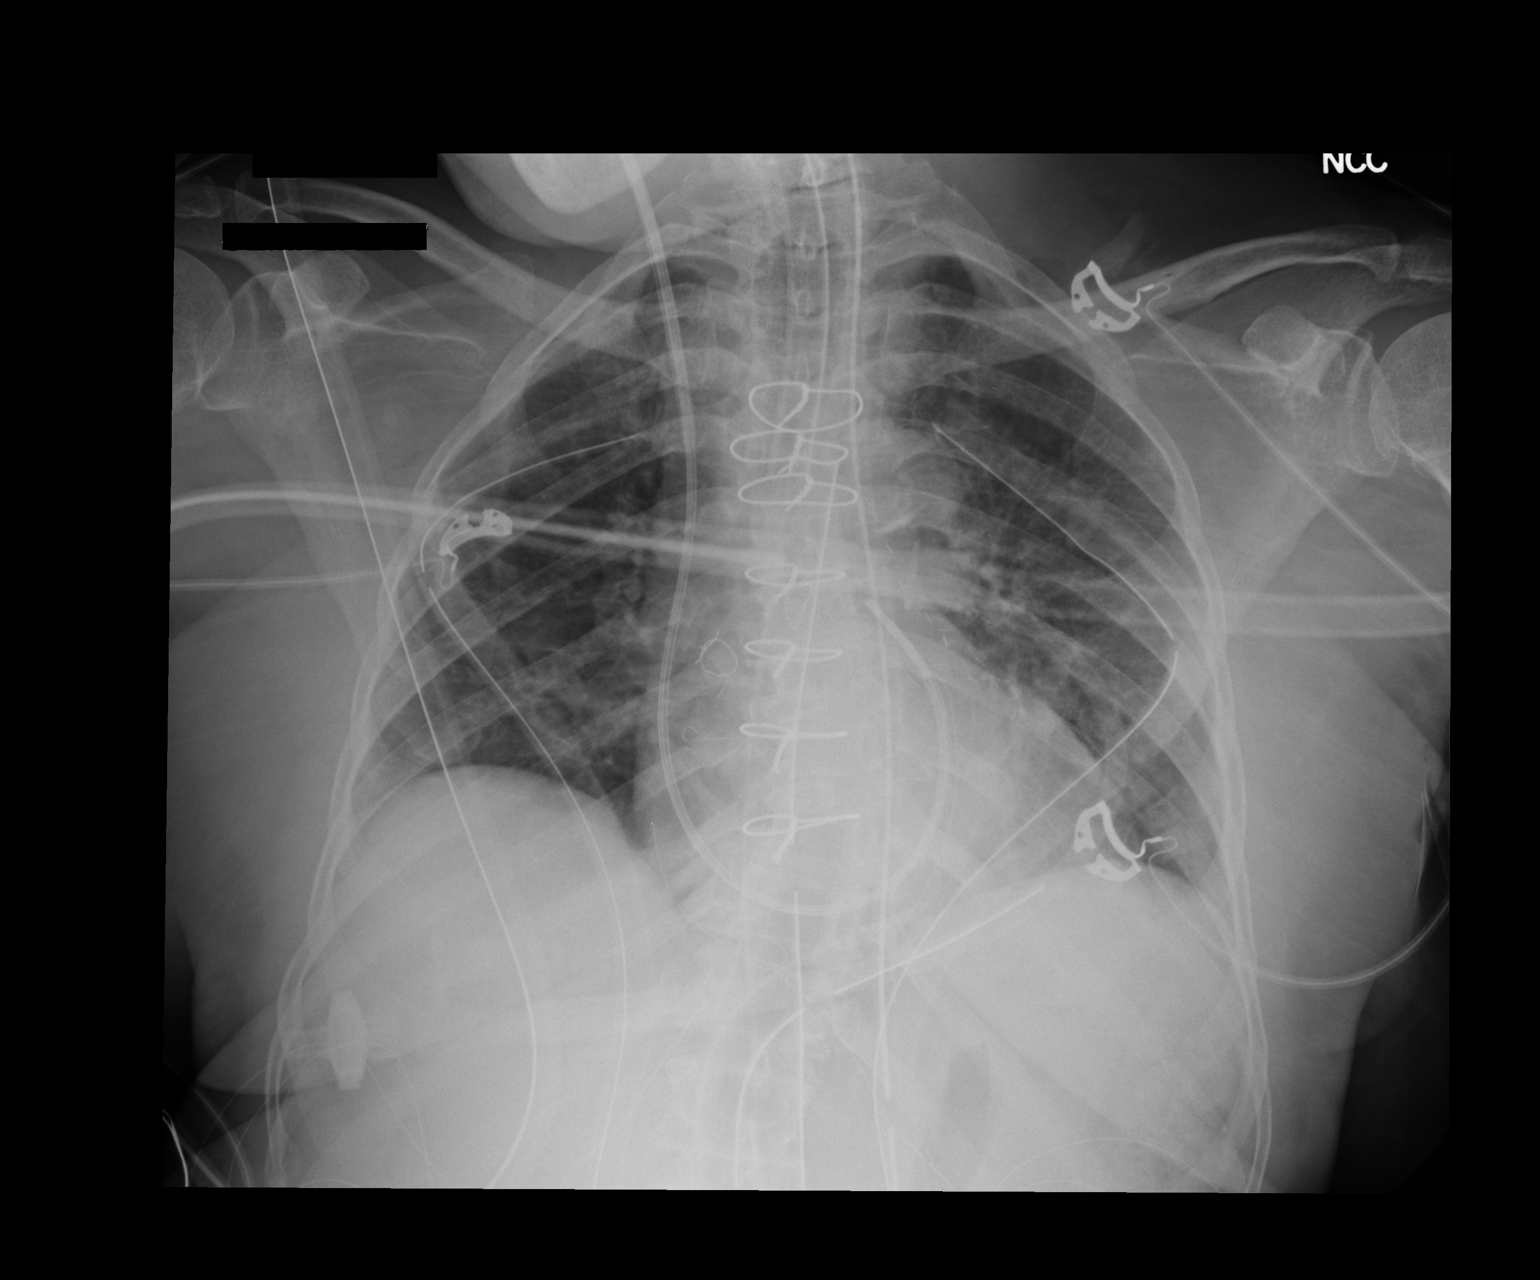

[1 of 1 positions shown; findings below may reference images not displayed]

FINDINGS: Endotracheal tube terminates approximately 1.9 cm above
the estimated location of the carina.  Nasogastric tube is followed
into the stomach.  Right IJ Swan-Ganz catheter tip projects over
the proximal right pulmonary artery.  Bilateral chest tubes,
mediastinal drains and epicardial pacer wires are in place.

Heart size within normal limits given postoperative status and semi
upright AP technique.  Lungs are somewhat low in volume with mild
interstitial prominence.  No definite pleural fluid.  No definite
pneumothorax.
IMPRESSION: 1.  Interval median sternotomy with support apparatus in place.
2.  Low lung volumes with probable vascular crowding rather than
pulmonary edema.

## 2014-02-20 NOTE — Progress Notes (Signed)
Status post ingrown nail surgery right great toe. Healing well. Continue to soak till pain stop. Return as needed.

## 2014-02-20 NOTE — Patient Instructions (Signed)
Doing well with nail surgery. Continue soaking till tenderness stop. Return as needed.

## 2014-02-21 IMAGING — CR DG CHEST 1V PORT
1 series · 1 of 1 positions shown · non-contrast
Comparison: [DATE]

CLINICAL DATA: Follow-up CABG

PORTABLE CHEST - 1 VIEW

[AP]
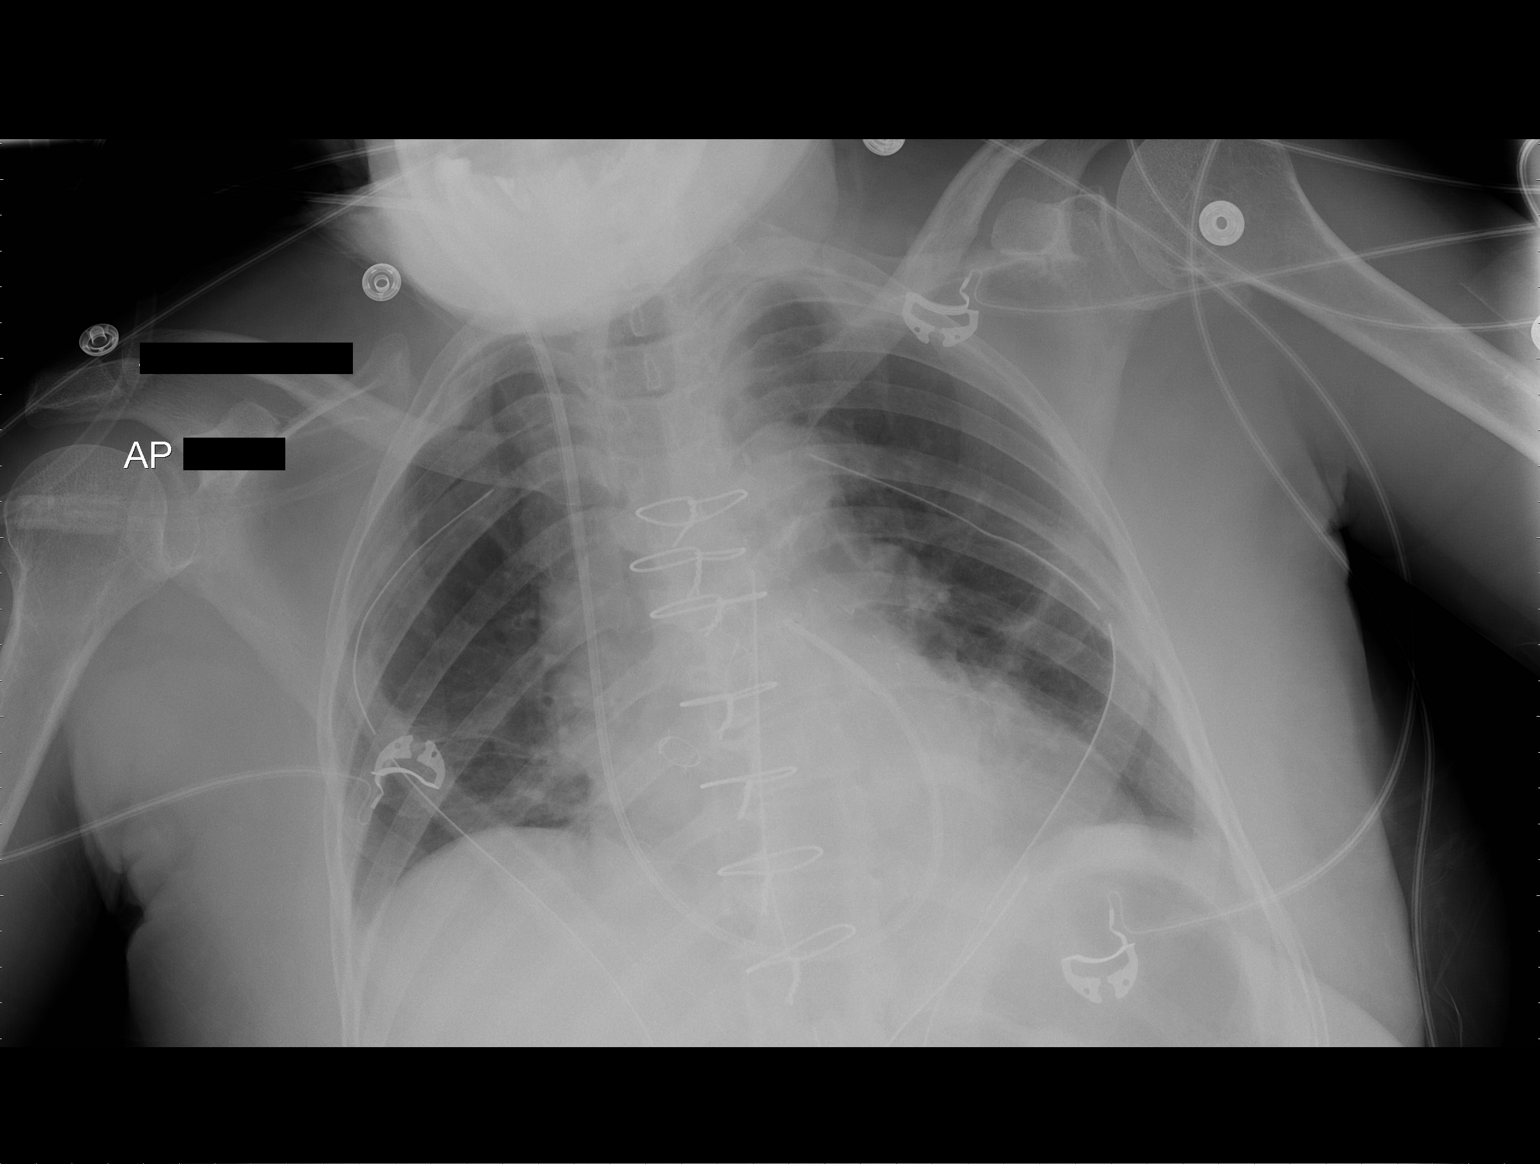

[1 of 1 positions shown; findings below may reference images not displayed]

FINDINGS: Endotracheal tube, nasogastric tube have been removed.
Mediastinal drain remains in place.  Swan-Ganz catheter has its tip
in the main pulmonary artery.  Bilateral chest tubes are in place.
No pneumothorax.  Mild basilar atelectasis bilaterally.
IMPRESSION: Good appearance following endotracheal tube and nasogastric tube
removal.  No pneumothorax.  Mild basilar atelectasis.

## 2014-02-23 IMAGING — CR DG CHEST 2V
2 series · 2 of 2 positions shown · non-contrast
Comparison: 10/05/2011

CLINICAL DATA: CABG, weakness.

CHEST - 2 VIEW

[w chest pa]
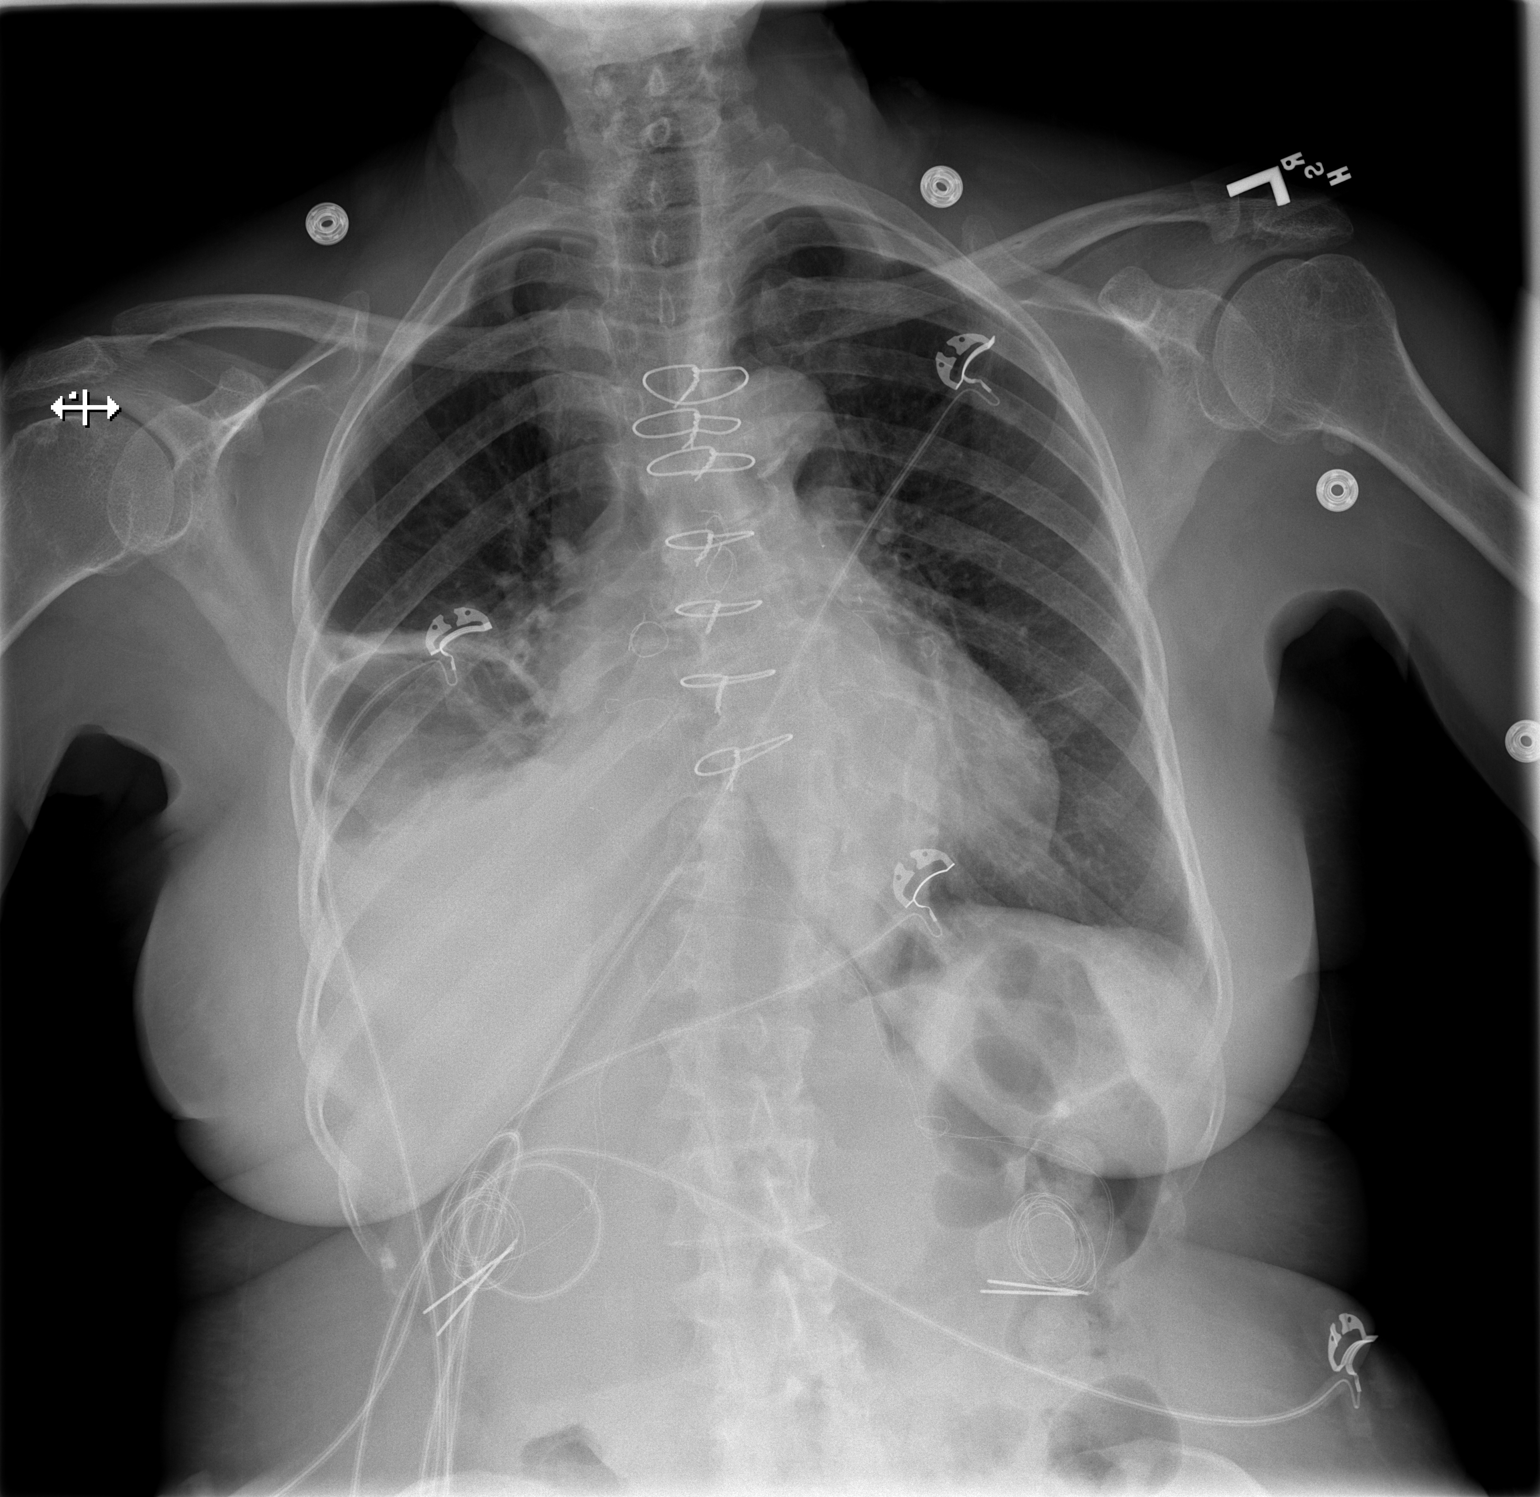

[w chest lat]
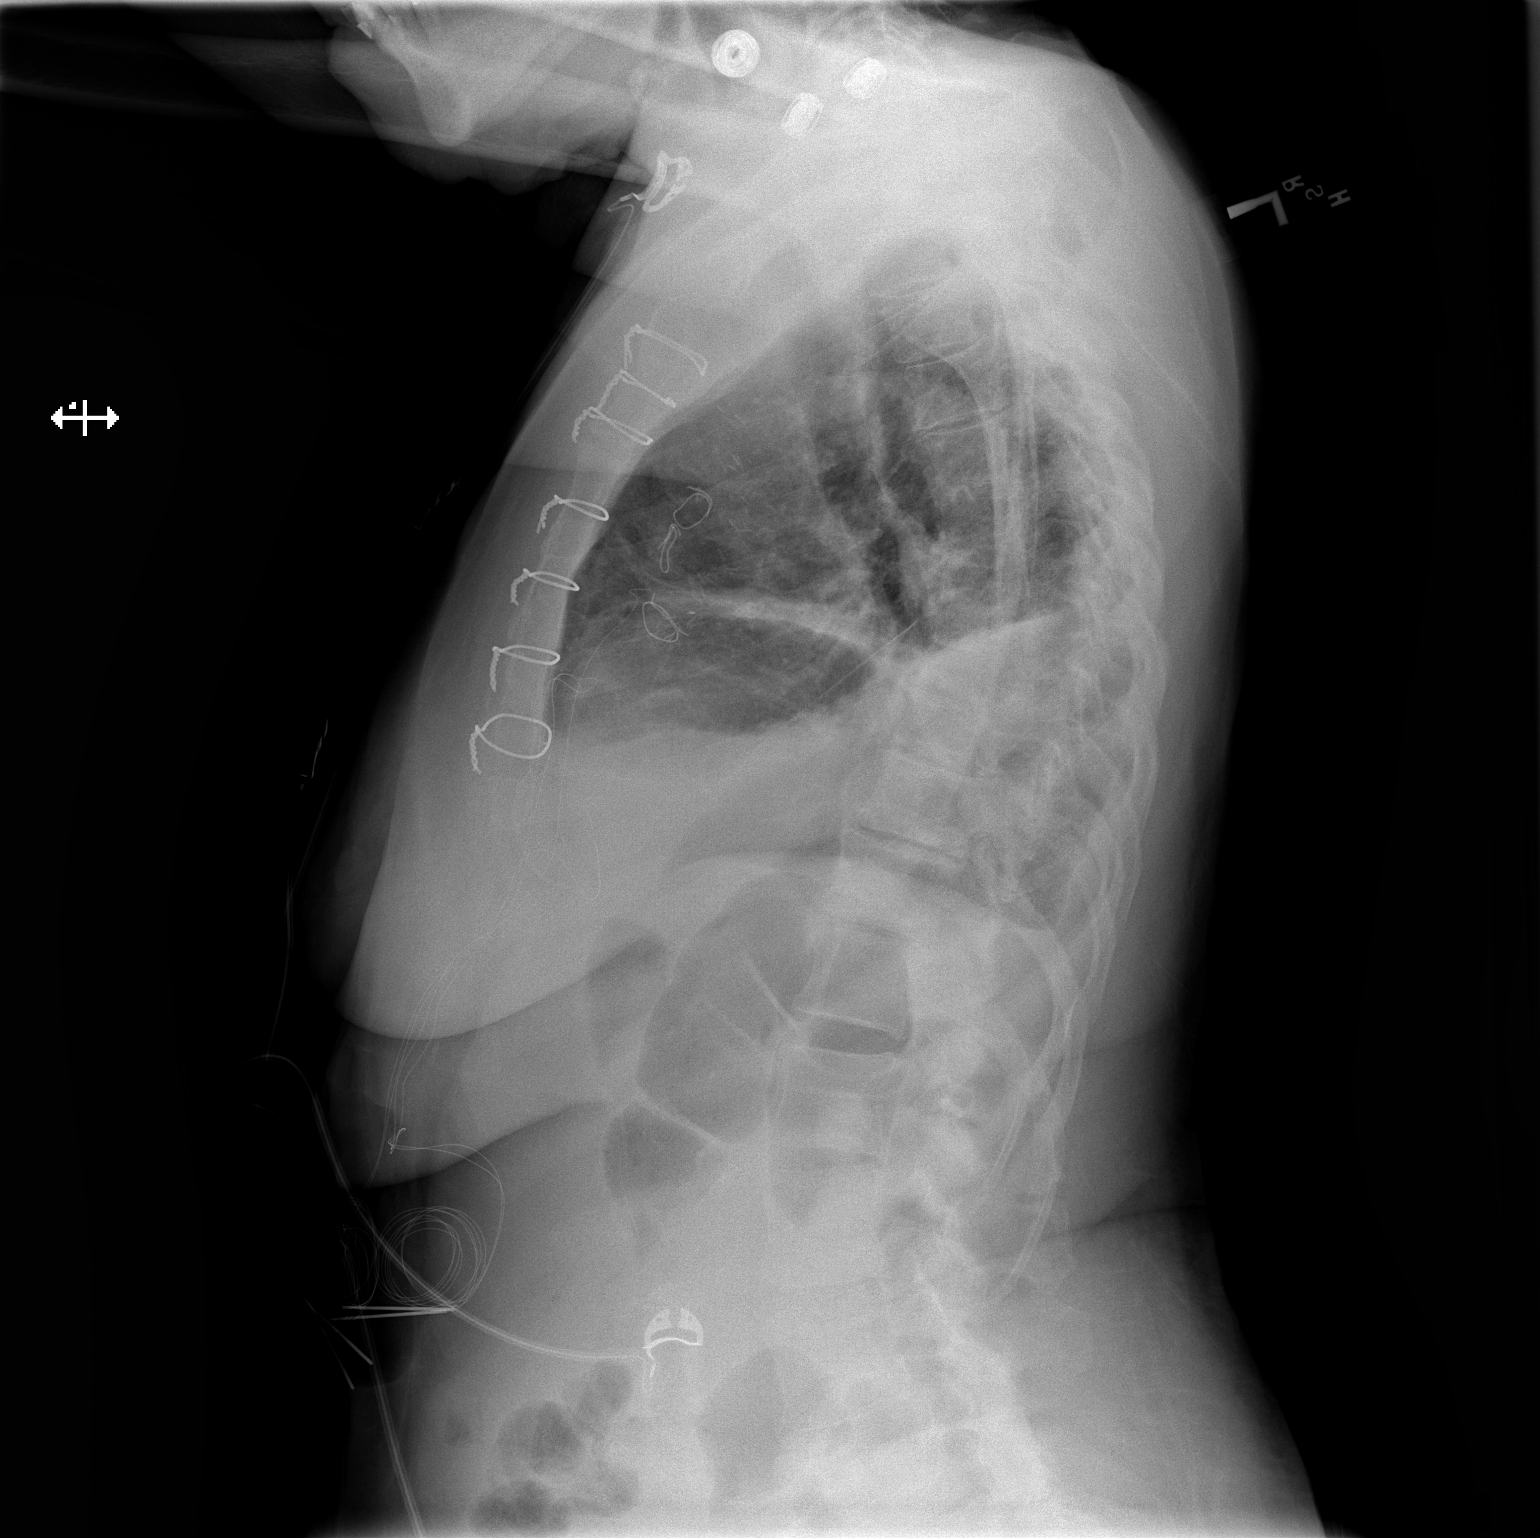

[2 of 2 positions shown; findings below may reference images not displayed]

FINDINGS: Interval removal of bilateral chest tubes and Swan-Ganz
catheter.  No pneumothorax.  Increasing right basilar atelectasis
and small right effusion.  Mild cardiomegaly.
IMPRESSION: No pneumothorax following bilateral chest tube removal.

Increasing right base atelectasis and right effusion.

## 2014-02-28 ENCOUNTER — Ambulatory Visit (INDEPENDENT_AMBULATORY_CARE_PROVIDER_SITE_OTHER): Payer: Medicare Other | Admitting: Podiatry

## 2014-02-28 ENCOUNTER — Encounter: Payer: Self-pay | Admitting: Podiatry

## 2014-02-28 DIAGNOSIS — L600 Ingrowing nail: Secondary | ICD-10-CM

## 2014-02-28 NOTE — Progress Notes (Signed)
One week post op nail. Stated that she had worn heels on Saturday. The surgical site has shown small area of blood blister at proximal skin folder possible due to shoe friction.  Area drained with #15 blade. Betadine dressing applied. Continue to soak another week and return in one week.

## 2014-02-28 NOTE — Patient Instructions (Signed)
Post op nail wound. Need to soak for another week. Return in one week.

## 2014-03-07 ENCOUNTER — Ambulatory Visit (INDEPENDENT_AMBULATORY_CARE_PROVIDER_SITE_OTHER): Payer: Medicare Other | Admitting: Podiatry

## 2014-03-07 ENCOUNTER — Encounter: Payer: Self-pay | Admitting: Podiatry

## 2014-03-07 DIAGNOSIS — L600 Ingrowing nail: Secondary | ICD-10-CM

## 2014-03-07 NOTE — Patient Instructions (Signed)
Good wound healing following nail surgery right great toe. Return for RFC.

## 2014-03-07 NOTE — Progress Notes (Signed)
Old surgical site has dried out and has minimum discomfort. Advised to keep it covered during the day and leave it open at night.

## 2014-03-26 ENCOUNTER — Ambulatory Visit (INDEPENDENT_AMBULATORY_CARE_PROVIDER_SITE_OTHER): Payer: Medicare Other | Admitting: Podiatry

## 2014-03-26 ENCOUNTER — Encounter: Payer: Self-pay | Admitting: Podiatry

## 2014-03-26 DIAGNOSIS — M79606 Pain in leg, unspecified: Secondary | ICD-10-CM

## 2014-03-26 DIAGNOSIS — B351 Tinea unguium: Secondary | ICD-10-CM

## 2014-03-26 DIAGNOSIS — M79673 Pain in unspecified foot: Secondary | ICD-10-CM

## 2014-03-26 NOTE — Progress Notes (Signed)
Subjective: 81 year old female presents requesting toe nails trimmed and check on toe nails surgery on right great toe.   Objective: Thick hypertrophic nails x 10. Pedal pulses are palpable. No open lesions. Surgical site covered with scab at proximal medial border right hallux.   Assessment: Painful mycotic nails x 10. Healing post op nail site right hallux medial border.   Plan: Debrided all nails.  Return in 3 months.

## 2014-03-26 NOTE — Patient Instructions (Signed)
Nail surgery site has healed and covered with scab.  All nails debrided.  Return in 3 months.

## 2014-04-25 DIAGNOSIS — M79673 Pain in unspecified foot: Secondary | ICD-10-CM

## 2014-05-16 ENCOUNTER — Ambulatory Visit: Payer: Medicare Other | Admitting: Podiatry

## 2014-06-05 ENCOUNTER — Encounter: Payer: Self-pay | Admitting: Podiatry

## 2014-06-05 ENCOUNTER — Ambulatory Visit (INDEPENDENT_AMBULATORY_CARE_PROVIDER_SITE_OTHER): Payer: Medicare Other | Admitting: Podiatry

## 2014-06-05 VITALS — BP 131/81 | HR 61

## 2014-06-05 DIAGNOSIS — M79606 Pain in leg, unspecified: Secondary | ICD-10-CM

## 2014-06-05 DIAGNOSIS — B351 Tinea unguium: Secondary | ICD-10-CM

## 2014-06-05 NOTE — Progress Notes (Signed)
Subjective: 81 year old female presents requesting toe nails trimmed and check on toe nails surgery on right great toe.   Objective: Thick hypertrophic nails x 10. Pedal pulses are palpable. No open lesions. Surgical site covered with scab at proximal medial border right hallux.   Assessment: Painful mycotic nails x 10. Healing post op nail site right hallux medial border.   Plan: Debrided all nails.  Return in 3 months.

## 2014-06-05 NOTE — Patient Instructions (Signed)
Debrided all nails. Problem with right ankle. May benefit from lace up tennis shoes and custom shoe inserts. Return as needed.

## 2014-06-25 ENCOUNTER — Ambulatory Visit: Payer: Medicare Other | Admitting: Podiatry

## 2014-07-04 ENCOUNTER — Encounter (HOSPITAL_BASED_OUTPATIENT_CLINIC_OR_DEPARTMENT_OTHER): Payer: Self-pay

## 2014-07-04 ENCOUNTER — Emergency Department (HOSPITAL_BASED_OUTPATIENT_CLINIC_OR_DEPARTMENT_OTHER)
Admission: EM | Admit: 2014-07-04 | Discharge: 2014-07-04 | Disposition: A | Discharge status: Home / Self Care | Payer: Medicare Other | Attending: Emergency Medicine | Admitting: Emergency Medicine

## 2014-07-04 DIAGNOSIS — Z8659 Personal history of other mental and behavioral disorders: Secondary | ICD-10-CM | POA: Insufficient documentation

## 2014-07-04 DIAGNOSIS — Z79899 Other long term (current) drug therapy: Secondary | ICD-10-CM | POA: Insufficient documentation

## 2014-07-04 DIAGNOSIS — Z8669 Personal history of other diseases of the nervous system and sense organs: Secondary | ICD-10-CM | POA: Insufficient documentation

## 2014-07-04 DIAGNOSIS — I2510 Atherosclerotic heart disease of native coronary artery without angina pectoris: Secondary | ICD-10-CM | POA: Insufficient documentation

## 2014-07-04 DIAGNOSIS — Z9889 Other specified postprocedural states: Secondary | ICD-10-CM | POA: Insufficient documentation

## 2014-07-04 DIAGNOSIS — Z7982 Long term (current) use of aspirin: Secondary | ICD-10-CM | POA: Diagnosis not present

## 2014-07-04 DIAGNOSIS — Z951 Presence of aortocoronary bypass graft: Secondary | ICD-10-CM | POA: Insufficient documentation

## 2014-07-04 DIAGNOSIS — L509 Urticaria, unspecified: Secondary | ICD-10-CM | POA: Diagnosis not present

## 2014-07-04 DIAGNOSIS — Z8639 Personal history of other endocrine, nutritional and metabolic disease: Secondary | ICD-10-CM | POA: Diagnosis not present

## 2014-07-04 DIAGNOSIS — K219 Gastro-esophageal reflux disease without esophagitis: Secondary | ICD-10-CM | POA: Insufficient documentation

## 2014-07-04 DIAGNOSIS — I10 Essential (primary) hypertension: Secondary | ICD-10-CM | POA: Diagnosis not present

## 2014-07-04 DIAGNOSIS — M1990 Unspecified osteoarthritis, unspecified site: Secondary | ICD-10-CM | POA: Diagnosis not present

## 2014-07-04 DIAGNOSIS — R21 Rash and other nonspecific skin eruption: Secondary | ICD-10-CM | POA: Diagnosis present

## 2014-07-04 DIAGNOSIS — Z7951 Long term (current) use of inhaled steroids: Secondary | ICD-10-CM | POA: Insufficient documentation

## 2014-07-04 MED ORDER — PREDNISONE 10 MG PO TABS: 20.0000 mg | ORAL_TABLET | Freq: Two times a day (BID) | ORAL | Status: DC

## 2014-07-04 NOTE — ED Provider Notes (Signed)
CSN: [id number]     Arrival date & time 07/04/14  1217 History   First MD Initiated Contact with Patient 07/04/14 1305     Chief Complaint  Patient presents with  . Allergic Reaction     (Consider location/radiation/quality/duration/timing/severity/associated sxs/prior Treatment) HPI Comments: Patient is an 81 year old female with history of coronary artery bypass. She presents for evaluation of itchy rash to her arms, legs, and torso. She denies any new contacts or exposures. She denies any difficulty breathing or swallowing does report that her throat feels scratchy.  Patient is a 81 y.o. female presenting with allergic reaction. The history is provided by the patient.  Allergic Reaction Presenting symptoms: itching and rash   Presenting symptoms: no difficulty breathing and no difficulty swallowing   Rash:    Location:  Full body   Quality comment:  Welts   Severity:  Moderate   Onset quality:  Sudden   Duration:  1 day   Timing:  Constant   Progression:  Worsening Severity:  Moderate Prior allergic episodes:  No prior episodes Context: no chemicals, no cosmetics, no food allergies, no new detergents/soaps and no nuts   Relieved by:  Nothing Worsened by:  Nothing tried   Past Medical History  Diagnosis Date  . Hypertension     ireg. heartbeat, no referral made to cardiologist , sees [facility]. Med.   . Shortness of breath   . Blood transfusion     postop- back surg. 1987  . GERD (gastroesophageal reflux disease)   . Arthritis     degeneration - spondylosis, OA - all over body,   . Neuromuscular disorder     burning- L leg , numbing of toes   . Anxiety     2005- related to care of aging parent   . Chest pain at rest associated with dyspnea 10/05/2011  . Abnormal nuclear stress test 10/05/2011  . CAD (coronary artery disease)significant diseas with 99% LAD, 80% 1st diag, 80% LCX 10/05/2011  . S/P CABG x 4, LIMA-LAD, SVG-OM2, SVG-diag, SVG-RCA secondary to symptomatic CAD and  positive stress test 10/05/2011  . Bunion   . Hyperlipemia    Past Surgical History  Procedure Laterality Date  . Back surgery      1987-   . Eye surgery      cataracts removed- bilateral, no IOL  . Abdominal hysterectomy      partial hysterectomy- 1972  . Tubal ligation    . Toe surgery      R foot- removed calus between 5-4  . Lumbar laminectomy/decompression microdiscectomy  03/04/2011    Procedure: LUMBAR LAMINECTOMY/DECOMPRESSION MICRODISCECTOMY;  Surgeon: Hosie Spangle, MD;  Location: [facility];  Service: Neurosurgery;  Laterality: N/A;  Thoracic Ten-Thoracic Twelve Thoracic Laminectomy  . Coronary artery bypass graft  10/04/2011    Procedure: CORONARY ARTERY BYPASS GRAFTING (CABG);  Surgeon: Gaye Pollack, MD;  Location: [facility];  Service: Open Heart Surgery;  Laterality: N/A;  CABG x four using left internal mammary artery and right leg greater saphenous vein   . Foot surgery Right January 2013    HT 5  . Nm myoview ltd  09/15/2011    mild ischemia mid anterior & apical anterior region.EF: 86%  . Cardiac catheterization  10/12/2011    high-grade ca+ prox. LAD diagonal branch,higly diseased diagonal branch & obtuse marginal branch  . Left heart catheterization with coronary angiogram N/A 10/02/2011    Procedure: LEFT HEART CATHETERIZATION WITH CORONARY ANGIOGRAM;  Surgeon: Pearletha Forge  Gwenlyn Found, MD;  Location: [facility];  Service: Cardiovascular;  Laterality: N/A;   Family History  Problem Relation Age of Onset  . Anesthesia problems Neg Hx   . Hypotension Neg Hx   . Malignant hyperthermia Neg Hx   . Pseudochol deficiency Neg Hx   . Heart attack Mother   . Heart attack Father   . Hypertension Father   . Heart attack Brother    History  Substance Use Topics  . Smoking status: Never Smoker   . Smokeless tobacco: Never Used  . Alcohol Use: No   OB History    No data available     Review of Systems  HENT: Negative for trouble swallowing.   Skin: Positive for itching  and rash.  All other systems reviewed and are negative.     Allergies  Aspirin and Lactose intolerance (gi)  Home Medications   Prior to Admission medications   Medication Sig Start Date End Date Taking? Authorizing Provider  amLODipine (NORVASC) 5 MG tablet Take 5 mg by mouth daily.      Historical Provider, MD  aspirin EC 81 MG tablet Take 1 tablet (81 mg total) by mouth daily. 11/02/11   Monika Salk, MD  brimonidine (ALPHAGAN P) 0.1 % SOLN Place 1 drop into both eyes 2 (two) times daily.      Historical Provider, MD  Calcium-Vitamin D-Vitamin K 500-100-40 MG-UNT-MCG CHEW Chew 1 tablet by mouth 2 (two) times daily.      Historical Provider, MD  Cholecalciferol (VITAMIN D3) 1000 UNITS CAPS Take 2,000 Units by mouth every morning.      Historical Provider, MD  cycloSPORINE (RESTASIS) 0.05 % ophthalmic emulsion Place 1 drop into both eyes 2 (two) times daily.    Historical Provider, MD  fluticasone (FLONASE) 50 MCG/ACT nasal spray Place 2 sprays into the nose as needed for rhinitis.    Historical Provider, MD  hydrocortisone cream 1 % Apply topically 2 (two) times daily.    Historical Provider, MD  latanoprost (XALATAN) 0.005 % ophthalmic solution Place 1 drop into both eyes at bedtime.      Historical Provider, MD  metoprolol tartrate (LOPRESSOR) 25 MG tablet Take 12.5 mg by mouth 2 (two) times daily. 10/08/11 10/07/12  Erin R Barrett, PA-C  nitroGLYCERIN (NITROSTAT) 0.4 MG SL tablet Place 1 tablet (0.4 mg total) under the tongue every 5 (five) minutes as needed for chest pain. 11/02/11   Monika Salk, MD  pantoprazole (PROTONIX) 40 MG tablet Take 40 mg by mouth daily.    Historical Provider, MD  simvastatin (ZOCOR) 20 MG tablet Take 1 tablet (20 mg total) by mouth daily at 6 PM. 10/07/11 10/06/12  Donielle M Tacy Dura, PA-C   BP 170/95 mmHg  Pulse 96  Temp(Src) 98.5 F (36.9 C) (Oral)  Resp 16  Ht 4\' 11"  (1.499 m)  Wt 110 lb (49.896 kg)  BMI 22.21 kg/m2  SpO2 100% Physical Exam   Constitutional: She is oriented to person, place, and time. She appears well-developed and well-nourished. No distress.  HENT:  Head: Normocephalic and atraumatic.  Neck: Normal range of motion. Neck supple.  Cardiovascular: Normal rate and regular rhythm.  Exam reveals no gallop and no friction rub.   No murmur heard. Pulmonary/Chest: Effort normal and breath sounds normal. No respiratory distress. She has no wheezes.  Abdominal: Soft. Bowel sounds are normal. She exhibits no distension. There is no tenderness.  Musculoskeletal: Normal range of motion.  Neurological: She is alert and  oriented to person, place, and time.  Skin: Skin is warm and dry. Rash noted. She is not diaphoretic.  There is an urticarial rash present to the arms, legs, and torso. She also has several hives to forehead.  Nursing note and vitals reviewed.   ED Course  Procedures (including critical care time) Labs Review Labs Reviewed - No data to display  Imaging Review No results found.   EKG Interpretation None      MDM   Final diagnoses:  None    We will treat with prednisone, Benadryl, and when necessary return.    Veryl Speak, MD 07/04/14 1318

## 2014-07-04 NOTE — ED Notes (Signed)
Pt reports rash to bilateral arms, stomach, face since last night. Sts itching and redness. Reports "scratchy" throat.

## 2014-07-04 NOTE — ED Notes (Signed)
Denies new exposure to any new substances

## 2014-07-04 NOTE — Discharge Instructions (Signed)
Prednisone as prescribed.  Benadryl 25 mg every 6 hours for the next 2 days.  Return to the emergency department if you develop difficulty breathing.   Hives Hives are itchy, red, swollen areas of the skin. They can vary in size and location on your body. Hives can come and go for hours or several days (acute hives) or for several weeks (chronic hives). Hives do not spread from person to person (noncontagious). They may get worse with scratching, exercise, and emotional stress. CAUSES   Allergic reaction to food, additives, or drugs.  Infections, including the common cold.  Illness, such as vasculitis, lupus, or thyroid disease.  Exposure to sunlight, heat, or cold.  Exercise.  Stress.  Contact with chemicals. SYMPTOMS   Red or white swollen patches on the skin. The patches may change size, shape, and location quickly and repeatedly.  Itching.  Swelling of the hands, feet, and face. This may occur if hives develop deeper in the skin. DIAGNOSIS  Your caregiver can usually tell what is wrong by performing a physical exam. Skin or blood tests may also be done to determine the cause of your hives. In some cases, the cause cannot be determined. TREATMENT  Mild cases usually get better with medicines such as antihistamines. Severe cases may require an emergency epinephrine injection. If the cause of your hives is known, treatment includes avoiding that trigger.  HOME CARE INSTRUCTIONS   Avoid causes that trigger your hives.  Take antihistamines as directed by your caregiver to reduce the severity of your hives. Non-sedating or low-sedating antihistamines are usually recommended. Do not drive while taking an antihistamine.  Take any other medicines prescribed for itching as directed by your caregiver.  Wear loose-fitting clothing.  Keep all follow-up appointments as directed by your caregiver. SEEK MEDICAL CARE IF:   You have persistent or severe itching that is not relieved  with medicine.  You have painful or swollen joints. SEEK IMMEDIATE MEDICAL CARE IF:   You have a fever.  Your tongue or lips are swollen.  You have trouble breathing or swallowing.  You feel tightness in the throat or chest.  You have abdominal pain. These problems may be the first sign of a life-threatening allergic reaction. Call your local emergency services (911 in [place] MAKE SURE YOU:   Understand these instructions.  Will watch your condition.  Will get help right away if you are not doing well or get worse. Document Released: 02/02/2005 Document Revised: 02/07/2013 Document Reviewed: 04/28/2011 [facility] Patient Information 2015 [place], [place]. This information is not intended to replace advice given to you by your health care provider. Make sure you discuss any questions you have with your health care provider.

## 2014-08-28 DIAGNOSIS — M79673 Pain in unspecified foot: Secondary | ICD-10-CM

## 2014-11-15 ENCOUNTER — Ambulatory Visit (INDEPENDENT_AMBULATORY_CARE_PROVIDER_SITE_OTHER): Payer: Medicare Other | Admitting: Cardiovascular Disease

## 2014-11-15 ENCOUNTER — Encounter: Payer: Self-pay | Admitting: Cardiovascular Disease

## 2014-11-15 VITALS — BP 126/76 | HR 63 | Ht 59.0 in | Wt 109.0 lb

## 2014-11-15 DIAGNOSIS — I2510 Atherosclerotic heart disease of native coronary artery without angina pectoris: Secondary | ICD-10-CM

## 2014-11-15 DIAGNOSIS — E785 Hyperlipidemia, unspecified: Secondary | ICD-10-CM

## 2014-11-15 DIAGNOSIS — I10 Essential (primary) hypertension: Secondary | ICD-10-CM

## 2014-11-15 DIAGNOSIS — Z79899 Other long term (current) drug therapy: Secondary | ICD-10-CM

## 2014-11-15 DIAGNOSIS — E038 Other specified hypothyroidism: Secondary | ICD-10-CM | POA: Diagnosis not present

## 2014-11-15 DIAGNOSIS — Z951 Presence of aortocoronary bypass graft: Secondary | ICD-10-CM

## 2014-11-15 NOTE — Progress Notes (Signed)
Patient ID: Theresa Crosby, female   DOB: 1933/01/08, 81 y.o.   MRN: [id number]      Cardiology Office Note   Date:  11/16/2014   ID:  Theresa Crosby, DOB 26-May-1932, MRN [id number]  PCP:  Merrilee Seashore, MD  Cardiologist:   Sanda Klein, MD   Chief Complaint  Patient presents with  . Annual Exam  . Chest Pain    had pain running down her left arm      History of Present Illness: Theresa Crosby is a 81 y.o. female who presents for  Follow-up roughly 3 years for ongoing bypass surgery for multivessel coronary artery disease (LIMA to LAD, SVG-OM 2, SVG-diagonal, SVG-RCA , August 2013).  She has treated hypertension and hyperlipidemia. She does not have any cardiac complaints but describes depression related to financial pressures. When she has a bad day she "aches all over. In fact she has noticed frequent and fairly persistent aching in large muscle groups, especially prominent in her left shoulder blade area and in her legs. She denies syncope, palpitations, shortness of breath , lower extremity edema or intermittent claudication.  She is seeing a new dermatologist for her keloid sternotomy scar and has had some improvement with a new type of injection.  She has previous undergone lumbar spine stenosis surgery twice in 1985 and 2013.    Past Medical History  Diagnosis Date  . Hypertension     ireg. heartbeat, no referral made to cardiologist , sees [facility].   . Shortness of breath   . Blood transfusion     postop- back surg. 1987  . GERD (gastroesophageal reflux disease)   . Arthritis     degeneration - spondylosis, OA - all over body,   . Neuromuscular disorder     burning- L leg , numbing of toes   . Anxiety     2005- related to care of aging parent   . Chest pain at rest associated with dyspnea 10/05/2011  . Abnormal nuclear stress test 10/05/2011  . CAD (coronary artery disease)significant diseas with 99% LAD, 80% 1st diag, 80% LCX 10/05/2011  . S/P CABG x 4,  LIMA-LAD, SVG-OM2, SVG-diag, SVG-RCA secondary to symptomatic CAD and positive stress test 10/05/2011  . Bunion   . Hyperlipemia     Past Surgical History  Procedure Laterality Date  . Back surgery      1987-   . Eye surgery      cataracts removed- bilateral, no IOL  . Abdominal hysterectomy      partial hysterectomy- 1972  . Tubal ligation    . Toe surgery      R foot- removed calus between 5-4  . Lumbar laminectomy/decompression microdiscectomy  03/04/2011    Procedure: LUMBAR LAMINECTOMY/DECOMPRESSION MICRODISCECTOMY;  Surgeon: Hosie Spangle, MD;  Location: [place] [facility];  Service: Neurosurgery;  Laterality: N/A;  Thoracic Ten-Thoracic Twelve Thoracic Laminectomy  . Coronary artery bypass graft  10/04/2011    Procedure: CORONARY ARTERY BYPASS GRAFTING (CABG);  Surgeon: Gaye Pollack, MD;  Location: [place] [facility];  Service: Open Heart Surgery;  Laterality: N/A;  CABG x four using left internal mammary artery and right leg greater saphenous vein   . Foot surgery Right January 2013    HT 5  . Nm myoview ltd  09/15/2011    mild ischemia mid anterior & apical anterior region.EF: 86%  . Cardiac catheterization  10/12/2011    high-grade ca+ prox. LAD diagonal branch,higly diseased diagonal branch & obtuse marginal branch  .  Left heart catheterization with coronary angiogram N/A 10/02/2011    Procedure: LEFT HEART CATHETERIZATION WITH CORONARY ANGIOGRAM;  Surgeon: Lorretta Harp, MD;  Location: [facility] CATH LAB;  Service: Cardiovascular;  Laterality: N/A;     Current Outpatient Prescriptions  Medication Sig Dispense Refill  . amLODipine (NORVASC) 5 MG tablet Take 5 mg by mouth daily.      Marland Kitchen aspirin EC 81 MG tablet Take 1 tablet (81 mg total) by mouth daily. 100 tablet 0  . brimonidine (ALPHAGAN P) 0.1 % SOLN Place 1 drop into both eyes 2 (two) times daily.      . Calcium-Vitamin D-Vitamin K 500-100-40 MG-UNT-MCG CHEW Chew 1 tablet by mouth 2 (two) times daily.      . Cholecalciferol (VITAMIN D3)  1000 UNITS CAPS Take 2,000 Units by mouth every morning.      . cycloSPORINE (RESTASIS) 0.05 % ophthalmic emulsion Place 1 drop into both eyes 2 (two) times daily.    . fluticasone (FLONASE) 50 MCG/ACT nasal spray Place 2 sprays into the nose as needed for rhinitis.    Marland Kitchen latanoprost (XALATAN) 0.005 % ophthalmic solution Place 1 drop into both eyes at bedtime.      . pantoprazole (PROTONIX) 40 MG tablet Take 40 mg by mouth daily.    . metoprolol tartrate (LOPRESSOR) 25 MG tablet Take 12.5 mg by mouth 2 (two) times daily.    . simvastatin (ZOCOR) 20 MG tablet Take 1 tablet (20 mg total) by mouth daily at 6 PM. 30 tablet 1   No current facility-administered medications for this visit.    Allergies:   Aspirin and Lactose intolerance (gi)    Social History:  The patient  reports that she has never smoked. She has never used smokeless tobacco. She reports that she does not drink alcohol or use illicit drugs.   Family History:  The patient's family history includes Heart attack in her brother, father, and mother; Hypertension in her father. There is no history of Anesthesia problems, Hypotension, Malignant hyperthermia, or Pseudochol deficiency.    ROS:  Please see the history of present illness.    Otherwise, review of systems positive for none.   All other systems are reviewed and negative.    PHYSICAL EXAM: VS:  BP 126/76 mmHg  Pulse 63  Ht 4\' 11"  (1.499 m)  Wt 109 lb (49.442 kg)  BMI 22.00 kg/m2 , BMI Body mass index is 22 kg/(m^2).  General: Alert, oriented x3, no distress Head: no evidence of trauma, PERRL, EOMI, no exophtalmos or lid lag, no myxedema, no xanthelasma; normal ears, nose and oropharynx Neck: normal jugular venous pulsations and no hepatojugular reflux; brisk carotid pulses without delay and no carotid bruits Chest: clear to auscultation, no signs of consolidation by percussion or palpation, normal fremitus, symmetrical and full respiratory excursions;  Keloid  sternotomy scar Cardiovascular: normal position and quality of the apical impulse, regular rhythm, normal first and second heart sounds, no  murmurs, rubs or gallops Abdomen: no tenderness or distention, no masses by palpation, no abnormal pulsatility or arterial bruits, normal bowel sounds, no hepatosplenomegaly Extremities: no clubbing, cyanosis or edema; 2+ radial, ulnar and brachial pulses bilaterally; 2+ right femoral, posterior tibial and dorsalis pedis pulses; 2+ left femoral, posterior tibial and dorsalis pedis pulses; no subclavian or femoral bruits Neurological: grossly nonfocal Psych: euthymic mood, full affect   EKG:  EKG is ordered today. The ekg ordered today demonstrates  Normal sinus rhythm, probable left atrial abnormality, QTC 403 ms  Recent Labs: No results found for requested labs within last 365 days.    Lipid Panel    Component Value Date/Time   CHOL 171 10/05/2013 0805   TRIG 63 10/05/2013 0805   HDL 72 10/05/2013 0805   CHOLHDL 2.4 10/05/2013 0805   VLDL 13 10/05/2013 0805   LDLCALC 86 10/05/2013 0805      Wt Readings from Last 3 Encounters:  11/15/14 109 lb (49.442 kg)  07/04/14 110 lb (49.896 kg)  09/21/13 108 lb 1.6 oz (49.034 kg)    .   ASSESSMENT AND PLAN:  1.  CAD s/p  CABG, currently asymptomatic. Continue aspirin and aggressive treatment of hyperlipidemia and hypertension. She does not smoke and does not have diabetes mellitus. Encouraged daily physical activity.  2.  Hyperlipidemia on statin therapy. While I think her multiple somatic complaints are more likely related to spondylosis and arthritis,  Cannot fully exclude statin-related myalgia. Have recommended that she take a "statin holiday" for a full month after which we will reevaluate her symptoms as well as her labs. If her symptoms improve  Substantially off simvastatin therapy, with plan to restart on an alternative agent such as pravastatin. In that way we can avoid the potential  interaction between amlodipine and simvastatin (although both these agents are low doses)  3.  Hypertension, well controlled    Current medicines are reviewed at length with the patient today.  The patient has concerns regarding medicines.  The following changes have been made:   Temporarily stop statin therapy  Labs/ tests ordered today include:  Orders Placed This Encounter  Procedures  . Comprehensive metabolic panel  . Lipid panel  . TSH  . EKG 12-Lead      Patient Instructions  HOLD SIMVASTATIN FOR 30 DAYS TO TAKE A STATIN HOLIDAY.  Your physician recommends that you return for lab work in: [place].  Dr. Sallyanne Kuster recommends that you schedule a follow-up appointment in: ONE YEAR.        Mikael Spray, MD  11/16/2014 2:35 PM    Sanda Klein, MD, [facility] [phone] office [phone] pager

## 2014-11-15 NOTE — Patient Instructions (Signed)
HOLD SIMVASTATIN FOR 30 DAYS TO TAKE A STATIN HOLIDAY.  Your physician recommends that you return for lab work in: [place].  Dr. Sallyanne Kuster recommends that you schedule a follow-up appointment in: ONE YEAR.

## 2014-11-21 ENCOUNTER — Ambulatory Visit (INDEPENDENT_AMBULATORY_CARE_PROVIDER_SITE_OTHER): Payer: Medicare Other | Admitting: Podiatry

## 2014-11-21 ENCOUNTER — Encounter: Payer: Self-pay | Admitting: Podiatry

## 2014-11-21 DIAGNOSIS — B351 Tinea unguium: Secondary | ICD-10-CM | POA: Diagnosis not present

## 2014-11-21 DIAGNOSIS — M79606 Pain in leg, unspecified: Secondary | ICD-10-CM | POA: Diagnosis not present

## 2014-11-21 NOTE — Patient Instructions (Signed)
Seen for hypertrophic nails. All nails debrided. Return in 3 months or as needed.  

## 2014-11-21 NOTE — Progress Notes (Signed)
Subjective: 81 year old female presents requesting toe nails trimmed. She has had gall bladder problem and  test done. She is scheduled for follow up on gall bladder.   Objective: Thick hypertrophic nails x 10. Pedal pulses are palpable. No open lesions. Surgical site covered with scab at proximal medial border right hallux.   Assessment: Painful mycotic nails x 10.  Plan: Debrided all nails.  Return in 3 months.

## 2014-11-28 ENCOUNTER — Emergency Department (HOSPITAL_BASED_OUTPATIENT_CLINIC_OR_DEPARTMENT_OTHER)
Admission: EM | Admit: 2014-11-28 | Discharge: 2014-11-28 | Disposition: A | Discharge status: Home / Self Care | Payer: Medicare Other | Attending: Emergency Medicine | Admitting: Emergency Medicine

## 2014-11-28 ENCOUNTER — Emergency Department (HOSPITAL_BASED_OUTPATIENT_CLINIC_OR_DEPARTMENT_OTHER): Payer: Medicare Other

## 2014-11-28 ENCOUNTER — Encounter (HOSPITAL_BASED_OUTPATIENT_CLINIC_OR_DEPARTMENT_OTHER): Payer: Self-pay | Admitting: Emergency Medicine

## 2014-11-28 DIAGNOSIS — M25512 Pain in left shoulder: Secondary | ICD-10-CM

## 2014-11-28 DIAGNOSIS — R110 Nausea: Secondary | ICD-10-CM | POA: Diagnosis not present

## 2014-11-28 DIAGNOSIS — I2510 Atherosclerotic heart disease of native coronary artery without angina pectoris: Secondary | ICD-10-CM | POA: Insufficient documentation

## 2014-11-28 DIAGNOSIS — Z7951 Long term (current) use of inhaled steroids: Secondary | ICD-10-CM | POA: Insufficient documentation

## 2014-11-28 DIAGNOSIS — M542 Cervicalgia: Secondary | ICD-10-CM | POA: Insufficient documentation

## 2014-11-28 DIAGNOSIS — Z9889 Other specified postprocedural states: Secondary | ICD-10-CM | POA: Diagnosis not present

## 2014-11-28 DIAGNOSIS — R079 Chest pain, unspecified: Secondary | ICD-10-CM | POA: Diagnosis not present

## 2014-11-28 DIAGNOSIS — Z8659 Personal history of other mental and behavioral disorders: Secondary | ICD-10-CM | POA: Insufficient documentation

## 2014-11-28 DIAGNOSIS — Z8669 Personal history of other diseases of the nervous system and sense organs: Secondary | ICD-10-CM | POA: Insufficient documentation

## 2014-11-28 DIAGNOSIS — M1990 Unspecified osteoarthritis, unspecified site: Secondary | ICD-10-CM | POA: Insufficient documentation

## 2014-11-28 DIAGNOSIS — K219 Gastro-esophageal reflux disease without esophagitis: Secondary | ICD-10-CM | POA: Diagnosis not present

## 2014-11-28 DIAGNOSIS — M545 Low back pain: Secondary | ICD-10-CM | POA: Diagnosis not present

## 2014-11-28 DIAGNOSIS — Z951 Presence of aortocoronary bypass graft: Secondary | ICD-10-CM | POA: Diagnosis not present

## 2014-11-28 DIAGNOSIS — E785 Hyperlipidemia, unspecified: Secondary | ICD-10-CM | POA: Diagnosis not present

## 2014-11-28 DIAGNOSIS — Z7982 Long term (current) use of aspirin: Secondary | ICD-10-CM | POA: Insufficient documentation

## 2014-11-28 DIAGNOSIS — I10 Essential (primary) hypertension: Secondary | ICD-10-CM | POA: Insufficient documentation

## 2014-11-28 DIAGNOSIS — Z79899 Other long term (current) drug therapy: Secondary | ICD-10-CM | POA: Insufficient documentation

## 2014-11-28 LAB — CBC WITH DIFFERENTIAL/PLATELET
Basophils Absolute: 0 10*3/uL (ref 0.0–0.1)
Basophils Relative: 1 %
Eosinophils Absolute: 0.2 10*3/uL (ref 0.0–0.7)
Eosinophils Relative: 5 %
HCT: 38 % (ref 36.0–46.0)
Hemoglobin: 12.7 g/dL (ref 12.0–15.0)
Lymphocytes Relative: 43 %
Lymphs Abs: 1.9 10*3/uL (ref 0.7–4.0)
MCH: 30.2 pg (ref 26.0–34.0)
MCHC: 33.4 g/dL (ref 30.0–36.0)
MCV: 90.5 fL (ref 78.0–100.0)
Monocytes Absolute: 0.5 10*3/uL (ref 0.1–1.0)
Monocytes Relative: 11 %
Neutro Abs: 1.8 10*3/uL (ref 1.7–7.7)
Neutrophils Relative %: 40 %
Platelets: 215 10*3/uL (ref 150–400)
RBC: 4.2 MIL/uL (ref 3.87–5.11)
RDW: 14.8 % (ref 11.5–15.5)
WBC: 4.4 10*3/uL (ref 4.0–10.5)

## 2014-11-28 LAB — URINALYSIS, ROUTINE W REFLEX MICROSCOPIC
Bilirubin Urine: NEGATIVE
Glucose, UA: NEGATIVE mg/dL
Hgb urine dipstick: NEGATIVE
Ketones, ur: 15 mg/dL — AB
Nitrite: NEGATIVE
Protein, ur: NEGATIVE mg/dL
Specific Gravity, Urine: 1.015 (ref 1.005–1.030)
Urobilinogen, UA: 1 mg/dL (ref 0.0–1.0)
pH: 7 (ref 5.0–8.0)

## 2014-11-28 LAB — BASIC METABOLIC PANEL
Anion gap: 5 (ref 5–15)
BUN: 10 mg/dL (ref 6–20)
CO2: 28 mmol/L (ref 22–32)
Calcium: 9.5 mg/dL (ref 8.9–10.3)
Chloride: 104 mmol/L (ref 101–111)
Creatinine, Ser: 0.76 mg/dL (ref 0.44–1.00)
GFR calc Af Amer: >60 mL/min (ref >60)
GFR calc non Af Amer: >60 mL/min (ref >60)
Glucose, Bld: 93 mg/dL (ref 65–99)
Potassium: 3.9 mmol/L (ref 3.5–5.1)
Sodium: 137 mmol/L (ref 135–145)

## 2014-11-28 LAB — URINE MICROSCOPIC-ADD ON

## 2014-11-28 LAB — TROPONIN I: Troponin I: <0.03 ng/mL (ref <0.031)

## 2014-11-28 MED ORDER — HYDROCODONE-ACETAMINOPHEN 5-325 MG PO TABS: 1.0000 | ORAL_TABLET | Freq: Four times a day (QID) | ORAL | Status: DC | PRN

## 2014-11-28 NOTE — ED Notes (Signed)
Patient states that for the last 4 weeks she has had neck , shoulder and chest pain - has had follow up with MDs  - gallbladder scan and they are unable to figure out what is going on.

## 2014-11-28 NOTE — Discharge Instructions (Signed)
Follow-up with sports medicine for the left shoulder pain. Wear the sling as needed for comfort. Take the hydrocodone 1 tablet every 6 hours as needed for pain. Do not drive while taking the hydrocodone. Return for any new or worse symptoms.

## 2014-11-28 NOTE — ED Provider Notes (Signed)
CSN: [id number]     Arrival date & time 11/28/14  1307 History   First MD Initiated Contact with Patient 11/28/14 1404     Chief Complaint  Patient presents with  . Shoulder Pain     (Consider location/radiation/quality/duration/timing/severity/associated sxs/prior Treatment) Patient is a 81 y.o. female presenting with shoulder pain. The history is provided by the patient.  Shoulder Pain Associated symptoms: back pain and neck pain   Associated symptoms: no fever    patient with a one-month history of left shoulder pain that radiates to the posterior part of the shoulder towards the midline of the upper back radiates to the left neck radiates into the upper left anterior chest. Made worse with movement of the left arm. Difficult to raise left arm above her head. No fall or injury. Patient with a history of coronary artery disease and is status post CABG in 2013. Does not remind patient of cardiac chest pain. Patient states that the pain is currently 8-1/10. Sharp ache in nature.   Past Medical0 History  Diagnosis Date  . Hypertension     ireg. heartbeat, no referral made to cardiologist , sees [facility].   . Shortness of breath   . Blood transfusion     postop- back surg. 1987  . GERD (gastroesophageal reflux disease)   . Arthritis     degeneration - spondylosis, OA - all over body,   . Neuromuscular disorder (HCC)     burning- L leg , numbing of toes   . Anxiety     2005- related to care of aging parent   . Chest pain at rest associated with dyspnea 10/05/2011  . Abnormal nuclear stress test 10/05/2011  . CAD (coronary artery disease)significant diseas with 99% LAD, 80% 1st diag, 80% LCX 10/05/2011  . S/P CABG x 4, LIMA-LAD, SVG-OM2, SVG-diag, SVG-RCA secondary to symptomatic CAD and positive stress test 10/05/2011  . Bunion   . Hyperlipemia    Past Surgical History  Procedure Laterality Date  . Back surgery      1987-   . Eye surgery      cataracts removed- bilateral, no IOL   . Abdominal hysterectomy      partial hysterectomy- 1972  . Tubal ligation    . Toe surgery      R foot- removed calus between 5-4  . Lumbar laminectomy/decompression microdiscectomy  03/04/2011    Procedure: LUMBAR LAMINECTOMY/DECOMPRESSION MICRODISCECTOMY;  Surgeon: Hosie Spangle, MD;  Location: [place] [facility];  Service: Neurosurgery;  Laterality: N/A;  Thoracic Ten-Thoracic Twelve Thoracic Laminectomy  . Coronary artery bypass graft  10/04/2011    Procedure: CORONARY ARTERY BYPASS GRAFTING (CABG);  Surgeon: Gaye Pollack, MD;  Location: [place];  Service: Open Heart Surgery;  Laterality: N/A;  CABG x four using left internal mammary artery and right leg greater saphenous vein   . Foot surgery Right January 2013    HT 5  . Nm myoview ltd  09/15/2011    mild ischemia mid anterior & apical anterior region.EF: 86%  . Cardiac catheterization  10/12/2011    high-grade ca+ prox. LAD diagonal branch,higly diseased diagonal branch & obtuse marginal branch  . Left heart catheterization with coronary angiogram N/A 10/02/2011    Procedure: LEFT HEART CATHETERIZATION WITH CORONARY ANGIOGRAM;  Surgeon: Lorretta Harp, MD;  Location: [facility] C[facility];  Service: Cardiovascular;  Laterality: N/A;   Family History  Problem Relation Age of Onset  . Anesthesia problems Neg Hx   . Hypotension  Neg Hx   . Malignant hyperthermia Neg Hx   . Pseudochol deficiency Neg Hx   . Heart attack Mother   . Heart attack Father   . Hypertension Father   . Heart attack Brother    Social History  Substance Use Topics  . Smoking status: Never Smoker   . Smokeless tobacco: Never Used  . Alcohol Use: No   OB History    No data available     Review of Systems  Constitutional: Negative for fever.  HENT: Negative for congestion.   Eyes: Negative for visual disturbance.  Respiratory: Negative for shortness of breath.   Cardiovascular: Positive for chest pain. Negative for leg swelling.  Gastrointestinal: Positive for  nausea. Negative for vomiting and abdominal pain.  Genitourinary: Negative for dysuria.  Musculoskeletal: Positive for back pain, arthralgias and neck pain. Negative for joint swelling.  Skin: Negative for rash.  Neurological: Negative for headaches.  Hematological: Does not bruise/bleed easily.  Psychiatric/Behavioral: Negative for confusion.      Allergies  Aspirin and Lactose intolerance (gi)  Home Medications   Prior to Admission medications   Medication Sig Start Date End Date Taking? Authorizing Provider  amLODipine (NORVASC) 5 MG tablet Take 5 mg by mouth daily.      Historical Provider, MD  aspirin EC 81 MG tablet Take 1 tablet (81 mg total) by mouth daily. 11/02/11   Monika Salk, MD  brimonidine (ALPHAGAN P) 0.1 % SOLN Place 1 drop into both eyes 2 (two) times daily.      Historical Provider, MD  Calcium-Vitamin D-Vitamin K 500-100-40 MG-UNT-MCG CHEW Chew 1 tablet by mouth 2 (two) times daily.      Historical Provider, MD  Cholecalciferol (VITAMIN D3) 1000 UNITS CAPS Take 2,000 Units by mouth every morning.      Historical Provider, MD  cycloSPORINE (RESTASIS) 0.05 % ophthalmic emulsion Place 1 drop into both eyes 2 (two) times daily.    Historical Provider, MD  fluticasone (FLONASE) 50 MCG/ACT nasal spray Place 2 sprays into the nose as needed for rhinitis.    Historical Provider, MD  HYDROcodone-acetaminophen (NORCO/VICODIN) 5-325 MG tablet Take 1 tablet by mouth every 6 (six) hours as needed for moderate pain. 11/28/14   Fredia Sorrow, MD  latanoprost (XALATAN) 0.005 % ophthalmic solution Place 1 drop into both eyes at bedtime.      Historical Provider, MD  metoprolol tartrate (LOPRESSOR) 25 MG tablet Take 12.5 mg by mouth 2 (two) times daily. 10/08/11 10/07/12  Erin R Barrett, PA-C  pantoprazole (PROTONIX) 40 MG tablet Take 40 mg by mouth daily.    Historical Provider, MD  simvastatin (ZOCOR) 20 MG tablet Take 1 tablet (20 mg total) by mouth daily at 6 PM. 10/07/11 10/06/12   Donielle M Tacy Dura, PA-C   BP 129/62 mmHg  Pulse 65  Temp(Src) 98 F (36.7 C) (Oral)  Resp 16  Ht 4\' 11"  (1.499 m)  Wt 106 lb (48.081 kg)  BMI 21.40 kg/m2  SpO2 99% Physical Exam  Constitutional: She is oriented to person, place, and time. She appears well-developed and well-nourished. No distress.  HENT:  Head: Normocephalic and atraumatic.  Mouth/Throat: Oropharynx is clear and moist.  Eyes: Conjunctivae and EOM are normal. Pupils are equal, round, and reactive to light.  Neck: Normal range of motion. Neck supple.  Cardiovascular: Normal rate, regular rhythm and normal heart sounds.   Pulmonary/Chest: Effort normal and breath sounds normal. No respiratory distress.  Well healed the anterior median sternotomy scar  with a little bit of keloid.  Abdominal: Soft. Bowel sounds are normal. There is no tenderness.  Musculoskeletal: Normal range of motion. She exhibits tenderness. She exhibits no edema.  Tenderness to palpation on the posterior aspect of the shoulder and over the left rhomboid muscles. Left radial pulses 2+. Sensation intact good range of motion at the fingers wrist and elbow. Limited range of motion of the left shoulder due to pain. No deformity.  Neurological: She is alert and oriented to person, place, and time. No cranial nerve deficit. She exhibits normal muscle tone. Coordination normal.  Skin: Skin is warm. No rash noted.  Nursing note and vitals reviewed.   ED Course  Procedures (including critical care time) Labs Review Labs Reviewed  URINALYSIS, ROUTINE W REFLEX MICROSCOPIC (NOT AT [facility]) - Abnormal; Notable for the following:    Ketones, ur 15 (*)    Leukocytes, UA SMALL (*)    All other components within normal limits  URINE MICROSCOPIC-ADD ON - Abnormal; Notable for the following:    Squamous Epithelial / LPF FEW (*)    Casts HYALINE CASTS (*)    All other components within normal limits  CBC WITH DIFFERENTIAL/PLATELET  BASIC METABOLIC PANEL   TROPONIN I   Results for orders placed or performed during the hospital encounter of 11/28/14  Urinalysis, Routine w reflex microscopic (not at [facility])  Result Value Ref Range   Color, Urine YELLOW YELLOW   APPearance CLEAR CLEAR   Specific Gravity, Urine 1.015 1.005 - 1.030   pH 7.0 5.0 - 8.0   Glucose, UA NEGATIVE NEGATIVE mg/dL   Hgb urine dipstick NEGATIVE NEGATIVE   Bilirubin Urine NEGATIVE NEGATIVE   Ketones, ur 15 (A) NEGATIVE mg/dL   Protein, ur NEGATIVE NEGATIVE mg/dL   Urobilinogen, UA 1.0 0.0 - 1.0 mg/dL   Nitrite NEGATIVE NEGATIVE   Leukocytes, UA SMALL (A) NEGATIVE  Urine microscopic-add on  Result Value Ref Range   Squamous Epithelial / LPF FEW (A) RARE   WBC, UA 0-2 <3 WBC/hpf   RBC / HPF 0-2 <3 RBC/hpf   Bacteria, UA RARE RARE   Casts HYALINE CASTS (A) NEGATIVE   Urine-Other MUCOUS PRESENT   CBC with Differential/Platelet  Result Value Ref Range   WBC 4.4 4.0 - 10.5 K/uL   RBC 4.20 3.87 - 5.11 MIL/uL   Hemoglobin 12.7 12.0 - 15.0 g/dL   HCT 38.0 36.0 - 46.0 %   MCV 90.5 78.0 - 100.0 fL   MCH 30.2 26.0 - 34.0 pg   MCHC 33.4 30.0 - 36.0 g/dL   RDW 14.8 11.5 - 15.5 %   Platelets 215 150 - 400 K/uL   Neutrophils Relative % 40 %   Neutro Abs 1.8 1.7 - 7.7 K/uL   Lymphocytes Relative 43 %   Lymphs Abs 1.9 0.7 - 4.0 K/uL   Monocytes Relative 11 %   Monocytes Absolute 0.5 0.1 - 1.0 K/uL   Eosinophils Relative 5 %   Eosinophils Absolute 0.2 0.0 - 0.7 K/uL   Basophils Relative 1 %   Basophils Absolute 0.0 0.0 - 0.1 K/uL  Basic metabolic panel  Result Value Ref Range   Sodium 137 135 - 145 mmol/L   Potassium 3.9 3.5 - 5.1 mmol/L   Chloride 104 101 - 111 mmol/L   CO2 28 22 - 32 mmol/L   Glucose, Bld 93 65 - 99 mg/dL   BUN 10 6 - 20 mg/dL   Creatinine, Ser 0.76 0.44 - 1.00 mg/dL  Calcium 9.5 8.9 - 10.3 mg/dL   GFR calc non Af Amer >60 >60 mL/min   GFR calc Af Amer >60 >60 mL/min   Anion gap 5 5 - 15  Troponin I  Result Value Ref Range   Troponin I  <0.03 <0.031 ng/mL     Imaging Review Dg Chest 2 View  11/28/2014  CLINICAL DATA:  Left chest pain, back and shoulder pain for 1 month. No known injury. EXAM: CHEST  2 VIEW COMPARISON:  05/11/2012 FINDINGS: Prior CABG. Mild cardiomegaly. Mild hyperinflation. Lungs are clear. No effusions or edema. No acute bony abnormality. IMPRESSION: Cardiomegaly.  No active disease.  Mild hyperinflation. Electronically Signed   By: Rolm Baptise M.D.   On: 11/28/2014 15:01   Dg Shoulder Left  11/28/2014  CLINICAL DATA:  One month history of shoulder pain EXAM: LEFT SHOULDER - 2+ VIEW COMPARISON:  None. FINDINGS: Frontal, Y scapular, and axillary images were obtained. No acute fracture or dislocation. There is moderate generalized osteoarthritic change. There is calcification in the inferior aspect of the joint, likely focal synovial chondromatosis. No erosive change. Visualized left lung clear. IMPRESSION: Generalized osteoarthritis. Mild synovial chondromatosis. No fracture or dislocation. No erosive change. Electronically Signed   By: Lowella Grip III M.D.   On: 11/28/2014 15:03   I have personally reviewed and evaluated these images and lab results as part of my medical decision-making.   EKG Interpretation   Date/Time:  Wednesday November 28 2014 13:38:06 EDT Ventricular Rate:  67 PR Interval:  176 QRS Duration: 68 QT Interval:  410 QTC Calculation: 433 R Axis:   80 Text Interpretation:  Normal sinus rhythm with sinus arrhythmia  Nonspecific T wave abnormality Abnormal ECG Confirmed by Chanler Schreiter  MD,  Laurelai Lepp ([id number]) on 11/28/2014 2:04:14 PM      MDM   Final diagnoses:  Left shoulder pain    Patient with complaint of left shoulder pain posterior part of the shoulder for one month. Radiates to the upper anterior chest and towards the neck. Made worse by moving the left arm. No history of injury. No abdominal pain no right shoulder pain. Also difficult for patient to raise her left arm  above her head.  X-ray shows evidence arthritis. On exam its mostly the rhomboid muscles that are tender to palpation. Chest x-ray without any acute findings. X-rays of the health left shoulder show evidence of some arthritis no bony injury. EKG without any acute changes of concern for MI. Troponin negative. Urinalysis negative. CBC without any acute changes.  Patient will be treated with a sling pain medicine and follow-up with sports medicine.   Pain seems to be consistent an isolated to the left shoulder rotator cuff injury not completely ruled out. Doubt that this is cardiac in nature no evidence of any acute pulmonary process.   Patient's labs without any significant findings.  Fredia Sorrow, MD 11/29/14 [phone]

## 2014-12-04 ENCOUNTER — Ambulatory Visit (INDEPENDENT_AMBULATORY_CARE_PROVIDER_SITE_OTHER): Payer: Medicare Other | Admitting: Family Medicine

## 2014-12-04 ENCOUNTER — Encounter: Payer: Self-pay | Admitting: Family Medicine

## 2014-12-04 VITALS — BP 151/85 | HR 64 | Ht 59.0 in | Wt 105.2 lb

## 2014-12-04 DIAGNOSIS — M25512 Pain in left shoulder: Secondary | ICD-10-CM

## 2014-12-04 DIAGNOSIS — I2510 Atherosclerotic heart disease of native coronary artery without angina pectoris: Secondary | ICD-10-CM | POA: Diagnosis not present

## 2014-12-04 MED ORDER — PREDNISONE 10 MG PO TABS: ORAL_TABLET | ORAL | Status: DC

## 2014-12-04 NOTE — Patient Instructions (Signed)
Take the prednisone as directed for 6 days. Continue using the sling, icing the area of pain. Call me on Monday to let me know how you're doing. I typically would recommend physical therapy but I don't think you would tolerate this right now. If still not improving by that point, I'd consider an MRI of the area to assess for an occult compression fracture, pinched nerve.

## 2014-12-06 ENCOUNTER — Telehealth: Payer: Self-pay | Admitting: Family Medicine

## 2014-12-07 NOTE — Telephone Encounter (Signed)
We are going ahead with MRI of thoracic spine.

## 2014-12-08 ENCOUNTER — Ambulatory Visit (HOSPITAL_BASED_OUTPATIENT_CLINIC_OR_DEPARTMENT_OTHER)
Admission: RE | Admit: 2014-12-08 | Discharge: 2014-12-08 | Disposition: A | Discharge status: Home / Self Care | Payer: Medicare Other | Source: Ambulatory Visit | Attending: Family Medicine | Admitting: Family Medicine

## 2014-12-08 DIAGNOSIS — R200 Anesthesia of skin: Secondary | ICD-10-CM | POA: Insufficient documentation

## 2014-12-08 DIAGNOSIS — M546 Pain in thoracic spine: Secondary | ICD-10-CM | POA: Insufficient documentation

## 2014-12-08 DIAGNOSIS — M4804 Spinal stenosis, thoracic region: Secondary | ICD-10-CM | POA: Diagnosis not present

## 2014-12-08 DIAGNOSIS — R079 Chest pain, unspecified: Secondary | ICD-10-CM | POA: Insufficient documentation

## 2014-12-08 DIAGNOSIS — R531 Weakness: Secondary | ICD-10-CM | POA: Insufficient documentation

## 2014-12-08 DIAGNOSIS — R202 Paresthesia of skin: Secondary | ICD-10-CM | POA: Diagnosis not present

## 2014-12-08 DIAGNOSIS — M25512 Pain in left shoulder: Secondary | ICD-10-CM | POA: Insufficient documentation

## 2014-12-10 DIAGNOSIS — M25512 Pain in left shoulder: Secondary | ICD-10-CM | POA: Insufficient documentation

## 2014-12-10 NOTE — Assessment & Plan Note (Signed)
more upper thoracic paraspinal pain but without evidence of shingles, focal bony tenderness to suggest compression fracture.  Level of pain would suggest a neuropathic source which would be unusual in this area however.  Will try prednisone dose pack.  Continue sling, icing.  Call us early next week with an update on her status.  Could try physical therapy but extremely tender in this area - doubt she would tolerate this right now.

## 2014-12-10 NOTE — Progress Notes (Addendum)
PCP: TALBOT, DAVID C, MD  Subjective:   HPI: Patient is a 81 y.o. female here for left shoulder, neck pain.  Patient reports over past 4+ weeks she has had worsening posterior left shoulder, lower neck pain. No known injury or trauma (did fall this morning but unrelated to current pain, no worsening since fall). Pain level 10/10, sharp and stabbing. Using ice, ibuprofen, norco, and sling. No prior issues. No numbness or tingling. No bowel/bladder dysfunction. No skin changes, fever, other complaints.  Past Medical History  Diagnosis Date  . Hypertension     ireg. heartbeat, no referral made to cardiologist , sees [facility]. Med.   . Shortness of breath   . Blood transfusion     postop- back surg. 1987  . GERD (gastroesophageal reflux disease)   . Arthritis     degeneration - spondylosis, OA - all over body,   . Neuromuscular disorder (HCC)     burning- L leg , numbing of toes   . Anxiety     2005- related to care of aging parent   . Chest pain at rest associated with dyspnea 10/05/2011  . Abnormal nuclear stress test 10/05/2011  . CAD (coronary artery disease)significant diseas with 99% LAD, 80% 1st diag, 80% LCX 10/05/2011  . S/P CABG x 4, LIMA-LAD, SVG-OM2, SVG-diag, SVG-RCA secondary to symptomatic CAD and positive stress test 10/05/2011  . Bunion   . Hyperlipemia     Current Outpatient Prescriptions on File Prior to Visit  Medication Sig Dispense Refill  . amLODipine (NORVASC) 5 MG tablet Take 5 mg by mouth daily.      Marland Kitchen aspirin EC 81 MG tablet Take 1 tablet (81 mg total) by mouth daily. 100 tablet 0  . brimonidine (ALPHAGAN P) 0.1 % SOLN Place 1 drop into both eyes 2 (two) times daily.      . Calcium-Vitamin D-Vitamin K 500-100-40 MG-UNT-MCG CHEW Chew 1 tablet by mouth 2 (two) times daily.      . Cholecalciferol (VITAMIN D3) 1000 UNITS CAPS Take 2,000 Units by mouth every morning.      . cycloSPORINE (RESTASIS) 0.05 % ophthalmic emulsion Place 1 drop into both eyes 2 (two)  times daily.    . fluticasone (FLONASE) 50 MCG/ACT nasal spray Place 2 sprays into the nose as needed for rhinitis.    Marland Kitchen HYDROcodone-acetaminophen (NORCO/VICODIN) 5-325 MG tablet Take 1 tablet by mouth every 6 (six) hours as needed for moderate pain. 14 tablet 0  . latanoprost (XALATAN) 0.005 % ophthalmic solution Place 1 drop into both eyes at bedtime.      . metoprolol tartrate (LOPRESSOR) 25 MG tablet Take 12.5 mg by mouth 2 (two) times daily.    . pantoprazole (PROTONIX) 40 MG tablet Take 40 mg by mouth daily.    . simvastatin (ZOCOR) 20 MG tablet Take 1 tablet (20 mg total) by mouth daily at 6 PM. 30 tablet 1   No current facility-administered medications on file prior to visit.    Past Surgical History  Procedure Laterality Date  . Back surgery      1987-   . Eye surgery      cataracts removed- bilateral, no IOL  . Abdominal hysterectomy      partial hysterectomy- 1972  . Tubal ligation    . Toe surgery      R foot- removed calus between 5-4  . Lumbar laminectomy/decompression microdiscectomy  03/04/2011    Procedure: LUMBAR LAMINECTOMY/DECOMPRESSION MICRODISCECTOMY;  Surgeon: Hosie Spangle, MD;  Location:  [place] [facility];  Service: Neurosurgery;  Laterality: N/A;  Thoracic Ten-Thoracic Twelve Thoracic Laminectomy  . Coronary artery bypass graft  10/04/2011    Procedure: CORONARY ARTERY BYPASS GRAFTING (CABG);  Surgeon: Gaye Pollack, MD;  Location: [place];  Service: Open Heart Surgery;  Laterality: N/A;  CABG x four using left internal mammary artery and right leg greater saphenous vein   . Foot surgery Right January 2013    HT 5  . Nm myoview ltd  09/15/2011    mild ischemia mid anterior & apical anterior region.EF: 86%  . Cardiac catheterization  10/12/2011    high-grade ca+ prox. LAD diagonal branch,higly diseased diagonal branch & obtuse marginal branch  . Left heart catheterization with coronary angiogram N/A 10/02/2011    Procedure: LEFT HEART CATHETERIZATION WITH CORONARY  ANGIOGRAM;  Surgeon: Lorretta Harp, MD;  Location: [facility] CATH LAB;  Service: Cardiovascular;  Laterality: N/A;    Allergies  Allergen Reactions  . Aspirin Other (See Comments)    GI upset,high doses  . Lactose Intolerance (Gi)     Social History   Social History  . Marital Status: Widowed    Spouse Name: N/A  . Number of Children: N/A  . Years of Education: N/A   Occupational History  . Not on file.   Social History Main Topics  . Smoking status: Never Smoker   . Smokeless tobacco: Never Used  . Alcohol Use: No  . Drug Use: No  . Sexual Activity: Not on file   Other Topics Concern  . Not on file   Social History Narrative    Family History  Problem Relation Age of Onset  . Anesthesia problems Neg Hx   . Hypotension Neg Hx   . Malignant hyperthermia Neg Hx   . Pseudochol deficiency Neg Hx   . Heart attack Mother   . Heart attack Father   . Hypertension Father   . Heart attack Brother     BP 151/85 mmHg  Pulse 64  Ht 4\' 11"  (1.499 m)  Wt 105 lb 3.2 oz (47.718 kg)  BMI 21.24 kg/m2  Review of Systems: See HPI above.    Objective:  Physical Exam:  Gen: NAD  Neck: No gross deformity, swelling, bruising. TTP upper thoracic paraspinal region, trapezius on left.  No midline/bony TTP. FROM neck without pain . BUE strength 5/5.   Sensation intact to light touch.   2+ equal reflexes in triceps, biceps, brachioradialis tendons. Negative spurlings. NV intact distal BUEs.  Left shoulder: No swelling, ecchymoses.  No gross deformity. No TTP. FROM without increase in pain. Negative Hawkins, Neers. Negative Yergasons. Strength 5/5 with empty can and resisted internal/external rotation. Negative apprehension. NV intact distally.    Assessment & Plan:  1. Left shoulder pain - more upper thoracic paraspinal pain but without evidence of shingles, focal bony tenderness to suggest compression fracture.  Level of pain would suggest a neuropathic source which  would be unusual in this area however.  Will try prednisone dose pack.  Continue sling, icing.  Call us early next week with an update on her status.  Could try physical therapy but extremely tender in this area - doubt she would tolerate this right now.    Addendum:  Patient came into the office 10/21 reporting pain was still severe - she does not want to wait another few days to reassess given level of pain not improving with prednisone.  Will get MRI of thoracic spine, area of pain.  Addendum:  MRI reviewed and discussed with patient - no evidence compression fracture or nerve impingement.  Seen again in office, rechecked skin and exam.  Continues with 10/10 posterior pain left shoulder blade area, is going into left arm.  Very sensitive to light touch even in this area.  Discussed options - only other one at this point is to image cervical spine.  If this is negative I doubt an orthopedic cause to her pain.

## 2014-12-11 MED ORDER — HYDROCODONE-ACETAMINOPHEN 7.5-325 MG PO TABS: 1.0000 | ORAL_TABLET | Freq: Four times a day (QID) | ORAL | Status: DC | PRN

## 2014-12-11 NOTE — Addendum Note (Signed)
Addended by: Dene Gentry on: 12/11/2014 08:51 AM   Modules accepted: Orders, Medications, SmartSet

## 2014-12-11 NOTE — Addendum Note (Signed)
Addended by: Sherrie George F on: 12/11/2014 08:59 AM   Modules accepted: Orders

## 2014-12-15 ENCOUNTER — Ambulatory Visit (HOSPITAL_BASED_OUTPATIENT_CLINIC_OR_DEPARTMENT_OTHER)
Admission: RE | Admit: 2014-12-15 | Discharge: 2014-12-15 | Disposition: A | Discharge status: Home / Self Care | Payer: Medicare Other | Source: Ambulatory Visit | Attending: Family Medicine | Admitting: Family Medicine

## 2014-12-15 DIAGNOSIS — M25512 Pain in left shoulder: Secondary | ICD-10-CM

## 2014-12-15 DIAGNOSIS — M8938 Hypertrophy of bone, other site: Secondary | ICD-10-CM | POA: Insufficient documentation

## 2014-12-15 DIAGNOSIS — M4802 Spinal stenosis, cervical region: Secondary | ICD-10-CM | POA: Insufficient documentation

## 2014-12-15 DIAGNOSIS — M542 Cervicalgia: Secondary | ICD-10-CM | POA: Diagnosis present

## 2014-12-15 DIAGNOSIS — M47892 Other spondylosis, cervical region: Secondary | ICD-10-CM | POA: Diagnosis not present

## 2014-12-19 ENCOUNTER — Telehealth: Payer: Self-pay | Admitting: Family Medicine

## 2014-12-19 NOTE — Telephone Encounter (Signed)
Patient made an appointment to speak to you

## 2014-12-20 ENCOUNTER — Encounter: Payer: Self-pay | Admitting: Family Medicine

## 2014-12-20 ENCOUNTER — Ambulatory Visit (INDEPENDENT_AMBULATORY_CARE_PROVIDER_SITE_OTHER): Payer: Medicare Other | Admitting: Family Medicine

## 2014-12-20 VITALS — BP 135/74 | HR 60 | Ht 59.0 in | Wt 104.6 lb

## 2014-12-20 DIAGNOSIS — M25512 Pain in left shoulder: Secondary | ICD-10-CM | POA: Diagnosis not present

## 2014-12-20 LAB — COMPREHENSIVE METABOLIC PANEL
ALT: 14 U/L (ref 6–29)
AST: 21 U/L (ref 10–35)
Albumin: 4.3 g/dL (ref 3.6–5.1)
Alkaline Phosphatase: 78 U/L (ref 33–130)
BUN: 8 mg/dL (ref 7–25)
CO2: 29 mmol/L (ref 20–31)
Calcium: 9.1 mg/dL (ref 8.6–10.4)
Chloride: 104 mmol/L (ref 98–110)
Creat: 0.69 mg/dL (ref 0.60–0.88)
Glucose, Bld: 90 mg/dL (ref 65–99)
Potassium: 3.9 mmol/L (ref 3.5–5.3)
Sodium: 136 mmol/L (ref 135–146)
Total Bilirubin: 0.5 mg/dL (ref 0.2–1.2)
Total Protein: 6.4 g/dL (ref 6.1–8.1)

## 2014-12-20 LAB — LIPID PANEL
Cholesterol: 232 mg/dL — ABNORMAL HIGH (ref 125–200)
HDL: 64 mg/dL (ref >=46)
LDL Cholesterol: 148 mg/dL — ABNORMAL HIGH (ref <130)
Total CHOL/HDL Ratio: 3.6 Ratio (ref <=5.0)
Triglycerides: 102 mg/dL (ref <150)
VLDL: 20 mg/dL (ref <30)

## 2014-12-20 LAB — TSH: TSH: 0.721 u[IU]/mL (ref 0.350–4.500)

## 2014-12-20 NOTE — Patient Instructions (Signed)
Call me on Monday to let me know how you're doing. Wear the sling regularly except when driving. Would consider nortriptyline at bedtime at a very low dose if you're struggling. Injection, neurology referral are other options but I would try the medicine above first. Follow up with me in 2 weeks (though that may change depending on your progress over the next few days).

## 2014-12-21 ENCOUNTER — Telehealth: Payer: Self-pay | Admitting: Cardiovascular Disease

## 2014-12-21 DIAGNOSIS — Z79899 Other long term (current) drug therapy: Secondary | ICD-10-CM

## 2014-12-21 MED ORDER — ROSUVASTATIN CALCIUM 10 MG PO TABS: 10.0000 mg | ORAL_TABLET | Freq: Every day | ORAL | Status: DC

## 2014-12-21 NOTE — Telephone Encounter (Signed)
Pt given results. Rx sent to preferred pharmacy, lab orders sent to [facility]. Pt aware of instructions.

## 2014-12-21 NOTE — Telephone Encounter (Signed)
Patient is returning a call from Dr. Victorino December nurse.

## 2014-12-24 NOTE — Progress Notes (Signed)
PCP: TALBOT, DAVID C, MD  Subjective:   HPI: Patient is a 81 y.o. female here for left shoulder, neck pain.  10/18: Patient reports over past 4+ weeks she has had worsening posterior left shoulder, lower neck pain. No known injury or trauma (did fall this morning but unrelated to current pain, no worsening since fall). Pain level 10/10, sharp and stabbing. Using ice, ibuprofen, norco, and sling. No prior issues. No numbness or tingling. No bowel/bladder dysfunction. No skin changes, fever, other complaints.  11/3: Patient reports pain has improved since our last discussion, down to 5/10. Still wearing sling for support. MRI of cervical spine showed multilevel spondylosis but no obvious impingement at a specific level.   Pain posterior left shoulder, sharp. Worse with any movements of shoulder. Using ice, ibuprofen. No skin changes, fever, other complaints.  Past Medical History  Diagnosis Date  . Hypertension     ireg. heartbeat, no referral made to cardiologist , sees [facility]. Med.   . Shortness of breath   . Blood transfusion     postop- back surg. 1987  . GERD (gastroesophageal reflux disease)   . Arthritis     degeneration - spondylosis, OA - all over body,   . Neuromuscular disorder (HCC)     burning- L leg , numbing of toes   . Anxiety     2005- related to care of aging parent   . Chest pain at rest associated with dyspnea 10/05/2011  . Abnormal nuclear stress test 10/05/2011  . CAD (coronary artery disease)significant diseas with 99% LAD, 80% 1st diag, 80% LCX 10/05/2011  . S/P CABG x 4, LIMA-LAD, SVG-OM2, SVG-diag, SVG-RCA secondary to symptomatic CAD and positive stress test 10/05/2011  . Bunion   . Hyperlipemia     Current Outpatient Prescriptions on File Prior to Visit  Medication Sig Dispense Refill  . amLODipine (NORVASC) 5 MG tablet Take 5 mg by mouth daily.      Marland Kitchen aspirin EC 81 MG tablet Take 1 tablet (81 mg total) by mouth daily. 100 tablet 0  . brimonidine  (ALPHAGAN P) 0.1 % SOLN Place 1 drop into both eyes 2 (two) times daily.      . Calcium-Vitamin D-Vitamin K 500-100-40 MG-UNT-MCG CHEW Chew 1 tablet by mouth 2 (two) times daily.      . Cholecalciferol (VITAMIN D3) 1000 UNITS CAPS Take 2,000 Units by mouth every morning.      . cycloSPORINE (RESTASIS) 0.05 % ophthalmic emulsion Place 1 drop into both eyes 2 (two) times daily.    . fluticasone (FLONASE) 50 MCG/ACT nasal spray Place 2 sprays into the nose as needed for rhinitis.    Marland Kitchen HYDROcodone-acetaminophen (NORCO) 7.5-325 MG tablet Take 1 tablet by mouth every 6 (six) hours as needed for moderate pain. 60 tablet 0  . latanoprost (XALATAN) 0.005 % ophthalmic solution Place 1 drop into both eyes at bedtime.      . lidocaine-prilocaine (EMLA) cream     . metoprolol tartrate (LOPRESSOR) 25 MG tablet Take 12.5 mg by mouth 2 (two) times daily.    . pantoprazole (PROTONIX) 40 MG tablet Take 40 mg by mouth daily.    . predniSONE (DELTASONE) 10 MG tablet 6 tabs po day 1, 5 tabs po day 2, 4 tabs po day 3, 3 tabs po day 4, 2 tabs po day 5, 1 tab po day 6 21 tablet 0   No current facility-administered medications on file prior to visit.    Past Surgical History  Procedure Laterality Date  . Back surgery      1987-   . Eye surgery      cataracts removed- bilateral, no IOL  . Abdominal hysterectomy      partial hysterectomy- 1972  . Tubal ligation    . Toe surgery      R foot- removed calus between 5-4  . Lumbar laminectomy/decompression microdiscectomy  03/04/2011    Procedure: LUMBAR LAMINECTOMY/DECOMPRESSION MICRODISCECTOMY;  Surgeon: Hosie Spangle, MD;  Location: [place] [facility];  Service: Neurosurgery;  Laterality: N/A;  Thoracic Ten-Thoracic Twelve Thoracic Laminectomy  . Coronary artery bypass graft  10/04/2011    Procedure: CORONARY ARTERY BYPASS GRAFTING (CABG);  Surgeon: Gaye Pollack, MD;  Location: [facility];  Service: Open Heart Surgery;  Laterality: N/A;  CABG x four using left internal  mammary artery and right leg greater saphenous vein   . Foot surgery Right January 2013    HT 5  . Nm myoview ltd  09/15/2011    mild ischemia mid anterior & apical anterior region.EF: 86%  . Cardiac catheterization  10/12/2011    high-grade ca+ prox. LAD diagonal branch,higly diseased diagonal branch & obtuse marginal branch  . Left heart catheterization with coronary angiogram N/A 10/02/2011    Procedure: LEFT HEART CATHETERIZATION WITH CORONARY ANGIOGRAM;  Surgeon: Lorretta Harp, MD;  Location: [facility];  Service: Cardiovascular;  Laterality: N/A;    Allergies  Allergen Reactions  . Aspirin Other (See Comments)    GI upset,high doses  . Lactose Intolerance (Gi)     Social History   Social History  . Marital Status: Widowed    Spouse Name: N/A  . Number of Children: N/A  . Years of Education: N/A   Occupational History  . Not on file.   Social History Main Topics  . Smoking status: Never Smoker   . Smokeless tobacco: Never Used  . Alcohol Use: No  . Drug Use: No  . Sexual Activity: Not on file   Other Topics Concern  . Not on file   Social History Narrative    Family History  Problem Relation Age of Onset  . Anesthesia problems Neg Hx   . Hypotension Neg Hx   . Malignant hyperthermia Neg Hx   . Pseudochol deficiency Neg Hx   . Heart attack Mother   . Heart attack Father   . Hypertension Father   . Heart attack Brother     BP 135/74 mmHg  Pulse 60  Ht 4\' 11"  (1.499 m)  Wt 104 lb 9.6 oz (47.446 kg)  BMI 21.12 kg/m2  Review of Systems: See HPI above.    Objective:  Physical Exam:  Gen: NAD  Neck: No gross deformity, swelling, bruising. TTP upper thoracic paraspinal region, trapezius on left.  No midline/bony TTP. FROM neck without pain . BUE strength 5/5.   Sensation intact to light touch.   2+ equal reflexes in triceps, biceps, brachioradialis tendons. Negative spurlings. NV intact distal BUEs.  Left shoulder: No swelling, ecchymoses.   No gross deformity. No TTP. FROM without increase in pain. Negative Hawkins, Neers. Strength 5/5 with empty can and resisted internal/external rotation. NV intact distally.    Assessment & Plan:  1. Left shoulder pain - imaging of thoracic and cervical spine without obvious cause of her pain.  No skin changes to suggest shingles.  Consistent with neuropathic pain and she is improving with time since our last discussion.  Advised to call us in a few days  for an update on her status.  Would consider nortriptyline, neurology referral as likely next steps if she continues to struggle.  ESI is an option but no obvious impingement at a specific level on cervical spine MRI.  F/u in 2 weeks otherwise.

## 2014-12-24 NOTE — Assessment & Plan Note (Signed)
imaging of thoracic and cervical spine without obvious cause of her pain.  No skin changes to suggest shingles.  Consistent with neuropathic pain and she is improving with time since our last discussion.  Advised to call us in a few days for an update on her status.  Would consider nortriptyline, neurology referral as likely next steps if she continues to struggle.  ESI is an option but no obvious impingement at a specific level on cervical spine MRI.  F/u in 2 weeks otherwise.

## 2015-01-07 ENCOUNTER — Telehealth: Payer: Self-pay | Admitting: Cardiovascular Disease

## 2015-01-18 NOTE — Telephone Encounter (Signed)
DOD talked with Dr. Ronnald Ramp.

## 2015-01-24 ENCOUNTER — Telehealth: Payer: Self-pay | Admitting: Cardiovascular Disease

## 2015-01-24 NOTE — Telephone Encounter (Signed)
Agree 

## 2015-01-24 NOTE — Telephone Encounter (Signed)
Spoke to Dr. Kristie Cowman at [facility].  He reports this is 3rd visit pt has made w/ him within past 2 weeks. She had initially c/o of URI symptoms, persistent cough, dyspnea.  Notes initially put on antibiotics for tx of sinus infection, showed improvement  w/ URI symptoms but still has cough and dyspnea.  CXR performed today showed no evidence of active disease. He noted crackles in bases on assessment.  Pt appeared euvolemic on physician's assessment. She denied orthopnea. Cough is persistent & productive w/ clear sputum. Pt denies CP. Notes BP elevated today 177/83 as compared w/ 130/83 at last visit.  He requested flex add-on. I added for next available which is Monday at 3pm to see Truitt Merle. I contacted patient and communicated instructions. i also gave her recommendation to utilize on-call provider line and to notify us if her symptoms are worse.  Pt voiced understanding.  I will route to Dr. Sallyanne Kuster for any further advice.

## 2015-01-27 NOTE — Progress Notes (Addendum)
Cardiology Office Note   Date:  01/28/2015   ID:  Theresa Crosby, DOB Jun 30, 1932, MRN [id number]  PCP:  Theresa Bravo, MD  Cardiologist:  Theresa Crosby   Chief Complaint  Patient presents with  . Shortness of Breath      History of Present Illness: Theresa Crosby is a 81 y.o. female with PMH of CAD s/p CABG (LIMA to LAD, SVG to OM2, SVG to diagonal, SVG to RCA in 09/2011 by Theresa Crosby), HTN, HLD and anxiety who presents for Flex clinic office visit. Her last echo was on 11/02/2011 which showed normal LV size, mild LVH, E/e' ratio > 10 suggested elevated LV filling pressure, no RWMA, PA pressure 71mmHg. She was last seen by Theresa Crosby on 11/15/2014 at which time she did not have any cardiac complaints but did have depression related to financial pressure. She presented today in Flex clinic because she has visited [facility] 3 times in 2 weeks c/o URI symptom with persistent cough and dyspnea. She initially was placed on abx for treatment of sinus infection which showed improvement, but continued to have cough and dyspnea.  Per patient, her symptom started in Oct 2016 and has been gradually getting worse. She initially had chest scar pain and back pain. She says she has been having chronic chest pain since the surgical scar from CABG in 2013. She was given localized steroid injection, however afterward, she started having persistent post nasal drip. She denies any fever or chill. Given the post nasal drip, she started having night time cough and PND like symptom. She also sleep upright. She has been seen at local Urgent care and has since received 2 course of abx without improvement. She was referred by Urgent care doctor to cardiology service for evaluation of cardiac related symptom. Patient is tearful today with persistent chest wall pain, left arm tingling and SOB both with exertion and at night.       Past Medical History  Diagnosis Date  . Hypertension    ireg. heartbeat, no referral made to cardiologist , sees [facility]. Med.   . Shortness of breath   . Blood transfusion     postop- back surg. 1987  . GERD (gastroesophageal reflux disease)   . Arthritis     degeneration - spondylosis, OA - all over body,   . Neuromuscular disorder (HCC)     burning- L leg , numbing of toes   . Anxiety     2005- related to care of aging parent   . Chest pain at rest associated with dyspnea 10/05/2011  . Abnormal nuclear stress test 10/05/2011  . CAD (coronary artery disease)significant diseas with 99% LAD, 80% 1st diag, 80% LCX 10/05/2011  . S/P CABG x 4, LIMA-LAD, SVG-OM2, SVG-diag, SVG-RCA secondary to symptomatic CAD and positive stress test 10/05/2011  . Bunion   . Hyperlipemia     Past Surgical History  Procedure Laterality Date  . Back surgery      1987-   . Eye surgery      cataracts removed- bilateral, no IOL  . Abdominal hysterectomy      partial hysterectomy- 1972  . Tubal ligation    . Toe surgery      R foot- removed calus between 5-4  . Lumbar laminectomy/decompression microdiscectomy  03/04/2011    Procedure: LUMBAR LAMINECTOMY/DECOMPRESSION MICRODISCECTOMY;  Surgeon: Theresa Spangle, MD;  Location: [place] [facility];  Service: Neurosurgery;  Laterality: N/A;  Thoracic Ten-Thoracic Twelve  Thoracic Laminectomy  . Coronary artery bypass graft  10/04/2011    Procedure: CORONARY ARTERY BYPASS GRAFTING (CABG);  Surgeon: Theresa Pollack, MD;  Location: [place];  Service: Open Heart Surgery;  Laterality: N/A;  CABG x four using left internal mammary artery and right leg greater saphenous vein   . Foot surgery Right January 2013    HT 5  . Nm myoview ltd  09/15/2011    mild ischemia mid anterior & apical anterior region.EF: 86%  . Cardiac catheterization  10/12/2011    high-grade ca+ prox. LAD diagonal branch,higly diseased diagonal branch & obtuse marginal branch  . Left heart catheterization with coronary angiogram N/A 10/02/2011    Procedure: LEFT HEART  CATHETERIZATION WITH CORONARY ANGIOGRAM;  Surgeon: Theresa Harp, MD;  Location: [facility] CATH LAB;  Service: Cardiovascular;  Laterality: N/A;     Current Outpatient Prescriptions  Medication Sig Dispense Refill  . albuterol (PROVENTIL HFA;VENTOLIN HFA) 108 (90 BASE) MCG/ACT inhaler Inhale 1-2 puffs into the lungs every 6 (six) hours as needed for wheezing or shortness of breath.    Theresa Kitchen amLODipine (NORVASC) 5 MG tablet Take 5 mg by mouth daily.      Theresa Kitchen aspirin EC 81 MG tablet Take 1 tablet (81 mg total) by mouth daily. 100 tablet 0  . brimonidine (ALPHAGAN P) 0.1 % SOLN Place 1 drop into both eyes 2 (two) times daily.      . Calcium-Vitamin D-Vitamin K 500-100-40 MG-UNT-MCG CHEW Chew 1 tablet by mouth 2 (two) times daily.      . Cholecalciferol (VITAMIN D3) 1000 UNITS CAPS Take 2,000 Units by mouth every morning.      . cycloSPORINE (RESTASIS) 0.05 % ophthalmic emulsion Place 1 drop into both eyes 2 (two) times daily.    . fluticasone (FLONASE) 50 MCG/ACT nasal spray Place 2 sprays into the nose as needed for rhinitis.    Theresa Kitchen latanoprost (XALATAN) 0.005 % ophthalmic solution Place 1 drop into both eyes at bedtime.      . pantoprazole (PROTONIX) 40 MG tablet Take 40 mg by mouth daily.    . cetirizine (ZYRTEC ALLERGY) 10 MG tablet Take 1 tablet (10 mg total) by mouth daily. 30 tablet 11  . metoprolol tartrate (LOPRESSOR) 25 MG tablet Take 12.5 mg by mouth 2 (two) times daily.    . rosuvastatin (CRESTOR) 10 MG tablet Take 1 tablet (10 mg total) by mouth daily. 30 tablet 11   No current facility-administered medications for this visit.    Allergies:   Aspirin and Lactose intolerance (gi)    Social History:  The patient  reports that she has never smoked. She has never used smokeless tobacco. She reports that she does not drink alcohol or use illicit drugs.   Family History:  The patient's family history includes Heart attack in her brother, father, and mother; Hypertension in her father. There is no  history of Anesthesia problems, Hypotension, Malignant hyperthermia, or Pseudochol deficiency.    ROS:  Please see the history of present illness.   Otherwise, review of systems are positive for worsening dyspnea, nasal drainage, chest pain and cough.   All other systems are reviewed and negative.    PHYSICAL EXAM: VS:  BP 112/54 mmHg  Pulse 70  Ht 4\' 11"  (1.499 m)  Wt 105 lb 9.6 oz (47.9 kg)  BMI 21.32 kg/m2  SpO2 91% , BMI Body mass index is 21.32 kg/(m^2). GEN: Well nourished, well developed, in no acute distress HEENT: normal Neck: no  JVD, carotid bruits, or masses Cardiac: RRR; no murmurs, rubs, or gallops,no edema  Respiratory:  clear to auscultation bilaterally with occasional rhonchi/rale but no wheezing, normal work of breathing GI: soft, nontender, nondistended, + BS MS: no deformity or atrophy Skin: warm and dry, no rash Neuro:  Strength and sensation are intact Psych: euthymic mood, full affect   EKG:  EKG is ordered today. The ekg ordered today demonstrates NSR without significant ST-T wave changes   Recent Labs: 11/28/2014: Hemoglobin 12.7; Platelets 215 12/19/2014: ALT 14; BUN 8; Creat 0.69; Potassium 3.9; Sodium 136; TSH 0.721    Lipid Panel    Component Value Date/Time   CHOL 232* 11/15/2014 1640   TRIG 102 11/15/2014 1640   HDL 64 11/15/2014 1640   CHOLHDL 3.6 11/15/2014 1640   VLDL 20 11/15/2014 1640   LDLCALC 148* 11/15/2014 1640      Wt Readings from Last 3 Encounters:  01/28/15 105 lb 9.6 oz (47.9 kg)  12/20/14 104 lb 9.6 oz (47.446 kg)  12/04/14 105 lb 3.2 oz (47.718 kg)      Other studies Reviewed: Additional studies/ records that were reviewed today include: previous office note, recent urgent care note. Review of the above records demonstrates: CABG 2013, multiple recent urgent care visit due to dyspnea   ASSESSMENT AND PLAN:  1.  Worsening dyspnea both with exertion and at night  The symptom with persistent nasal drip seem to  point toward URI symptom, recent CXR negative, she does not have significant LE edema or JVD on exam, she does have intermittent rhonchi/rale in bilateral bases on exam. I did not want to hit her with PRN lasix, but will obtain echo to establish baseline as her last echo did not mention EF. Obtain CBC, BMET and BNP. If BNP high and renal function stable, then will consider given her a trial of lasix. Otherwise her symptom is more consistent with cough related nasal drip, I will start her on Zyrtec daily.  2. Persistent chest wall pain: this symptom is chronic, but her increasing DOE is concerning which she says is similar to her symptom prior to cath. Discussed with Dr. Mare Ferrari, will obtain myoview.  3. CAD s/p CABG (LIMA to LAD, SVG to OM2, SVG to diagonal, SVG to RCA in 09/2011 by Theresa Crosby)  4. HTN: well controlled today  5. HLD: she has not started on crestor that was given to her in September. I will start Crestor and obtain LFT today.    Current medicines are reviewed at length with the patient today.  The patient does not have concerns regarding medicines.  The following changes have been made:  Start crestor and zyrtec  Labs/ tests ordered today include:   Orders Placed This Encounter  Procedures  . CBC w/Diff  . Basic Metabolic Panel (BMET)  . B Nat Peptide  . Hepatic function panel  . Myocardial Perfusion Imaging  . EKG 12-Lead  . Echocardiogram     Disposition:   FU with me in 4 weeks  Theresa Corrigan PA 01/28/2015 4:58 PM    [facility] [place], [place], [place]  [address] Phone: [phone]; Fax: [phone]

## 2015-01-28 ENCOUNTER — Ambulatory Visit: Payer: Medicare Other | Admitting: Nurse Practitioner

## 2015-01-28 ENCOUNTER — Encounter: Payer: Self-pay | Admitting: Physician Assistant

## 2015-01-28 ENCOUNTER — Ambulatory Visit (INDEPENDENT_AMBULATORY_CARE_PROVIDER_SITE_OTHER): Payer: Medicare Other | Admitting: Physician Assistant

## 2015-01-28 VITALS — BP 112/54 | HR 70 | Ht 59.0 in | Wt 105.6 lb

## 2015-01-28 DIAGNOSIS — R0600 Dyspnea, unspecified: Secondary | ICD-10-CM | POA: Diagnosis not present

## 2015-01-28 DIAGNOSIS — R0602 Shortness of breath: Secondary | ICD-10-CM | POA: Diagnosis not present

## 2015-01-28 DIAGNOSIS — R079 Chest pain, unspecified: Secondary | ICD-10-CM

## 2015-01-28 LAB — BASIC METABOLIC PANEL
BUN: 9 mg/dL (ref 7–25)
CO2: 28 mmol/L (ref 20–31)
Calcium: 9.8 mg/dL (ref 8.6–10.4)
Chloride: 103 mmol/L (ref 98–110)
Creat: 0.65 mg/dL (ref 0.60–0.88)
Glucose, Bld: 89 mg/dL (ref 65–99)
Potassium: 4.4 mmol/L (ref 3.5–5.3)
Sodium: 139 mmol/L (ref 135–146)

## 2015-01-28 LAB — CBC WITH DIFFERENTIAL/PLATELET
Basophils Absolute: 0.1 10*3/uL (ref 0.0–0.1)
Basophils Relative: 1 % (ref 0–1)
Eosinophils Absolute: 0.8 10*3/uL — ABNORMAL HIGH (ref 0.0–0.7)
Eosinophils Relative: 15 % — ABNORMAL HIGH (ref 0–5)
HCT: 41.4 % (ref 36.0–46.0)
Hemoglobin: 13.6 g/dL (ref 12.0–15.0)
Lymphocytes Relative: 48 % — ABNORMAL HIGH (ref 12–46)
Lymphs Abs: 2.7 10*3/uL (ref 0.7–4.0)
MCH: 30.4 pg (ref 26.0–34.0)
MCHC: 32.9 g/dL (ref 30.0–36.0)
MCV: 92.4 fL (ref 78.0–100.0)
MPV: 10.5 fL (ref 8.6–12.4)
Monocytes Absolute: 0.6 10*3/uL (ref 0.1–1.0)
Monocytes Relative: 10 % (ref 3–12)
Neutro Abs: 1.5 10*3/uL — ABNORMAL LOW (ref 1.7–7.7)
Neutrophils Relative %: 26 % — ABNORMAL LOW (ref 43–77)
Platelets: 287 10*3/uL (ref 150–400)
RBC: 4.48 MIL/uL (ref 3.87–5.11)
RDW: 14.4 % (ref 11.5–15.5)
WBC: 5.6 10*3/uL (ref 4.0–10.5)

## 2015-01-28 LAB — HEPATIC FUNCTION PANEL
ALT: 17 U/L (ref 6–29)
AST: 26 U/L (ref 10–35)
Albumin: 4.2 g/dL (ref 3.6–5.1)
Alkaline Phosphatase: 91 U/L (ref 33–130)
Bilirubin, Direct: 0.1 mg/dL (ref <=0.2)
Indirect Bilirubin: 0.2 mg/dL (ref 0.2–1.2)
Total Bilirubin: 0.3 mg/dL (ref 0.2–1.2)
Total Protein: 6.9 g/dL (ref 6.1–8.1)

## 2015-01-28 MED ORDER — ROSUVASTATIN CALCIUM 10 MG PO TABS: 10.0000 mg | ORAL_TABLET | Freq: Every day | ORAL | Status: DC

## 2015-01-28 MED ORDER — CETIRIZINE HCL 10 MG PO TABS
10.0000 mg | ORAL_TABLET | Freq: Every day | ORAL | Status: DC
Start: 2015-01-28 — End: 2016-06-29

## 2015-01-28 NOTE — Patient Instructions (Signed)
Medication Instructions:  Your physician has recommended you make the following change in your medication:  1. Re-start zyrtec over the counter ( 10 mg ) daily 2.Start Crestor ( 10 mg ) daily   Labwork: Your physician recommends that you have lab work today: cbc/bmet/bnp/lft   Testing/Procedures: Your physician has requested that you have a lexiscan myoview. For further information please visit [email or web address]. Please follow instruction sheet, as given.  Your physician has requested that you have an echocardiogram. Echocardiography is a painless test that uses sound waves to create images of your heart. It provides your doctor with information about the size and shape of your heart and how well your heart's chambers and valves are working. This procedure takes approximately one hour. There are no restrictions for this procedure.    Follow-Up: Your physician recommends that you schedule a follow-up appointment after all other testings with Almyra Deforest, PA-C 2- 4 weeks   Any Other Special Instructions Will Be Listed Below (If Applicable). Patient will call back to schedule testing due to patient's schedule.  Will give patient all paperwork todayl.  If you need a refill on your cardiac medications before your next appointment, please call your pharmacy.

## 2015-01-29 LAB — BRAIN NATRIURETIC PEPTIDE: Brain Natriuretic Peptide: 96.6 pg/mL (ref 0.0–100.0)

## 2015-01-30 ENCOUNTER — Telehealth: Payer: Self-pay | Admitting: *Deleted

## 2015-01-30 NOTE — Telephone Encounter (Signed)
Called pt to inform her that so far all of her labs are looking good.  We are waiting on her BNP results and once we get that, we will call her back.  Pt verbalized understanding.

## 2015-01-30 NOTE — Telephone Encounter (Signed)
-----   Message from [place], [place] sent at 01/29/2015 10:35 AM EST ----- Normal red blood cell count, normal white blood cell count. Normal liver function test, ok to continue cholesterol medication. Kidney function stable. Still waiting on the result of BNP which is a lab to check fluid status

## 2015-01-31 ENCOUNTER — Encounter: Payer: Self-pay | Admitting: Physician Assistant

## 2015-02-03 ENCOUNTER — Emergency Department (HOSPITAL_COMMUNITY): Payer: Medicare Other

## 2015-02-03 ENCOUNTER — Encounter (HOSPITAL_COMMUNITY): Payer: Self-pay | Admitting: *Deleted

## 2015-02-03 ENCOUNTER — Emergency Department (HOSPITAL_COMMUNITY)
Admission: EM | Admit: 2015-02-03 | Discharge: 2015-02-04 | Disposition: A | Discharge status: Home / Self Care | Payer: Medicare Other | Attending: Emergency Medicine | Admitting: Emergency Medicine

## 2015-02-03 DIAGNOSIS — Z9889 Other specified postprocedural states: Secondary | ICD-10-CM | POA: Insufficient documentation

## 2015-02-03 DIAGNOSIS — R000 Tachycardia, unspecified: Secondary | ICD-10-CM | POA: Insufficient documentation

## 2015-02-03 DIAGNOSIS — M1990 Unspecified osteoarthritis, unspecified site: Secondary | ICD-10-CM | POA: Insufficient documentation

## 2015-02-03 DIAGNOSIS — I10 Essential (primary) hypertension: Secondary | ICD-10-CM | POA: Diagnosis not present

## 2015-02-03 DIAGNOSIS — I2510 Atherosclerotic heart disease of native coronary artery without angina pectoris: Secondary | ICD-10-CM | POA: Diagnosis not present

## 2015-02-03 DIAGNOSIS — Z951 Presence of aortocoronary bypass graft: Secondary | ICD-10-CM | POA: Diagnosis not present

## 2015-02-03 DIAGNOSIS — F419 Anxiety disorder, unspecified: Secondary | ICD-10-CM | POA: Diagnosis not present

## 2015-02-03 DIAGNOSIS — R634 Abnormal weight loss: Secondary | ICD-10-CM | POA: Insufficient documentation

## 2015-02-03 DIAGNOSIS — E785 Hyperlipidemia, unspecified: Secondary | ICD-10-CM | POA: Diagnosis not present

## 2015-02-03 DIAGNOSIS — Z7951 Long term (current) use of inhaled steroids: Secondary | ICD-10-CM | POA: Insufficient documentation

## 2015-02-03 DIAGNOSIS — R062 Wheezing: Secondary | ICD-10-CM | POA: Diagnosis not present

## 2015-02-03 DIAGNOSIS — Z79899 Other long term (current) drug therapy: Secondary | ICD-10-CM | POA: Insufficient documentation

## 2015-02-03 DIAGNOSIS — G709 Myoneural disorder, unspecified: Secondary | ICD-10-CM | POA: Insufficient documentation

## 2015-02-03 DIAGNOSIS — K219 Gastro-esophageal reflux disease without esophagitis: Secondary | ICD-10-CM | POA: Insufficient documentation

## 2015-02-03 DIAGNOSIS — R059 Cough, unspecified: Secondary | ICD-10-CM

## 2015-02-03 DIAGNOSIS — Z7982 Long term (current) use of aspirin: Secondary | ICD-10-CM | POA: Diagnosis not present

## 2015-02-03 DIAGNOSIS — R05 Cough: Secondary | ICD-10-CM | POA: Diagnosis present

## 2015-02-03 LAB — CBC WITH DIFFERENTIAL/PLATELET
Basophils Absolute: 0.1 10*3/uL (ref 0.0–0.1)
Basophils Relative: 2 %
Eosinophils Absolute: 0.2 10*3/uL (ref 0.0–0.7)
Eosinophils Relative: 6 %
HCT: 41.1 % (ref 36.0–46.0)
Hemoglobin: 13.8 g/dL (ref 12.0–15.0)
Lymphocytes Relative: 43 %
Lymphs Abs: 1.7 10*3/uL (ref 0.7–4.0)
MCH: 30.5 pg (ref 26.0–34.0)
MCHC: 33.6 g/dL (ref 30.0–36.0)
MCV: 90.7 fL (ref 78.0–100.0)
Monocytes Absolute: 0.9 10*3/uL (ref 0.1–1.0)
Monocytes Relative: 23 %
Neutro Abs: 1 10*3/uL — ABNORMAL LOW (ref 1.7–7.7)
Neutrophils Relative %: 26 %
Platelets: 257 10*3/uL (ref 150–400)
RBC: 4.53 MIL/uL (ref 3.87–5.11)
RDW: 14.5 % (ref 11.5–15.5)
WBC: 4 10*3/uL (ref 4.0–10.5)

## 2015-02-03 MED ORDER — PREDNISONE 20 MG PO TABS
60.0000 mg | ORAL_TABLET | Freq: Once | ORAL | Status: AC
  Administered 2015-02-03: 60 mg via ORAL
  Filled 2015-02-03: qty 3

## 2015-02-03 MED ORDER — METOPROLOL TARTRATE 25 MG PO TABS
12.5000 mg | ORAL_TABLET | Freq: Once | ORAL | Status: AC
  Administered 2015-02-03: 12.5 mg via ORAL
  Filled 2015-02-03: qty 1

## 2015-02-03 MED ORDER — ALBUTEROL SULFATE (2.5 MG/3ML) 0.083% IN NEBU
5.0000 mg | INHALATION_SOLUTION | Freq: Once | RESPIRATORY_TRACT | Status: AC
Start: 2015-02-03 — End: 2015-02-03
  Administered 2015-02-03: 5 mg via RESPIRATORY_TRACT
  Filled 2015-02-03: qty 6

## 2015-02-03 NOTE — ED Notes (Signed)
The pt has had a cold and cough for 2 months.  The  Cough is poroductive white.  She has seen a doctor 2-3 days ago and is scheduled for cardiac studies.  No chest pain only soreness in her chest from coughing,  No difficulty breathing

## 2015-02-03 NOTE — ED Provider Notes (Signed)
CSN: [id number]     Arrival date & time 02/03/15  2249 History  By signing my name below, I, Jolayne Panther, attest that this documentation has been prepared under the direction and in the presence of Merrily Pew, MD. Electronically Signed: Jolayne Panther, Scribe. 02/03/2015. 11:50 PM.    Chief Complaint  Patient presents with  . Cough    The history is provided by the patient. No language interpreter was used.    HPI Comments: Theresa Crosby is a 81 y.o. female with a hx of heart bypass surgery, who presents to the Emergency Department complaining of an ongoing productive cough with associated, constant SOB and chest pain secondary to cough for about a month. She notes clear sputum with her cough and also complains of associated abdominal pain secondary to cough. Pt reports that that she had sinusitis over a month ago and notes that after that, her coughing spells became worse. She reports recent weight loss of about five pounds over the last couple of weeks and notes loss of appetite as well. Pt notes that her coughing fits are mostly at night. She also reports trying to sit upright with no relief of the coughing spells. Pt reports that she has been compliant with her blood pressure medications. She also reports that last year she was prescribed an inhaler which she has recently been using when she goes on her walks. Pt denies leg swelling, fever, and rash. She denies hx of smoking, blood clot and cancer.   Past Medical History  Diagnosis Date  . Hypertension     ireg. heartbeat, no referral made to cardiologist , sees [facility]. Med.   . Shortness of breath   . Blood transfusion     postop- back surg. 1987  . GERD (gastroesophageal reflux disease)   . Arthritis     degeneration - spondylosis, OA - all over body,   . Neuromuscular disorder (HCC)     burning- L leg , numbing of toes   . Anxiety     2005- related to care of aging parent   . Chest pain at rest associated with  dyspnea 10/05/2011  . Abnormal nuclear stress test 10/05/2011  . CAD (coronary artery disease)significant diseas with 99% LAD, 80% 1st diag, 80% LCX 10/05/2011  . S/P CABG x 4, LIMA-LAD, SVG-OM2, SVG-diag, SVG-RCA secondary to symptomatic CAD and positive stress test 10/05/2011  . Bunion   . Hyperlipemia    Past Surgical History  Procedure Laterality Date  . Back surgery      1987-   . Eye surgery      cataracts removed- bilateral, no IOL  . Abdominal hysterectomy      partial hysterectomy- 1972  . Tubal ligation    . Toe surgery      R foot- removed calus between 5-4  . Lumbar laminectomy/decompression microdiscectomy  03/04/2011    Procedure: LUMBAR LAMINECTOMY/DECOMPRESSION MICRODISCECTOMY;  Surgeon: Hosie Spangle, MD;  Location: [facility];  Service: Neurosurgery;  Laterality: N/A;  Thoracic Ten-Thoracic Twelve Thoracic Laminectomy  . Coronary artery bypass graft  10/04/2011    Procedure: CORONARY ARTERY BYPASS GRAFTING (CABG);  Surgeon: Gaye Pollack, MD;  Location: [facility];  Service: Open Heart Surgery;  Laterality: N/A;  CABG x four using left internal mammary artery and right leg greater saphenous vein   . Foot surgery Right January 2013    HT 5  . Nm myoview ltd  09/15/2011    mild ischemia mid  anterior & apical anterior region.EF: 86%  . Cardiac catheterization  10/12/2011    high-grade ca+ prox. LAD diagonal branch,higly diseased diagonal branch & obtuse marginal branch  . Left heart catheterization with coronary angiogram N/A 10/02/2011    Procedure: LEFT HEART CATHETERIZATION WITH CORONARY ANGIOGRAM;  Surgeon: Lorretta Harp, MD;  Location: [facility] CATH LAB;  Service: Cardiovascular;  Laterality: N/A;   Family History  Problem Relation Age of Onset  . Anesthesia problems Neg Hx   . Hypotension Neg Hx   . Malignant hyperthermia Neg Hx   . Pseudochol deficiency Neg Hx   . Heart attack Mother   . Heart attack Father   . Hypertension Father   . Heart attack Brother     Social History  Substance Use Topics  . Smoking status: Never Smoker   . Smokeless tobacco: Never Used  . Alcohol Use: No   OB History    No data available     Review of Systems  Constitutional: Positive for appetite change and unexpected weight change. Negative for fever.  Eyes: Negative for visual disturbance.  Respiratory: Positive for cough and shortness of breath.   Cardiovascular: Positive for chest pain. Negative for leg swelling.  Skin: Negative for rash.  All other systems reviewed and are negative.   Allergies  Aspirin and Lactose intolerance (gi)  Home Medications   Prior to Admission medications   Medication Sig Start Date End Date Taking? Authorizing Provider  albuterol (PROVENTIL HFA;VENTOLIN HFA) 108 (90 BASE) MCG/ACT inhaler Inhale 1-2 puffs into the lungs every 6 (six) hours as needed for wheezing or shortness of breath.   Yes Historical Provider, MD  amLODipine (NORVASC) 5 MG tablet Take 5 mg by mouth daily.     Yes Historical Provider, MD  aspirin EC 81 MG tablet Take 1 tablet (81 mg total) by mouth daily. 11/02/11  Yes Monika Salk, MD  brimonidine (ALPHAGAN P) 0.1 % SOLN Place 1 drop into both eyes 2 (two) times daily.     Yes Historical Provider, MD  Calcium-Vitamin D-Vitamin K 500-100-40 MG-UNT-MCG CHEW Chew 1 tablet by mouth 2 (two) times daily.     Yes Historical Provider, MD  cetirizine (ZYRTEC ALLERGY) 10 MG tablet Take 1 tablet (10 mg total) by mouth daily. 01/28/15  Yes Almyra Deforest, PA  Cholecalciferol (VITAMIN D3) 1000 UNITS CAPS Take 2,000 Units by mouth every morning.     Yes Historical Provider, MD  cycloSPORINE (RESTASIS) 0.05 % ophthalmic emulsion Place 1 drop into both eyes 2 (two) times daily.   Yes Historical Provider, MD  fluticasone (FLONASE) 50 MCG/ACT nasal spray Place 2 sprays into the nose as needed for rhinitis.   Yes Historical Provider, MD  latanoprost (XALATAN) 0.005 % ophthalmic solution Place 1 drop into both eyes at bedtime.     Yes  Historical Provider, MD  metoprolol tartrate (LOPRESSOR) 25 MG tablet Take 12.5 mg by mouth 2 (two) times daily.   Yes Historical Provider, MD  pantoprazole (PROTONIX) 40 MG tablet Take 40 mg by mouth daily.   Yes Historical Provider, MD  rosuvastatin (CRESTOR) 10 MG tablet Take 1 tablet (10 mg total) by mouth daily. 01/28/15  Yes Almyra Deforest, PA  HYDROcodone-homatropine (HYCODAN) 5-1.5 MG/5ML syrup Take 5 mLs by mouth every 6 (six) hours as needed for cough. 02/04/15   Merrily Pew, MD  predniSONE (DELTASONE) 20 MG tablet Take 2 tablets (40 mg total) by mouth daily with breakfast. 02/05/15 02/08/15  Merrily Pew, MD  BP 139/94 mmHg  Pulse 97  Temp(Src) 99.2 F (37.3 C) (Oral)  Resp 20  SpO2 97% Physical Exam  Constitutional: She is oriented to person, place, and time. She appears well-developed and well-nourished. No distress.  HENT:  Head: Normocephalic.  Eyes: Conjunctivae are normal.  Cardiovascular: Regular rhythm.   Tachycardic   Pulmonary/Chest: Effort normal. She has wheezes.  Diminished breath sounds and mild wheezing bilaterally  Abdominal: She exhibits no distension.  Abdomen benign   Neurological: She is alert and oriented to person, place, and time.  Skin: Skin is warm and dry.  Psychiatric: She has a normal mood and affect.  Nursing note and vitals reviewed.   ED Course  Procedures  DIAGNOSTIC STUDIES:    Oxygen Saturation is 95% on RA, adequate by my interpretation.   COORDINATION OF CARE:  11:29 PM Will review labs and chest xray. Will add on D dimer for PE eval.  Discussed treatment plan with pt at bedside and pt agreed to plan.     Labs Review Labs Reviewed  CBC WITH DIFFERENTIAL/PLATELET - Abnormal; Notable for the following:    Neutro Abs 1.0 (*)    All other components within normal limits  BRAIN NATRIURETIC PEPTIDE  TROPONIN I  D-DIMER, QUANTITATIVE (NOT AT [facility])    Imaging Review Dg Chest 2 View  02/04/2015  CLINICAL DATA:  Initial  evaluation for two-month history of cough, chest soreness. EXAM: CHEST  2 VIEW COMPARISON:  Prior study from 11/28/2014. FINDINGS: Median sternotomy wires with underlying CABG markers noted, stable. Cardiac and mediastinal silhouettes within normal limits. Tortuosity the intrathoracic aorta noted, stable. Lungs are normally inflated. No focal infiltrate, pulmonary edema, or pleural effusion. No pneumothorax. No acute osseus abnormality. IMPRESSION: 1. No active cardiopulmonary disease. 2. Sequela of prior CABG. Electronically Signed   By: Jeannine Boga M.D.   On: 02/04/2015 00:09   I have personally reviewed and evaluated these images and lab results as part of my medical decision-making.   EKG Interpretation   Date/Time:  Sunday February 03 2015 23:06:43 EST Ventricular Rate:  123 PR Interval:  140 QRS Duration: 60 QT Interval:  306 QTC Calculation: 438 R Axis:   67 Text Interpretation:  Sinus tachycardia Nonspecific T wave abnormality  Abnormal ECG Confirmed by Nikolay Demetriou MD, Corene Cornea [phone]) on 02/03/2015 11:18:10  PM      MDM   Final diagnoses:  Cough    81 yo f here with one month of cough and associated abdominal pain after coughing fits. No h/o same. Does have h/o smoking. Exam with decreased bs and whezing diffusely.  D dimer negative, doubt PE.  BNP, CXR and ECG ok without LE edema - doubt pneumonia or cardiac causes at this time. No fever or change in productive cough, will not start abx. Symptoms improved with steroids and duo neb - possibly bronchitis.  Has cardiology follow up scheduled, however I don't think this is an acute cardiac issue.  I personally performed the services described in this documentation, which was scribed in my presence. The recorded information has been reviewed and is accurate.     Merrily Pew, MD 02/04/15 [phone]

## 2015-02-04 LAB — BRAIN NATRIURETIC PEPTIDE: B Natriuretic Peptide: 45.8 pg/mL (ref 0.0–100.0)

## 2015-02-04 LAB — TROPONIN I: Troponin I: <0.03 ng/mL (ref <0.031)

## 2015-02-04 LAB — D-DIMER, QUANTITATIVE (NOT AT ARMC): D-Dimer, Quant: 0.44 ug/mL-FEU (ref 0.00–0.50)

## 2015-02-04 MED ORDER — HYDROCODONE-HOMATROPINE 5-1.5 MG/5ML PO SYRP: 5.0000 mL | ORAL_SOLUTION | Freq: Four times a day (QID) | ORAL | Status: DC | PRN

## 2015-02-04 MED ORDER — PREDNISONE 20 MG PO TABS: 40.0000 mg | ORAL_TABLET | Freq: Every day | ORAL | Status: AC

## 2015-02-05 ENCOUNTER — Telehealth: Payer: Self-pay | Admitting: *Deleted

## 2015-02-05 ENCOUNTER — Telehealth (HOSPITAL_COMMUNITY): Payer: Self-pay | Admitting: *Deleted

## 2015-02-05 NOTE — Telephone Encounter (Signed)
-----   Message from [place], [place] sent at 02/05/2015  9:03 AM EST ----- BNP came back normal, no sign of fluid overload

## 2015-02-05 NOTE — Telephone Encounter (Signed)
Patient given detailed instructions per Myocardial Perfusion Study Information Sheet for the test on 02/06/15 at 0945. Patient notified to arrive 15 minutes early and that it is imperative to arrive on time for appointment to keep from having the test rescheduled.  If you need to cancel or reschedule your appointment, please call the office within 24 hours of your appointment. Failure to do so may result in a cancellation of your appointment, and a $50 no show fee. Patient verbalized understanding.Marchele Decock, Ranae Palms

## 2015-02-05 NOTE — Telephone Encounter (Signed)
Called pt to make here aware that her labs didn't show any sign of fluid overload.  She also was reminded of her stress test appt 02/06/15.  PT verbalized understanding.

## 2015-02-06 ENCOUNTER — Ambulatory Visit (HOSPITAL_COMMUNITY): Payer: Medicare Other | Attending: Cardiology

## 2015-02-06 DIAGNOSIS — R9439 Abnormal result of other cardiovascular function study: Secondary | ICD-10-CM | POA: Insufficient documentation

## 2015-02-06 DIAGNOSIS — R079 Chest pain, unspecified: Secondary | ICD-10-CM | POA: Insufficient documentation

## 2015-02-06 DIAGNOSIS — I2510 Atherosclerotic heart disease of native coronary artery without angina pectoris: Secondary | ICD-10-CM | POA: Insufficient documentation

## 2015-02-06 DIAGNOSIS — R0609 Other forms of dyspnea: Secondary | ICD-10-CM | POA: Insufficient documentation

## 2015-02-06 DIAGNOSIS — R0602 Shortness of breath: Secondary | ICD-10-CM

## 2015-02-06 DIAGNOSIS — R0600 Dyspnea, unspecified: Secondary | ICD-10-CM | POA: Diagnosis not present

## 2015-02-06 DIAGNOSIS — I10 Essential (primary) hypertension: Secondary | ICD-10-CM | POA: Insufficient documentation

## 2015-02-06 LAB — MYOCARDIAL PERFUSION IMAGING
LV dias vol: 40 mL
LV sys vol: 5 mL
Peak HR: 111 {beats}/min
RATE: 0.31
Rest HR: 64 {beats}/min
SDS: 8
SRS: 8
SSS: 16
TID: 0.94

## 2015-02-06 MED ORDER — TECHNETIUM TC 99M SESTAMIBI GENERIC - CARDIOLITE
10.1000 | Freq: Once | INTRAVENOUS | Status: AC | PRN
  Administered 2015-02-06: 10.1 via INTRAVENOUS

## 2015-02-06 MED ORDER — REGADENOSON 0.4 MG/5ML IV SOLN
0.4000 mg | Freq: Once | INTRAVENOUS | Status: AC
  Administered 2015-02-06: 0.4 mg via INTRAVENOUS

## 2015-02-06 MED ORDER — TECHNETIUM TC 99M SESTAMIBI GENERIC - CARDIOLITE
30.8000 | Freq: Once | INTRAVENOUS | Status: AC | PRN
  Administered 2015-02-06: 30.8 via INTRAVENOUS

## 2015-02-07 ENCOUNTER — Telehealth: Payer: Self-pay | Admitting: *Deleted

## 2015-02-07 NOTE — Telephone Encounter (Signed)
Called pt and made her aware that her stress test was normal, per Almyra Deforest, PA-C.  Pt verbalized understanding.

## 2015-02-07 NOTE — Telephone Encounter (Signed)
-----   Message from [place], [place] sent at 02/07/2015  3:17 PM EST ----- Normal stress test and very assuring result read by the patient's primary cardiologist, Dr. Sallyanne Kuster.

## 2015-02-13 ENCOUNTER — Ambulatory Visit (HOSPITAL_COMMUNITY): Payer: Medicare Other | Attending: Cardiovascular Disease

## 2015-02-13 ENCOUNTER — Other Ambulatory Visit: Payer: Self-pay

## 2015-02-13 DIAGNOSIS — I059 Rheumatic mitral valve disease, unspecified: Secondary | ICD-10-CM | POA: Diagnosis not present

## 2015-02-13 DIAGNOSIS — R0602 Shortness of breath: Secondary | ICD-10-CM | POA: Diagnosis not present

## 2015-02-13 DIAGNOSIS — E785 Hyperlipidemia, unspecified: Secondary | ICD-10-CM | POA: Diagnosis not present

## 2015-02-13 DIAGNOSIS — I517 Cardiomegaly: Secondary | ICD-10-CM | POA: Diagnosis not present

## 2015-02-13 DIAGNOSIS — I371 Nonrheumatic pulmonary valve insufficiency: Secondary | ICD-10-CM | POA: Diagnosis not present

## 2015-02-13 DIAGNOSIS — I10 Essential (primary) hypertension: Secondary | ICD-10-CM | POA: Diagnosis not present

## 2015-02-13 DIAGNOSIS — R079 Chest pain, unspecified: Secondary | ICD-10-CM | POA: Insufficient documentation

## 2015-02-13 DIAGNOSIS — R0600 Dyspnea, unspecified: Secondary | ICD-10-CM

## 2015-02-15 ENCOUNTER — Telehealth: Payer: Self-pay | Admitting: Cardiovascular Disease

## 2015-02-15 ENCOUNTER — Telehealth: Payer: Self-pay | Admitting: *Deleted

## 2015-02-15 NOTE — Telephone Encounter (Signed)
Pt is calling in to speak with a nurse about the echo and the  stress test she has done. She wanted them explain to her in more detail. Please f/u with her  Thanks

## 2015-02-15 NOTE — Telephone Encounter (Signed)
Called Theresa Crosby, per Almyra Deforest, PA-C, and let her know that her ECHO showed normal pumping function, but however, it did show that one part of her heart was not moving as well as the other parts, but her Stress test was normal, which was reassuring to [place].  She was made aware that she did have some stiffening of her heart which will require better rate control.  Some leaky valve, but nothing severe and does not need to be treated at this time. Theresa Crosby verbalized understanding.

## 2015-02-15 NOTE — Telephone Encounter (Signed)
SPOKE TO PATIENT  REASSURED HER CONCERNING RESULTS  DISCUSS TO GRADUALLY INCREASE EXERCISE  UP 30 MIN 5 DAYS A  WEEK. PATIENT VERBALIZED UNDERSTANDING

## 2015-02-15 NOTE — Telephone Encounter (Signed)
-----   Message from [place], [place] sent at 02/15/2015  8:53 AM EST ----- Echo of heart normal pumping function, it does show one part of the heart is not moving as well as other parts, but this type of study is less sensitive than stress test, the stress test was normal which is reassuring. She does have some stiffening of heart, which will require better rate control. Some leaky valve but not severe, does not need to be treated at this time.

## 2015-02-28 ENCOUNTER — Ambulatory Visit (INDEPENDENT_AMBULATORY_CARE_PROVIDER_SITE_OTHER): Payer: Medicare Other | Admitting: Podiatry

## 2015-02-28 ENCOUNTER — Encounter: Payer: Self-pay | Admitting: Podiatry

## 2015-02-28 VITALS — BP 151/82 | HR 77

## 2015-02-28 DIAGNOSIS — M79606 Pain in leg, unspecified: Secondary | ICD-10-CM | POA: Diagnosis not present

## 2015-02-28 DIAGNOSIS — B351 Tinea unguium: Secondary | ICD-10-CM | POA: Diagnosis not present

## 2015-02-28 NOTE — Progress Notes (Signed)
Subjective: 82 year old female presents complaining of pain in between the 1st and 2nd toe right and big toe on right foot. She was having problem with numbness and painful toes.  Objective: Thick hypertrophic nails x 10. Ingrown nail on right great toe. Digital corn 2nd medial right painful.  Pedal pulses are palpable.  Assessment: Painful mycotic nails x 10. Ingrown nail right hallux from thick nail plate.  Plan: Debrided all nails and corn.  Return in 3 months or as needed.

## 2015-02-28 NOTE — Patient Instructions (Signed)
Seen for hypertrophic nails. All nails debrided. Return in 3 months or as needed.  

## 2015-04-04 ENCOUNTER — Ambulatory Visit (INDEPENDENT_AMBULATORY_CARE_PROVIDER_SITE_OTHER): Payer: Medicare Other | Admitting: Podiatry

## 2015-04-04 ENCOUNTER — Encounter: Payer: Self-pay | Admitting: Podiatry

## 2015-04-04 VITALS — BP 129/62 | HR 66

## 2015-04-04 DIAGNOSIS — M2040 Other hammer toe(s) (acquired), unspecified foot: Secondary | ICD-10-CM

## 2015-04-04 DIAGNOSIS — Q828 Other specified congenital malformations of skin: Secondary | ICD-10-CM

## 2015-04-04 DIAGNOSIS — M79675 Pain in left toe(s): Secondary | ICD-10-CM | POA: Diagnosis not present

## 2015-04-04 NOTE — Patient Instructions (Signed)
Pain in right 2nd toe with corn in between digit. Debrided. Corn pad dispensed. Return for next appointment.

## 2015-04-04 NOTE — Progress Notes (Signed)
Subjective: 82 year old female presents complaining of pain in between the 1st and 2nd toe right and big toe on right foot. She was having problem with numbness and painful toes.  Objective: Digital corn 2nd medial right painful.  No abnormal edema or erythema noted. Pedal pulses are palpable.  Assessment: Painful digital corn in between digits right foot 2nd toe.  Plan: Debrided painful lesion. Corn pad dispensed. Return in 3 months or as needed

## 2015-05-29 ENCOUNTER — Encounter: Payer: Self-pay | Admitting: Podiatry

## 2015-05-29 ENCOUNTER — Ambulatory Visit (INDEPENDENT_AMBULATORY_CARE_PROVIDER_SITE_OTHER): Payer: Medicare Other | Admitting: Podiatry

## 2015-05-29 VITALS — BP 136/67 | HR 63

## 2015-05-29 DIAGNOSIS — M79606 Pain in leg, unspecified: Secondary | ICD-10-CM

## 2015-05-29 DIAGNOSIS — B351 Tinea unguium: Secondary | ICD-10-CM | POA: Diagnosis not present

## 2015-05-29 DIAGNOSIS — L600 Ingrowing nail: Secondary | ICD-10-CM

## 2015-05-29 NOTE — Progress Notes (Signed)
Subjective: 82 year old female presents complaining of painful thick toe nails on both feet. Also 4th digit left foot curls under and hurts to walk.   Objective: Digital corn 2nd medial right painful.  Thick dystrophic nails x 10. Contracted digit with pain upon ambulation 4th digit left.  No abnormal edema or erythema noted. Pedal pulses are palpable. All epicritic and tactile sensations grossly intact.   Assessment: Onychomycosis x 10. Hammer toe deformity 2 right with digital corn and 4 left.  Plan: Debrided all nails and digital corn.  Return in 3 months or as needed

## 2015-05-29 NOTE — Patient Instructions (Signed)
Seen for hypertrophic nails. All nails debrided. Return in 3 months or as needed.  

## 2015-06-04 DIAGNOSIS — M79673 Pain in unspecified foot: Secondary | ICD-10-CM

## 2015-10-18 ENCOUNTER — Encounter: Payer: Self-pay | Admitting: Cardiovascular Disease

## 2015-10-18 ENCOUNTER — Encounter (INDEPENDENT_AMBULATORY_CARE_PROVIDER_SITE_OTHER): Payer: Self-pay

## 2015-10-18 ENCOUNTER — Ambulatory Visit (INDEPENDENT_AMBULATORY_CARE_PROVIDER_SITE_OTHER): Payer: Medicare Other | Admitting: Cardiovascular Disease

## 2015-10-18 VITALS — BP 121/68 | HR 75 | Ht 59.0 in | Wt 106.4 lb

## 2015-10-18 DIAGNOSIS — I2510 Atherosclerotic heart disease of native coronary artery without angina pectoris: Secondary | ICD-10-CM

## 2015-10-18 DIAGNOSIS — E785 Hyperlipidemia, unspecified: Secondary | ICD-10-CM

## 2015-10-18 DIAGNOSIS — Z79899 Other long term (current) drug therapy: Secondary | ICD-10-CM

## 2015-10-18 DIAGNOSIS — I10 Essential (primary) hypertension: Secondary | ICD-10-CM

## 2015-10-18 NOTE — Patient Instructions (Signed)
Medication Instructions: Dr Sallyanne Kuster recommends that you continue on your current medications as directed. Please refer to the Current Medication list given to you today.  Labwork: Your physician recommends that you return for lab work at your convenience - FASTING.  Testing/Procedures: NONE ORDERED  Follow-up: Dr Sallyanne Kuster recommends that you schedule a follow-up appointment in 1 year. You will receive a reminder letter in the mail two months in advance. If you don't receive a letter, please call our office to schedule the follow-up appointment.  If you need a refill on your cardiac medications before your next appointment, please call your pharmacy.

## 2015-10-18 NOTE — Progress Notes (Signed)
Cardiology Office Note    Date:  10/19/2015   ID:  Theresa Crosby, DOB 1932/12/22, MRN [id number]  PCP:  Pcp Not In System  Cardiologist:   Sanda Klein, MD   Chief Complaint  Patient presents with  . Follow-up    pt c/o sob on exertion    History of Present Illness:  Theresa Crosby is a 82 y.o. female with coronary artery disease status post bypass surgery in 2013([place], LIMA to LAD, SVG-OM 2, SVG-diagonal, SVG-RCA , August 2013), hypertension and hyperlipidemia returning for follow-up. She has recently had some problems with sinus congestion and is taking an antibiotic (this seems to be a repetitive problem this time of year). 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 She denies any issues with dyspnea or shortness of breath either at rest or with exertion. She has not had claudication, focal neurological complaints, palpitations or syncope. She has occasional ankle edema. She denies claudication. She continues to complain about stinging discomfort at the keloid sternotomy scar. She has occasional problems with back pain (has undergone lumbar spine stenosis in 1985 and 2013).    Past Medical History:  Diagnosis Date  . Abnormal nuclear stress test 10/05/2011  . Anxiety    2005- related to care of aging parent   . Arthritis    degeneration - spondylosis, OA - all over body,   . Blood transfusion    postop- back surg. 1987  . Bunion   . CAD (coronary artery disease)significant diseas with 99% LAD, 80% 1st diag, 80% LCX 10/05/2011  . Chest pain at rest associated with dyspnea 10/05/2011  . GERD (gastroesophageal reflux disease)   . Hyperlipemia   . Hypertension    ireg. heartbeat, no referral made to cardiologist , sees [facility]. Med.   . Neuromuscular disorder (HCC)    burning- L leg , numbing of toes   . S/P CABG x 4, LIMA-LAD, SVG-OM2, SVG-diag, SVG-RCA secondary to symptomatic CAD and positive stress test 10/05/2011  . Shortness of breath       Past Surgical History:  Procedure Laterality Date  . ABDOMINAL HYSTERECTOMY     partial hysterectomy- 1972  . BACK SURGERY     1987-   . CARDIAC CATHETERIZATION  10/12/2011   high-grade ca+ prox. LAD diagonal branch,higly diseased diagonal branch & obtuse marginal branch  . CORONARY ARTERY BYPASS GRAFT  10/04/2011   Procedure: CORONARY ARTERY BYPASS GRAFTING (CABG);  Surgeon: Gaye Pollack, MD;  Location: [facility];  Service: Open Heart Surgery;  Laterality: N/A;  CABG x four using left internal mammary artery and right leg greater saphenous vein   . EYE SURGERY     cataracts removed- bilateral, no IOL  . FOOT SURGERY Right January 2013   HT 5  . LEFT HEART CATHETERIZATION WITH CORONARY ANGIOGRAM N/A 10/02/2011   Procedure: LEFT HEART CATHETERIZATION WITH CORONARY ANGIOGRAM;  Surgeon: Lorretta Harp, MD;  Location: [facility] CATH LAB;  Service: Cardiovascular;  Laterality: N/A;  . LUMBAR LAMINECTOMY/DECOMPRESSION MICRODISCECTOMY  03/04/2011   Procedure: LUMBAR LAMINECTOMY/DECOMPRESSION MICRODISCECTOMY;  Surgeon: Hosie Spangle, MD;  Location: [facility];  Service: Neurosurgery;  Laterality: N/A;  Thoracic Ten-Thoracic Twelve Thoracic Laminectomy  . NM MYOVIEW LTD  09/15/2011   mild ischemia mid anterior & apical anterior region.EF: 86%  . TOE SURGERY     R foot- removed calus between 5-4  . TUBAL LIGATION      Current Medications: Outpatient Medications  Prior to Visit  Medication Sig Dispense Refill  . albuterol (PROVENTIL HFA;VENTOLIN HFA) 108 (90 BASE) MCG/ACT inhaler Inhale 1-2 puffs into the lungs every 6 (six) hours as needed for wheezing or shortness of breath.    Marland Kitchen amLODipine (NORVASC) 5 MG tablet Take 5 mg by mouth daily.      Marland Kitchen aspirin EC 81 MG tablet Take 1 tablet (81 mg total) by mouth daily. 100 tablet 0  . brimonidine (ALPHAGAN P) 0.1 % SOLN Place 1 drop into both eyes 2 (two) times daily.      . Calcium-Vitamin D-Vitamin K 500-100-40 MG-UNT-MCG CHEW Chew 1 tablet by mouth  2 (two) times daily.      . cetirizine (ZYRTEC ALLERGY) 10 MG tablet Take 1 tablet (10 mg total) by mouth daily. 30 tablet 11  . Cholecalciferol (VITAMIN D3) 1000 UNITS CAPS Take 2,000 Units by mouth every morning.      . fluticasone (FLONASE) 50 MCG/ACT nasal spray Place 2 sprays into the nose as needed for rhinitis.    Marland Kitchen latanoprost (XALATAN) 0.005 % ophthalmic solution Place 1 drop into both eyes at bedtime.      . metoprolol tartrate (LOPRESSOR) 25 MG tablet Take 12.5 mg by mouth 2 (two) times daily.    . pantoprazole (PROTONIX) 40 MG tablet Take 40 mg by mouth daily.    . rosuvastatin (CRESTOR) 10 MG tablet Take 1 tablet (10 mg total) by mouth daily. 30 tablet 11  . cycloSPORINE (RESTASIS) 0.05 % ophthalmic emulsion Place 1 drop into both eyes 2 (two) times daily.    Marland Kitchen HYDROcodone-homatropine (HYCODAN) 5-1.5 MG/5ML syrup Take 5 mLs by mouth every 6 (six) hours as needed for cough. (Patient not taking: Reported on 10/18/2015) 120 mL 0   No facility-administered medications prior to visit.      Allergies:   Aspirin and Lactose intolerance (gi)   Social History   Social History  . Marital status: Widowed    Spouse name: N/A  . Number of children: N/A  . Years of education: N/A   Social History Main Topics  . Smoking status: Never Smoker  . Smokeless tobacco: Never Used  . Alcohol use No  . Drug use: No  . Sexual activity: Not Asked   Other Topics Concern  . None   Social History Narrative  . None     Family History:  The patient's \ family history includes Heart attack in her brother, father, and mother; Hypertension in her father.   ROS:   Please see the history of present illness.    ROS All other systems reviewed and are negative.   PHYSICAL EXAM:   VS:  BP 121/68 (BP Location: Right Arm, Patient Position: Sitting, Cuff Size: Normal)   Pulse 75   Ht 4\' 11"  (1.499 m)   Wt 106 lb 6.4 oz (48.3 kg)   SpO2 98%   BMI 21.49 kg/m    GEN: Well nourished, well developed,  in no acute distress  HEENT: normal  Neck: no JVD, carotid bruits, or masses Cardiac: \RRR; no murmurs, rubs, or gallops,no edema , keloid mid chest scar Respiratory:  clear to auscultation bilaterally, normal work of breathing GI: soft, nontender, nondistended, + BS MS: no deformity or atrophy  Skin: warm and dry, no rash Neuro:  Alert and Oriented x 3, Strength and sensation are intact Psych: euthymic mood, full affect  Wt Readings from Last 3 Encounters:  10/18/15 106 lb 6.4 oz (48.3 kg)  02/06/15 105 lb (  47.6 kg)  01/28/15 105 lb 9.6 oz (47.9 kg)      Studies/Labs Reviewed:   EKG:  EKG is  ordered today.  The ekg ordered today demonstrates Sinus rhythm transitioning to ectopic atrial rhythm with normal rate in the 70s, nonspecific T-wave changes, QTc 457 ms  Recent Labs: 12/19/2014: TSH 0.721 01/28/2015: ALT 17; BUN 9; Creat 0.65; Potassium 4.4; Sodium 139 02/03/2015: B Natriuretic Peptide 45.8; Hemoglobin 13.8; Platelets 257   Lipid Panel    Component Value Date/Time   CHOL 232 (H) 11/15/2014 1640   TRIG 102 11/15/2014 1640   HDL 64 11/15/2014 1640   CHOLHDL 3.6 11/15/2014 1640   VLDL 20 11/15/2014 1640   LDLCALC 148 (H) 11/15/2014 1640     ASSESSMENT:    1. Coronary artery disease involving native coronary artery of native heart without angina pectoris   2. Dyslipidemia   3. Essential hypertension   4. Medication management      PLAN:  In order of problems listed above:  1. CAD: Feels well, no symptoms of angina pectoris, normal left ventricular systolic function, risk factors generally well addressed. 2. HLP: On statin, recheck labs today. 3. HTN: Blood pressure well controlled. Mild ankle swelling could be a side effect of amlodipine, is not worrisome.    Medication Adjustments/Labs and Tests Ordered: Current medicines are reviewed at length with the patient today.  Concerns regarding medicines are outlined above.  Medication changes, Labs and Tests  ordered today are listed in the Patient Instructions below. Patient Instructions  Medication Instructions: Dr Sallyanne Kuster recommends that you continue on your current medications as directed. Please refer to the Current Medication list given to you today.  Labwork: Your physician recommends that you return for lab work at your convenience - FASTING.  Testing/Procedures: NONE ORDERED  Follow-up: Dr Sallyanne Kuster recommends that you schedule a follow-up appointment in 1 year. You will receive a reminder letter in the mail two months in advance. If you don't receive a letter, please call our office to schedule the follow-up appointment.  If you need a refill on your cardiac medications before your next appointment, please call your pharmacy.    Signed, Sanda Klein, MD  10/19/2015 10:41 AM    [facility] [address] [place], [place], [place]  [address] Phone: [phone]; Fax: [phone]

## 2015-10-31 ENCOUNTER — Telehealth: Payer: Self-pay | Admitting: Cardiovascular Disease

## 2015-10-31 LAB — COMPREHENSIVE METABOLIC PANEL
ALT: 19 U/L (ref 6–29)
AST: 21 U/L (ref 10–35)
Albumin: 4.6 g/dL (ref 3.6–5.1)
Alkaline Phosphatase: 85 U/L (ref 33–130)
BUN: 10 mg/dL (ref 7–25)
CO2: 25 mmol/L (ref 20–31)
Calcium: 9.5 mg/dL (ref 8.6–10.4)
Chloride: 105 mmol/L (ref 98–110)
Creat: 0.69 mg/dL (ref 0.60–0.88)
Glucose, Bld: 85 mg/dL (ref 65–99)
Potassium: 4.1 mmol/L (ref 3.5–5.3)
Sodium: 141 mmol/L (ref 135–146)
Total Bilirubin: 0.5 mg/dL (ref 0.2–1.2)
Total Protein: 6.8 g/dL (ref 6.1–8.1)

## 2015-10-31 LAB — LIPID PANEL
Cholesterol: 148 mg/dL (ref 125–200)
HDL: 77 mg/dL (ref >=46)
LDL Cholesterol: 59 mg/dL (ref <130)
Total CHOL/HDL Ratio: 1.9 Ratio (ref <=5.0)
Triglycerides: 59 mg/dL (ref <150)
VLDL: 12 mg/dL (ref <30)

## 2015-10-31 NOTE — Telephone Encounter (Signed)
Returning your call from this morning. °

## 2015-10-31 NOTE — Telephone Encounter (Signed)
Notes Recorded by Dionne Bucy Shemicka Cohrs, CMA on 10/31/2015 at 4:47 PM EDT Called patient with results. Patient verbalized understanding.

## 2015-11-13 ENCOUNTER — Ambulatory Visit: Payer: Medicare Other | Admitting: Cardiovascular Disease

## 2015-12-10 ENCOUNTER — Emergency Department (HOSPITAL_BASED_OUTPATIENT_CLINIC_OR_DEPARTMENT_OTHER)
Admission: EM | Admit: 2015-12-10 | Discharge: 2015-12-10 | Disposition: A | Discharge status: Home / Self Care | Payer: Worker's Compensation | Attending: Emergency Medicine | Admitting: Emergency Medicine

## 2015-12-10 ENCOUNTER — Encounter (HOSPITAL_BASED_OUTPATIENT_CLINIC_OR_DEPARTMENT_OTHER): Payer: Self-pay | Admitting: Emergency Medicine

## 2015-12-10 ENCOUNTER — Emergency Department (HOSPITAL_BASED_OUTPATIENT_CLINIC_OR_DEPARTMENT_OTHER): Payer: Worker's Compensation

## 2015-12-10 DIAGNOSIS — Z79899 Other long term (current) drug therapy: Secondary | ICD-10-CM | POA: Insufficient documentation

## 2015-12-10 DIAGNOSIS — Y999 Unspecified external cause status: Secondary | ICD-10-CM | POA: Insufficient documentation

## 2015-12-10 DIAGNOSIS — W010XXA Fall on same level from slipping, tripping and stumbling without subsequent striking against object, initial encounter: Secondary | ICD-10-CM | POA: Insufficient documentation

## 2015-12-10 DIAGNOSIS — Y9301 Activity, walking, marching and hiking: Secondary | ICD-10-CM | POA: Insufficient documentation

## 2015-12-10 DIAGNOSIS — I10 Essential (primary) hypertension: Secondary | ICD-10-CM | POA: Insufficient documentation

## 2015-12-10 DIAGNOSIS — Y92219 Unspecified school as the place of occurrence of the external cause: Secondary | ICD-10-CM | POA: Diagnosis not present

## 2015-12-10 DIAGNOSIS — Z7982 Long term (current) use of aspirin: Secondary | ICD-10-CM | POA: Insufficient documentation

## 2015-12-10 DIAGNOSIS — S6391XA Sprain of unspecified part of right wrist and hand, initial encounter: Secondary | ICD-10-CM

## 2015-12-10 DIAGNOSIS — W19XXXA Unspecified fall, initial encounter: Secondary | ICD-10-CM

## 2015-12-10 DIAGNOSIS — I2510 Atherosclerotic heart disease of native coronary artery without angina pectoris: Secondary | ICD-10-CM | POA: Insufficient documentation

## 2015-12-10 DIAGNOSIS — S6991XA Unspecified injury of right wrist, hand and finger(s), initial encounter: Secondary | ICD-10-CM | POA: Diagnosis present

## 2015-12-10 NOTE — Discharge Instructions (Signed)
Rest. Ice for 20 minutes every 2 hours while awake for the next 2 days.  Ibuprofen 400 mg every 6 hours as needed for pain.  Follow-up with your primary Dr. if you're not improving in the next week, and return to the ER if symptoms significantly worsen or change.

## 2015-12-10 NOTE — ED Notes (Signed)
Patient transported to X-ray 

## 2015-12-10 NOTE — ED Triage Notes (Signed)
Pt tripped and fell today landing on R hand. Pt reports R hand pain, denies LOC, denies hitting head.

## 2015-12-10 NOTE — ED Provider Notes (Signed)
[facility] Provider Note   CSN: [id number] Arrival date & time: 12/10/15  1640     History   Chief Complaint Chief Complaint  Patient presents with  . Fall    HPI Theresa Crosby is a 82 y.o. female.  Patient is an 82 year old female with past medical history of CAD with CABG, hypertension, arthritis. She presents for evaluation after a fall. 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 She is complaining of pain in her right hand and wrist. She does report landing with her cheek on the ground, but denies any significant head trauma, headache, or neck pain. She takes a baby aspirin daily, however no Coumadin or Xarelto. She denies any loss of consciousness, syncope, or dizziness. This appears to be a mechanical fall.   The history is provided by the patient.  Fall  This is a new problem. The current episode started 3 to 5 hours ago. The problem occurs constantly. The problem has not changed since onset.Nothing aggravates the symptoms. Nothing relieves the symptoms.    Past Medical History:  Diagnosis Date  . Abnormal nuclear stress test 10/05/2011  . Anxiety    2005- related to care of aging parent   . Arthritis    degeneration - spondylosis, OA - all over body,   . Blood transfusion    postop- back surg. 1987  . Bunion   . CAD (coronary artery disease)significant diseas with 99% LAD, 80% 1st diag, 80% LCX 10/05/2011  . Chest pain at rest associated with dyspnea 10/05/2011  . GERD (gastroesophageal reflux disease)   . Hyperlipemia   . Hypertension    ireg. heartbeat, no referral made to cardiologist , sees [facility]. Med.   . Neuromuscular disorder (HCC)    burning- L leg , numbing of toes   . S/P CABG x 4, LIMA-LAD, SVG-OM2, SVG-diag, SVG-RCA secondary to symptomatic CAD and positive stress test 10/05/2011  . Shortness of breath     Patient Active Problem List   Diagnosis Date Noted  . Left shoulder pain  12/10/2014  . Pain in toe of left foot 02/13/2014  . Pain in lower limb 12/18/2013  . Ingrown nail 11/30/2012  . Pain in joint, ankle and foot 08/30/2012  . Onychomycosis due to dermatophyte 05/30/2012  . Chest pain 11/02/2011  . Oral thrush 11/02/2011  . Neuropathy, lower extremities 10/07/2011  . HTN (hypertension) 10/07/2011  . Dyslipidemia 10/07/2011  . Chest pain at rest associated with dyspnea 10/05/2011  . Abnormal nuclear stress test 10/05/2011  . CAD s/p CABG x 4 2013 10/05/2011  . S/P CABG x 4, LIMA-LAD, SVG-OM2, SVG-diag, SVG-RCA  10/04/11 10/05/2011    Past Surgical History:  Procedure Laterality Date  . ABDOMINAL HYSTERECTOMY     partial hysterectomy- 1972  . BACK SURGERY     1987-   . CARDIAC CATHETERIZATION  10/12/2011   high-grade ca+ prox. LAD diagonal branch,higly diseased diagonal branch & obtuse marginal branch  . CORONARY ARTERY BYPASS GRAFT  10/04/2011   Procedure: CORONARY ARTERY BYPASS GRAFTING (CABG);  Surgeon: Gaye Pollack, MD;  Location: [facility];  Service: Open Heart Surgery;  Laterality: N/A;  CABG x four using left internal mammary artery and right leg greater saphenous vein   . EYE SURGERY     cataracts removed- bilateral, no IOL  . FOOT SURGERY Right January 2013   HT 5  . LEFT HEART CATHETERIZATION WITH CORONARY ANGIOGRAM N/A 10/02/2011  Procedure: LEFT HEART CATHETERIZATION WITH CORONARY ANGIOGRAM;  Surgeon: Lorretta Harp, MD;  [facility] CATH LAB;  Service: Cardiovascular;  Laterality: N/A;  . LUMBAR LAMINECTOMY/DECOMPRESSION MICRODISCECTOMY  03/04/2011   Procedure: LUMBAR LAMINECTOMY/DECOMPRESSION MICRODISCECTOMY;  Surgeon: Hosie Spangle, MD;  Location: [facility] OR[facility];  Service: Neurosurgery;  Laterality: N/A;  Thoracic Ten-Thoracic Twelve Thoracic Laminectomy  . NM MYOVIEW LTD  09/15/2011   mild ischemia mid anterior & apical anterior region.EF: 86%  . TOE SURGERY     R foot- removed calus between 5-4  . TUBAL LIGATION      OB History      No data available       Home Medications    Prior to Admission medications   Medication Sig Start Date End Date Taking? Authorizing Provider  albuterol (PROVENTIL HFA;VENTOLIN HFA) 108 (90 BASE) MCG/ACT inhaler Inhale 1-2 puffs into the lungs every 6 (six) hours as needed for wheezing or shortness of breath.   Yes Historical Provider, MD  amLODipine (NORVASC) 5 MG tablet Take 5 mg by mouth daily.     Yes Historical Provider, MD  aspirin EC 81 MG tablet Take 1 tablet (81 mg total) by mouth daily. 11/02/11  Yes Monika Salk, MD  metoprolol tartrate (LOPRESSOR) 25 MG tablet Take 12.5 mg by mouth 2 (two) times daily.   Yes Historical Provider, MD  pantoprazole (PROTONIX) 40 MG tablet Take 40 mg by mouth daily.   Yes Historical Provider, MD  amoxicillin-clavulanate (AUGMENTIN) 875-125 MG tablet Take 1 tablet by mouth 2 (two) times daily.    Historical Provider, MD  Biotin 1 MG CAPS Take 1 capsule by mouth 3 (three) times daily.    Historical Provider, MD  brimonidine (ALPHAGAN P) 0.1 % SOLN Place 1 drop into both eyes 2 (two) times daily.      Historical Provider, MD  Calcium-Vitamin D-Vitamin K 500-100-40 MG-UNT-MCG CHEW Chew 1 tablet by mouth 2 (two) times daily.      Historical Provider, MD  cetirizine (ZYRTEC ALLERGY) 10 MG tablet Take 1 tablet (10 mg total) by mouth daily. 01/28/15   Almyra Deforest, PA  Cholecalciferol (VITAMIN D3) 1000 UNITS CAPS Take 2,000 Units by mouth every morning.      Historical Provider, MD  fluticasone (FLONASE) 50 MCG/ACT nasal spray Place 2 sprays into the nose as needed for rhinitis.    Historical Provider, MD  latanoprost (XALATAN) 0.005 % ophthalmic solution Place 1 drop into both eyes at bedtime.      Historical Provider, MD  methylPREDNISolone (MEDROL) 4 MG tablet Take 4 mg by mouth daily.    Historical Provider, MD  rosuvastatin (CRESTOR) 10 MG tablet Take 1 tablet (10 mg total) by mouth daily. 01/28/15   Almyra Deforest, PA    Family History Family History  Problem  Relation Age of Onset  . Heart attack Mother   . Heart attack Father   . Hypertension Father   . Heart attack Brother   . Anesthesia problems Neg Hx   . Hypotension Neg Hx   . Malignant hyperthermia Neg Hx   . Pseudochol deficiency Neg Hx     Social History Social History  Substance Use Topics  . Smoking status: Never Smoker  . Smokeless tobacco: Never Used  . Alcohol use No     Allergies   Aspirin and Lactose intolerance (gi)   Review of Systems Review of Systems  All other systems reviewed and are negative.    Physical Exam Updated Vital Signs  BP 167/93 (BP Location: Left Arm)   Pulse 87   Temp 97.9 F (36.6 C) (Oral)   Resp 16   Ht 4\' 11"  (1.499 m)   Wt 100 lb (45.4 kg)   SpO2 99%   BMI 20.20 kg/m   Physical Exam  Constitutional: She is oriented to person, place, and time. She appears well-developed and well-nourished. No distress.  HENT:  Head: Normocephalic and atraumatic.  Neck: Normal range of motion. Neck supple.  Cardiovascular: Normal rate and regular rhythm.  Exam reveals no gallop and no friction rub.   No murmur heard. Pulmonary/Chest: Effort normal and breath sounds normal. No respiratory distress. She has no wheezes.  Abdominal: Soft. Bowel sounds are normal. She exhibits no distension. There is no tenderness.  Musculoskeletal: Normal range of motion.  The right hand and wrist appear grossly normal. There is no obvious deformity or swelling. She has good range of motion of the wrist and hand. There is some swelling over the dorsum of the proximal hand and wrist. Distal PMS is intact.  Neurological: She is alert and oriented to person, place, and time.  Skin: Skin is warm and dry. She is not diaphoretic.  Nursing note and vitals reviewed.    ED Treatments / Results  Labs (all labs ordered are listed, but only abnormal results are displayed) Labs Reviewed - No data to display  EKG  EKG Interpretation None       Radiology Dg Hand  Complete Right  Result Date: 12/10/2015 CLINICAL DATA:  Right hand pain since a fall this morning. Initial encounter. EXAM: RIGHT HAND - COMPLETE 3+ VIEW COMPARISON:  None. FINDINGS: No acute bony or joint abnormality is seen. The patient has severe first [place] osteoarthritis. Soft tissues are unremarkable. IMPRESSION: No acute abnormality. Severe first [facility] osteoarthritis. Electronically Signed   By: Inge Rise M.D.   On: 12/10/2015 17:18    Procedures Procedures (including critical care time)  Medications Ordered in ED Medications - No data to display   Initial Impression / Assessment and Plan / ED Course  I have reviewed the triage vital signs and the nursing notes.  Pertinent labs & imaging results that were available during my care of the patient were reviewed by me and considered in my medical decision making (see chart for details).  Clinical Course    X-rays are negative of the hand and wrist. The remainder of her physical examination is unremarkable and I do not feel as though any further imaging is indicated. She will be discharged with anti-inflammatories, rest, ice, and when necessary follow-up.  Final Clinical Impressions(s) / ED Diagnoses   Final diagnoses:  None    New Prescriptions New Prescriptions   No medications on file     Veryl Speak, MD 12/10/15 1746

## 2016-01-07 DIAGNOSIS — M79673 Pain in unspecified foot: Secondary | ICD-10-CM

## 2016-01-15 ENCOUNTER — Encounter: Payer: Self-pay | Admitting: Podiatry

## 2016-01-15 ENCOUNTER — Ambulatory Visit (INDEPENDENT_AMBULATORY_CARE_PROVIDER_SITE_OTHER): Payer: Medicare Other | Admitting: Podiatry

## 2016-01-15 VITALS — BP 138/69 | HR 75

## 2016-01-15 DIAGNOSIS — M79606 Pain in leg, unspecified: Secondary | ICD-10-CM

## 2016-01-15 DIAGNOSIS — B351 Tinea unguium: Secondary | ICD-10-CM | POA: Diagnosis not present

## 2016-01-15 NOTE — Patient Instructions (Signed)
Seen for hypertrophic nails. All nails and corns debrided. Return in 3 months or as needed.  

## 2016-01-15 NOTE — Progress Notes (Signed)
Subjective: 82 year old female presents complaining of painful thick toe nails on both feet.  Also has a painful corn on 2nd toe right foot that hurts.  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   Objective: Digital corn 2nd medial right painful.  Thick dystrophic nails x 10. Contracted digit with pain upon ambulation 4th digit left.  No abnormal edema or erythema noted. Pedal pulses are palpable. All epicritic and tactile sensations grossly intact.   Assessment: Onychomycosis x 10. Hammer toe deformity 2 right with digital corn and 4 left.  Plan: Debrided all nails and digital corn.  Return in 3 months or as needed

## 2016-02-18 ENCOUNTER — Emergency Department (HOSPITAL_BASED_OUTPATIENT_CLINIC_OR_DEPARTMENT_OTHER): Payer: Medicare HMO

## 2016-02-18 ENCOUNTER — Emergency Department (HOSPITAL_BASED_OUTPATIENT_CLINIC_OR_DEPARTMENT_OTHER)
Admission: EM | Admit: 2016-02-18 | Discharge: 2016-02-18 | Disposition: A | Discharge status: Home / Self Care | Payer: Medicare HMO | Attending: Physician Assistant | Admitting: Physician Assistant

## 2016-02-18 ENCOUNTER — Encounter (HOSPITAL_BASED_OUTPATIENT_CLINIC_OR_DEPARTMENT_OTHER): Payer: Self-pay | Admitting: *Deleted

## 2016-02-18 DIAGNOSIS — I2510 Atherosclerotic heart disease of native coronary artery without angina pectoris: Secondary | ICD-10-CM | POA: Insufficient documentation

## 2016-02-18 DIAGNOSIS — R300 Dysuria: Secondary | ICD-10-CM | POA: Insufficient documentation

## 2016-02-18 DIAGNOSIS — Z951 Presence of aortocoronary bypass graft: Secondary | ICD-10-CM | POA: Insufficient documentation

## 2016-02-18 DIAGNOSIS — I10 Essential (primary) hypertension: Secondary | ICD-10-CM | POA: Diagnosis not present

## 2016-02-18 DIAGNOSIS — K5900 Constipation, unspecified: Secondary | ICD-10-CM | POA: Diagnosis not present

## 2016-02-18 DIAGNOSIS — M25551 Pain in right hip: Secondary | ICD-10-CM | POA: Diagnosis present

## 2016-02-18 DIAGNOSIS — Z7982 Long term (current) use of aspirin: Secondary | ICD-10-CM | POA: Diagnosis not present

## 2016-02-18 LAB — URINALYSIS, ROUTINE W REFLEX MICROSCOPIC
Bilirubin Urine: NEGATIVE
Glucose, UA: NEGATIVE mg/dL
Hgb urine dipstick: NEGATIVE
Ketones, ur: NEGATIVE mg/dL
Nitrite: NEGATIVE
Protein, ur: NEGATIVE mg/dL
Specific Gravity, Urine: 1.011 (ref 1.005–1.030)
pH: 7.5 (ref 5.0–8.0)

## 2016-02-18 LAB — URINALYSIS, MICROSCOPIC (REFLEX)

## 2016-02-18 MED ORDER — OXYBUTYNIN CHLORIDE ER 10 MG PO TB24: 10.0000 mg | ORAL_TABLET | Freq: Every day | ORAL | 0 refills | Status: DC

## 2016-02-18 MED ORDER — ACETAMINOPHEN 325 MG PO TABS
650.0000 mg | ORAL_TABLET | Freq: Once | ORAL | Status: AC
  Administered 2016-02-18: 650 mg via ORAL
  Filled 2016-02-18: qty 2

## 2016-02-18 MED ORDER — POLYETHYLENE GLYCOL 3350 17 G PO PACK: 17.0000 g | PACK | Freq: Every day | ORAL | 0 refills | Status: DC

## 2016-02-18 NOTE — ED Notes (Signed)
Pt was called twice to be pulled to a room and no answer.

## 2016-02-18 NOTE — Discharge Instructions (Signed)
You have constipation. This could be causing your symptoms with any rectum and near vagina. Please take MiraLAX, up to 3 times a day until your stooling softly.   We weighed her follow-up with Dr. Sherwood Gambler, and her primary care physician. Your CAT scan showed that you have narrowing in your back, this could need surgery. You will need to have an MRI.

## 2016-02-18 NOTE — ED Notes (Signed)
Pt left with out telling any one.

## 2016-02-18 NOTE — ED Provider Notes (Signed)
[facility] DEPT [facility] Provider Note   CSN: [id number] Arrival date & time: 02/18/16  1528   By signing my name below, I, Avnee Patel, attest that this documentation has been prepared under the direction and in the presence of Akacia Boltz Julio Alm, MD  Electronically Signed: Delton Prairie, ED Scribe. 02/18/16. 8:35 PM.   History   Chief Complaint Chief Complaint  Patient presents with  . Dysuria    The history is provided by the patient. No language interpreter was used.   HPI Comments:  RATZY KINGSMORE is a 83 y.o. female who presents to the Emergency Department complaining of sudden onset, persistent right sided pain to her right hip, right buttock and right flank x several weeks (began 02/03/16). She also reports rectal pain/spasms, dysuria and nausea. Her dysuria is worse when she needs to have a bowel movement and her pain is worse with laughing, coughing, when sleeping on her right side and upon palpation. Pt has taken ibuprofen and tylenol with no relief. Pt denies any other associated symptoms and any other modifying factors at this time.  .  Past Medical History:  Diagnosis Date  . Abnormal nuclear stress test 10/05/2011  . Anxiety    2005- related to care of aging parent   . Arthritis    degeneration - spondylosis, OA - all over body,   . Blood transfusion    postop- back surg. 1987  . Bunion   . CAD (coronary artery disease)significant diseas with 99% LAD, 80% 1st diag, 80% LCX 10/05/2011  . Chest pain at rest associated with dyspnea 10/05/2011  . GERD (gastroesophageal reflux disease)   . Hyperlipemia   . Hypertension    ireg. heartbeat, no referral made to cardiologist , sees [facility]. Med.   . Neuromuscular disorder (HCC)    burning- L leg , numbing of toes   . S/P CABG x 4, LIMA-LAD, SVG-OM2, SVG-diag, SVG-RCA secondary to symptomatic CAD and positive stress test 10/05/2011  . Shortness of breath     Patient Active Problem List   Diagnosis Date Noted  . Left  shoulder pain 12/10/2014  . Pain in toe of left foot 02/13/2014  . Pain in lower limb 12/18/2013  . Ingrown nail 11/30/2012  . Pain in joint, ankle and foot 08/30/2012  . Onychomycosis due to dermatophyte 05/30/2012  . Chest pain 11/02/2011  . Oral thrush 11/02/2011  . Neuropathy, lower extremities 10/07/2011  . HTN (hypertension) 10/07/2011  . Dyslipidemia 10/07/2011  . Chest pain at rest associated with dyspnea 10/05/2011  . Abnormal nuclear stress test 10/05/2011  . CAD s/p CABG x 4 2013 10/05/2011  . S/P CABG x 4, LIMA-LAD, SVG-OM2, SVG-diag, SVG-RCA  10/04/11 10/05/2011    Past Surgical History:  Procedure Laterality Date  . ABDOMINAL HYSTERECTOMY     partial hysterectomy- 1972  . BACK SURGERY     1987-   . CARDIAC CATHETERIZATION  10/12/2011   high-grade ca+ prox. LAD diagonal branch,higly diseased diagonal branch & obtuse marginal branch  . CORONARY ARTERY BYPASS GRAFT  10/04/2011   Procedure: CORONARY ARTERY BYPASS GRAFTING (CABG);  Surgeon: Gaye Pollack, MD;  Location: [facility];  Service: Open Heart Surgery;  Laterality: N/A;  CABG x four using left internal mammary artery and right leg greater saphenous vein   . EYE SURGERY     cataracts removed- bilateral, no IOL  . FOOT SURGERY Right January 2013   HT 5  . LEFT HEART CATHETERIZATION WITH CORONARY ANGIOGRAM N/A 10/02/2011  Procedure: LEFT HEART CATHETERIZATION WITH CORONARY ANGIOGRAM;  Surgeon: Lorretta Harp, MD;  Location: [facility] CATH LAB;  Service: Cardiovascular;  Laterality: N/A;  . LUMBAR LAMINECTOMY/DECOMPRESSION MICRODISCECTOMY  03/04/2011   Procedure: LUMBAR LAMINECTOMY/DECOMPRESSION MICRODISCECTOMY;  Surgeon: Hosie Spangle, MD;  Location: [facility] NEURO ORS;  Service: Neurosurgery;  Laterality: N/A;  Thoracic Ten-Thoracic Twelve Thoracic Laminectomy  . NM MYOVIEW LTD  09/15/2011   mild ischemia mid anterior & apical anterior region.EF: 86%  . TOE SURGERY     R foot- removed calus between 5-4  . TUBAL LIGATION       OB History    No data available       Home Medications    Prior to Admission medications   Medication Sig Start Date End Date Taking? Authorizing Provider  albuterol (PROVENTIL HFA;VENTOLIN HFA) 108 (90 BASE) MCG/ACT inhaler Inhale 1-2 puffs into the lungs every 6 (six) hours as needed for wheezing or shortness of breath.    Historical Provider, MD  amLODipine (NORVASC) 5 MG tablet Take 5 mg by mouth daily.      Historical Provider, MD  amoxicillin-clavulanate (AUGMENTIN) 875-125 MG tablet Take 1 tablet by mouth 2 (two) times daily.    Historical Provider, MD  aspirin EC 81 MG tablet Take 1 tablet (81 mg total) by mouth daily. 11/02/11   Monika Salk, MD  Biotin 1 MG CAPS Take 1 capsule by mouth 3 (three) times daily.    Historical Provider, MD  brimonidine (ALPHAGAN P) 0.1 % SOLN Place 1 drop into both eyes 2 (two) times daily.      Historical Provider, MD  Calcium-Vitamin D-Vitamin K 500-100-40 MG-UNT-MCG CHEW Chew 1 tablet by mouth 2 (two) times daily.      Historical Provider, MD  cetirizine (ZYRTEC ALLERGY) 10 MG tablet Take 1 tablet (10 mg total) by mouth daily. 01/28/15   Almyra Deforest, PA  Cholecalciferol (VITAMIN D3) 1000 UNITS CAPS Take 2,000 Units by mouth every morning.      Historical Provider, MD  fluticasone (FLONASE) 50 MCG/ACT nasal spray Place 2 sprays into the nose as needed for rhinitis.    Historical Provider, MD  latanoprost (XALATAN) 0.005 % ophthalmic solution Place 1 drop into both eyes at bedtime.      Historical Provider, MD  methylPREDNISolone (MEDROL) 4 MG tablet Take 4 mg by mouth daily.    Historical Provider, MD  metoprolol tartrate (LOPRESSOR) 25 MG tablet Take 12.5 mg by mouth 2 (two) times daily.    Historical Provider, MD  pantoprazole (PROTONIX) 40 MG tablet Take 40 mg by mouth daily.    Historical Provider, MD  rosuvastatin (CRESTOR) 10 MG tablet Take 1 tablet (10 mg total) by mouth daily. 01/28/15   Almyra Deforest, PA    Family History Family History   Problem Relation Age of Onset  . Heart attack Mother   . Heart attack Father   . Hypertension Father   . Heart attack Brother   . Anesthesia problems Neg Hx   . Hypotension Neg Hx   . Malignant hyperthermia Neg Hx   . Pseudochol deficiency Neg Hx     Social History Social History  Substance Use Topics  . Smoking status: Never Smoker  . Smokeless tobacco: Never Used  . Alcohol use No     Allergies   Aspirin and Lactose intolerance (gi)   Review of Systems Review of Systems  Genitourinary: Positive for dysuria.  Musculoskeletal: Positive for myalgias.  All other systems reviewed and are  negative.  Physical Exam Updated Vital Signs BP 157/86 (BP Location: Right Arm)   Pulse 79   Temp 97.8 F (36.6 C) (Oral)   Resp 18   Ht 4\' 11"  (1.499 m)   Wt 104 lb (47.2 kg)   SpO2 97%   BMI 21.01 kg/m   Physical Exam  Constitutional: She is oriented to person, place, and time. She appears well-developed and well-nourished. No distress.  HENT:  Head: Normocephalic and atraumatic.  Eyes: Conjunctivae are normal.  Cardiovascular: Normal rate.   Pulmonary/Chest: Effort normal.  Abdominal: She exhibits no distension.  Genitourinary:  Genitourinary Comments: Normal rectal exam. Normal tone. No blood in stool and no hemorrhoids.   Musculoskeletal: She exhibits tenderness.  Tenderness to right lateral wall into the sacrum.  Neurological: She is alert and oriented to person, place, and time.  Normal strength bilateral LE. Normal sensation  Skin: Skin is warm and dry. No rash noted.  Psychiatric: She has a normal mood and affect.  Nursing note and vitals reviewed.  ED Treatments / Results  DIAGNOSTIC STUDIES:  Oxygen Saturation is 97% on RA, normal by my interpretation.    COORDINATION OF CARE:  8:35 PM Discussed treatment plan with pt at bedside and pt agreed to plan.  Labs (all labs ordered are listed, but only abnormal results are displayed) Labs Reviewed   URINALYSIS, ROUTINE W REFLEX MICROSCOPIC - Abnormal; Notable for the following:       Result Value   APPearance CLOUDY (*)    Leukocytes, UA TRACE (*)    All other components within normal limits  URINALYSIS, MICROSCOPIC (REFLEX) - Abnormal; Notable for the following:    Bacteria, UA RARE (*)    Squamous Epithelial / LPF 0-5 (*)    All other components within normal limits    EKG  EKG Interpretation None       Radiology No results found.  Procedures Procedures (including critical care time)  Medications Ordered in ED Medications - No data to display   Initial Impression / Assessment and Plan / ED Course  I have reviewed the triage vital signs and the nursing notes.  Pertinent labs & imaging results that were available during my care of the patient were reviewed by me and considered in my medical decision making (see chart for details).  Clinical Course     Patient is a 83 year old female with odd constellation of symptoms. Patient has pain to the right flank/hip/sacrum. She reports it's worse with movement. No weakness. She also reports she has spasms in her bladder and her rectum. I'm unsure how to put together these symptoms. Patient's urine does not show urinary tract infection. We will get CT to make sure she does not have obstructive stone.    Patient's physical exam is normal. Patient's vital signs are normal. We'll get CT stone. If normal will give her oxybutynin have her follow-up with a primary care physician.   I personally performed the services described in this documentation, which was scribed in my presence. The recorded information has been reviewed and is accurate.    11:14 PM CT shows findings. I don't believe the tenesmus and bladder spasm are related to acute spinal cord, she has no weakness which I would expect first and no incontinence. In addition it has been going on since mid December.  I don;t think that transfer for MRI is indicated given normal  strength, and durcation of symtpoms.  Would have her follow up with PCP TOMORROW, to get  outpatient MRI.   Patient told me she had surgery by Dr. Sherwood Gambler on her upper back and he was supoposed to do her lower back before she was found out that she needed a CABG (4 years ago). Therefore she postpone the surgery and has not had it done. I think this is likely what we are seeing in her back. Patient has no numbness no tingling and no new neuro symptoms. We'll have her follow-up with her primary care physician and [facility].  I think the constipation is causing the tenesmus and could be the bladder spasm as well.   Final Clinical Impressions(s) / ED Diagnoses   Final diagnoses:  None    New Prescriptions New Prescriptions   No medications on file      Khloie Hamada Julio Alm, MD 02/18/16 2337

## 2016-02-18 NOTE — ED Notes (Signed)
Pt to nurse first desk asking about wait time. Pt had been called to treatment room earlier with no answer. Pt returned to system to await available bed.

## 2016-04-15 ENCOUNTER — Encounter: Payer: Self-pay | Admitting: Podiatry

## 2016-04-15 ENCOUNTER — Ambulatory Visit (INDEPENDENT_AMBULATORY_CARE_PROVIDER_SITE_OTHER): Payer: Medicare HMO | Admitting: Podiatry

## 2016-04-15 DIAGNOSIS — B351 Tinea unguium: Secondary | ICD-10-CM | POA: Diagnosis not present

## 2016-04-15 DIAGNOSIS — M79606 Pain in leg, unspecified: Secondary | ICD-10-CM

## 2016-04-15 DIAGNOSIS — M79671 Pain in right foot: Secondary | ICD-10-CM | POA: Diagnosis not present

## 2016-04-15 NOTE — Patient Instructions (Signed)
Seen for hypertrophic nails and calluses. All nails debrided. Return in 3 months or as needed.  

## 2016-04-15 NOTE — Progress Notes (Signed)
Subjective: 83 year old female presents complaining of painful thick toe nails on both feet.  Nails are thick and hurts. Both feet have swelling on top. 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   Objective: Thick dystrophic nails x 10. Hard callused great toe at distal medial right. Forefoot edema bilateral. Pedal pulses are palpable. All epicritic and tactile sensations grossly intact.   Assessment: Onychomycosis x 10. Bilateral forefoot edema from being on feet long hours at work. Hammer toe deformity 2 right with digital corn and 4 left.  Plan: Debrided all nails and digital corn.  Return in 3 months or as needed

## 2016-05-08 ENCOUNTER — Other Ambulatory Visit: Payer: Self-pay | Admitting: Obstetrics and Gynecology

## 2016-05-08 DIAGNOSIS — Z1231 Encounter for screening mammogram for malignant neoplasm of breast: Secondary | ICD-10-CM

## 2016-05-28 ENCOUNTER — Ambulatory Visit
Admission: RE | Admit: 2016-05-28 | Discharge: 2016-05-28 | Disposition: A | Discharge status: Home / Self Care | Payer: Medicare HMO | Source: Ambulatory Visit | Attending: Obstetrics and Gynecology | Admitting: Obstetrics and Gynecology

## 2016-05-28 DIAGNOSIS — Z1231 Encounter for screening mammogram for malignant neoplasm of breast: Secondary | ICD-10-CM

## 2016-06-01 ENCOUNTER — Telehealth: Payer: Self-pay | Admitting: *Deleted

## 2016-06-01 ENCOUNTER — Encounter: Payer: Self-pay | Admitting: Gynecologic Oncology

## 2016-06-01 ENCOUNTER — Telehealth: Payer: Self-pay | Admitting: Gynecologic Oncology

## 2016-06-01 NOTE — Telephone Encounter (Signed)
Patient called back and given the follow information; "this appt on May 7th is a consultation and if surgery is needed it will be discussed then. OR days are on Tuesdays and Thursdays. There will be an exam this day also." Patient stated that she understood.

## 2016-06-01 NOTE — Telephone Encounter (Signed)
Pt has been scheduled to see Dr. Denman George on 5/7 at 930am. Will mail the pt a letter and fax a letter to the referring

## 2016-06-01 NOTE — Telephone Encounter (Signed)
Per patient's voicemail I have returned her call. I have left her a message to call the office back.

## 2016-06-22 ENCOUNTER — Ambulatory Visit: Payer: Self-pay | Admitting: Gynecologic Oncology

## 2016-06-26 ENCOUNTER — Emergency Department (HOSPITAL_COMMUNITY): Payer: Worker's Compensation

## 2016-06-26 ENCOUNTER — Encounter (HOSPITAL_COMMUNITY)
Admission: EM | Disposition: A | Discharge status: Skilled Nursing Facility | Payer: Self-pay | Source: Home / Self Care | Attending: Internal Medicine

## 2016-06-26 ENCOUNTER — Inpatient Hospital Stay (HOSPITAL_COMMUNITY)
Admission: EM | Admit: 2016-06-26 | Discharge: 2016-06-30 | DRG: 470 | Disposition: A | Discharge status: Skilled Nursing Facility | Payer: Worker's Compensation | Attending: Internal Medicine | Admitting: Internal Medicine

## 2016-06-26 ENCOUNTER — Inpatient Hospital Stay (HOSPITAL_COMMUNITY): Payer: Worker's Compensation | Admitting: Certified Registered"

## 2016-06-26 ENCOUNTER — Inpatient Hospital Stay (HOSPITAL_COMMUNITY): Payer: Worker's Compensation

## 2016-06-26 ENCOUNTER — Encounter (HOSPITAL_COMMUNITY): Payer: Self-pay

## 2016-06-26 DIAGNOSIS — E7800 Pure hypercholesterolemia, unspecified: Secondary | ICD-10-CM | POA: Diagnosis present

## 2016-06-26 DIAGNOSIS — J45909 Unspecified asthma, uncomplicated: Secondary | ICD-10-CM | POA: Diagnosis present

## 2016-06-26 DIAGNOSIS — Z886 Allergy status to analgesic agent status: Secondary | ICD-10-CM

## 2016-06-26 DIAGNOSIS — Z90710 Acquired absence of both cervix and uterus: Secondary | ICD-10-CM | POA: Diagnosis not present

## 2016-06-26 DIAGNOSIS — Z79899 Other long term (current) drug therapy: Secondary | ICD-10-CM

## 2016-06-26 DIAGNOSIS — I2510 Atherosclerotic heart disease of native coronary artery without angina pectoris: Secondary | ICD-10-CM | POA: Diagnosis present

## 2016-06-26 DIAGNOSIS — S72002A Fracture of unspecified part of neck of left femur, initial encounter for closed fracture: Secondary | ICD-10-CM | POA: Diagnosis present

## 2016-06-26 DIAGNOSIS — D4959 Neoplasm of unspecified behavior of other genitourinary organ: Secondary | ICD-10-CM | POA: Diagnosis present

## 2016-06-26 DIAGNOSIS — K219 Gastro-esophageal reflux disease without esophagitis: Secondary | ICD-10-CM | POA: Diagnosis present

## 2016-06-26 DIAGNOSIS — E785 Hyperlipidemia, unspecified: Secondary | ICD-10-CM | POA: Diagnosis present

## 2016-06-26 DIAGNOSIS — I519 Heart disease, unspecified: Secondary | ICD-10-CM | POA: Diagnosis present

## 2016-06-26 DIAGNOSIS — Z7982 Long term (current) use of aspirin: Secondary | ICD-10-CM

## 2016-06-26 DIAGNOSIS — Z91011 Allergy to milk products: Secondary | ICD-10-CM | POA: Diagnosis not present

## 2016-06-26 DIAGNOSIS — Z951 Presence of aortocoronary bypass graft: Secondary | ICD-10-CM

## 2016-06-26 DIAGNOSIS — F419 Anxiety disorder, unspecified: Secondary | ICD-10-CM | POA: Diagnosis present

## 2016-06-26 DIAGNOSIS — W010XXA Fall on same level from slipping, tripping and stumbling without subsequent striking against object, initial encounter: Secondary | ICD-10-CM | POA: Diagnosis present

## 2016-06-26 DIAGNOSIS — Z8249 Family history of ischemic heart disease and other diseases of the circulatory system: Secondary | ICD-10-CM | POA: Diagnosis not present

## 2016-06-26 DIAGNOSIS — Z96649 Presence of unspecified artificial hip joint: Secondary | ICD-10-CM

## 2016-06-26 DIAGNOSIS — Z01818 Encounter for other preprocedural examination: Secondary | ICD-10-CM

## 2016-06-26 DIAGNOSIS — W19XXXA Unspecified fall, initial encounter: Secondary | ICD-10-CM

## 2016-06-26 DIAGNOSIS — Z419 Encounter for procedure for purposes other than remedying health state, unspecified: Secondary | ICD-10-CM

## 2016-06-26 DIAGNOSIS — E784 Other hyperlipidemia: Secondary | ICD-10-CM | POA: Diagnosis not present

## 2016-06-26 DIAGNOSIS — I10 Essential (primary) hypertension: Secondary | ICD-10-CM | POA: Diagnosis present

## 2016-06-26 DIAGNOSIS — W19XXXD Unspecified fall, subsequent encounter: Secondary | ICD-10-CM | POA: Diagnosis not present

## 2016-06-26 DIAGNOSIS — M12552 Traumatic arthropathy, left hip: Secondary | ICD-10-CM | POA: Diagnosis not present

## 2016-06-26 HISTORY — PX: ANTERIOR APPROACH HEMI HIP ARTHROPLASTY: SHX6690

## 2016-06-26 LAB — COMPREHENSIVE METABOLIC PANEL
ALT: 21 U/L (ref 14–54)
AST: 29 U/L (ref 15–41)
Albumin: 4.4 g/dL (ref 3.5–5.0)
Alkaline Phosphatase: 83 U/L (ref 38–126)
Anion gap: 8 (ref 5–15)
BUN: 8 mg/dL (ref 6–20)
CO2: 26 mmol/L (ref 22–32)
Calcium: 9.4 mg/dL (ref 8.9–10.3)
Chloride: 105 mmol/L (ref 101–111)
Creatinine, Ser: 0.65 mg/dL (ref 0.44–1.00)
GFR calc Af Amer: >60 mL/min (ref >60)
GFR calc non Af Amer: >60 mL/min (ref >60)
Glucose, Bld: 118 mg/dL — ABNORMAL HIGH (ref 65–99)
Potassium: 4.2 mmol/L (ref 3.5–5.1)
Sodium: 139 mmol/L (ref 135–145)
Total Bilirubin: 0.3 mg/dL (ref 0.3–1.2)
Total Protein: 6.7 g/dL (ref 6.5–8.1)

## 2016-06-26 LAB — CBC WITH DIFFERENTIAL/PLATELET
Basophils Absolute: 0 10*3/uL (ref 0.0–0.1)
Basophils Relative: 0 %
Eosinophils Absolute: 0.2 10*3/uL (ref 0.0–0.7)
Eosinophils Relative: 5 %
HCT: 39.7 % (ref 36.0–46.0)
Hemoglobin: 13.2 g/dL (ref 12.0–15.0)
Lymphocytes Relative: 38 %
Lymphs Abs: 1.9 10*3/uL (ref 0.7–4.0)
MCH: 30.3 pg (ref 26.0–34.0)
MCHC: 33.2 g/dL (ref 30.0–36.0)
MCV: 91.3 fL (ref 78.0–100.0)
Monocytes Absolute: 0.3 10*3/uL (ref 0.1–1.0)
Monocytes Relative: 6 %
Neutro Abs: 2.5 10*3/uL (ref 1.7–7.7)
Neutrophils Relative %: 51 %
Platelets: 216 10*3/uL (ref 150–400)
RBC: 4.35 MIL/uL (ref 3.87–5.11)
RDW: 14.9 % (ref 11.5–15.5)
WBC: 4.8 10*3/uL (ref 4.0–10.5)

## 2016-06-26 LAB — SURGICAL PCR SCREEN
MRSA, PCR: NEGATIVE
Staphylococcus aureus: NEGATIVE

## 2016-06-26 SURGERY — HEMIARTHROPLASTY, HIP, DIRECT ANTERIOR APPROACH, FOR FRACTURE
Anesthesia: General | Site: Hip | Laterality: Left

## 2016-06-26 MED ORDER — TRANEXAMIC ACID 1000 MG/10ML IV SOLN
1000.0000 mg | Freq: Once | INTRAVENOUS | Status: DC
  Filled 2016-06-26: qty 10

## 2016-06-26 MED ORDER — METOCLOPRAMIDE HCL 5 MG/ML IJ SOLN: 5.0000 mg | Freq: Three times a day (TID) | INTRAMUSCULAR | Status: DC | PRN

## 2016-06-26 MED ORDER — ACETAMINOPHEN 325 MG PO TABS
650.0000 mg | ORAL_TABLET | Freq: Four times a day (QID) | ORAL | Status: DC | PRN
  Administered 2016-06-27: 650 mg via ORAL
  Filled 2016-06-26: qty 2

## 2016-06-26 MED ORDER — FLUTICASONE PROPIONATE 50 MCG/ACT NA SUSP
2.0000 | Freq: Every day | NASAL | Status: DC
  Administered 2016-06-29: 2 via NASAL
  Filled 2016-06-26: qty 16

## 2016-06-26 MED ORDER — LACTATED RINGERS IV SOLN
INTRAVENOUS | Status: DC
  Administered 2016-06-26 (×2): via INTRAVENOUS

## 2016-06-26 MED ORDER — SUGAMMADEX SODIUM 200 MG/2ML IV SOLN
INTRAVENOUS | Status: AC
  Filled 2016-06-26: qty 2

## 2016-06-26 MED ORDER — DIAZEPAM 5 MG/ML IJ SOLN
5.0000 mg | Freq: Once | INTRAMUSCULAR | Status: AC
  Administered 2016-06-26: 5 mg via INTRAVENOUS
  Filled 2016-06-26: qty 2

## 2016-06-26 MED ORDER — SODIUM CHLORIDE 0.9 % IV SOLN
INTRAVENOUS | Status: DC
  Administered 2016-06-26: 13:00:00 via INTRAVENOUS

## 2016-06-26 MED ORDER — METHOCARBAMOL 1000 MG/10ML IJ SOLN
500.0000 mg | Freq: Four times a day (QID) | INTRAVENOUS | Status: DC | PRN
  Filled 2016-06-26: qty 5

## 2016-06-26 MED ORDER — LIDOCAINE 2% (20 MG/ML) 5 ML SYRINGE
INTRAMUSCULAR | Status: AC
  Filled 2016-06-26: qty 5

## 2016-06-26 MED ORDER — METHOCARBAMOL 500 MG PO TABS
500.0000 mg | ORAL_TABLET | Freq: Four times a day (QID) | ORAL | Status: DC | PRN
  Administered 2016-06-28 – 2016-06-30 (×4): 500 mg via ORAL
  Filled 2016-06-26 (×4): qty 1

## 2016-06-26 MED ORDER — METOCLOPRAMIDE HCL 5 MG PO TABS: 5.0000 mg | ORAL_TABLET | Freq: Three times a day (TID) | ORAL | Status: DC | PRN

## 2016-06-26 MED ORDER — HYDROMORPHONE HCL 1 MG/ML IJ SOLN
0.2500 mg | INTRAMUSCULAR | Status: DC | PRN
  Administered 2016-06-26 (×3): 0.25 mg via INTRAVENOUS

## 2016-06-26 MED ORDER — PHENOL 1.4 % MT LIQD: 1.0000 | OROMUCOSAL | Status: DC | PRN

## 2016-06-26 MED ORDER — AMLODIPINE BESYLATE 5 MG PO TABS
5.0000 mg | ORAL_TABLET | Freq: Every day | ORAL | Status: DC
  Administered 2016-06-27 – 2016-06-30 (×4): 5 mg via ORAL
  Filled 2016-06-26 (×4): qty 1

## 2016-06-26 MED ORDER — ASPIRIN EC 81 MG PO TBEC
81.0000 mg | DELAYED_RELEASE_TABLET | Freq: Every day | ORAL | Status: DC
  Administered 2016-06-27 – 2016-06-30 (×4): 81 mg via ORAL
  Filled 2016-06-26 (×4): qty 1

## 2016-06-26 MED ORDER — ALBUMIN HUMAN 5 % IV SOLN
INTRAVENOUS | Status: DC | PRN
  Administered 2016-06-26: 18:00:00 via INTRAVENOUS

## 2016-06-26 MED ORDER — CEFAZOLIN SODIUM-DEXTROSE 2-4 GM/100ML-% IV SOLN
2.0000 g | INTRAVENOUS | Status: AC
  Administered 2016-06-26: 2 g via INTRAVENOUS
  Filled 2016-06-26: qty 100

## 2016-06-26 MED ORDER — ROCURONIUM BROMIDE 100 MG/10ML IV SOLN
INTRAVENOUS | Status: DC | PRN
  Administered 2016-06-26: 50 mg via INTRAVENOUS

## 2016-06-26 MED ORDER — METHOCARBAMOL 500 MG PO TABS: 500.0000 mg | ORAL_TABLET | Freq: Four times a day (QID) | ORAL | Status: DC | PRN

## 2016-06-26 MED ORDER — LORATADINE 10 MG PO TABS: 10.0000 mg | ORAL_TABLET | Freq: Every day | ORAL | Status: DC | PRN

## 2016-06-26 MED ORDER — TRANEXAMIC ACID 1000 MG/10ML IV SOLN: 1000.0000 mg | INTRAVENOUS | Status: DC

## 2016-06-26 MED ORDER — ACETAMINOPHEN 650 MG RE SUPP: 650.0000 mg | Freq: Four times a day (QID) | RECTAL | Status: DC | PRN

## 2016-06-26 MED ORDER — DEXAMETHASONE SODIUM PHOSPHATE 10 MG/ML IJ SOLN
INTRAMUSCULAR | Status: AC
  Filled 2016-06-26: qty 1

## 2016-06-26 MED ORDER — MORPHINE SULFATE (PF) 4 MG/ML IV SOLN
1.0000 mg | INTRAVENOUS | Status: DC | PRN
  Administered 2016-06-26: 2 mg via INTRAVENOUS
  Filled 2016-06-26: qty 1

## 2016-06-26 MED ORDER — LIDOCAINE HCL (CARDIAC) 20 MG/ML IV SOLN
INTRAVENOUS | Status: DC | PRN
  Administered 2016-06-26: 100 mg via INTRAVENOUS

## 2016-06-26 MED ORDER — MOMETASONE FURO-FORMOTEROL FUM 100-5 MCG/ACT IN AERO
2.0000 | INHALATION_SPRAY | Freq: Two times a day (BID) | RESPIRATORY_TRACT | Status: DC
  Administered 2016-06-27 – 2016-06-30 (×6): 2 via RESPIRATORY_TRACT
  Filled 2016-06-26: qty 8.8

## 2016-06-26 MED ORDER — MEPERIDINE HCL 25 MG/ML IJ SOLN: 6.2500 mg | INTRAMUSCULAR | Status: DC | PRN

## 2016-06-26 MED ORDER — ONDANSETRON HCL 4 MG/2ML IJ SOLN
4.0000 mg | Freq: Once | INTRAMUSCULAR | Status: AC
  Administered 2016-06-26: 4 mg via INTRAVENOUS
  Filled 2016-06-26: qty 2

## 2016-06-26 MED ORDER — ALUM & MAG HYDROXIDE-SIMETH 200-200-20 MG/5ML PO SUSP: 30.0000 mL | ORAL | Status: DC | PRN

## 2016-06-26 MED ORDER — VITAMIN D3 25 MCG (1000 UNIT) PO TABS
2000.0000 [IU] | ORAL_TABLET | ORAL | Status: DC
  Administered 2016-06-27 – 2016-06-30 (×4): 2000 [IU] via ORAL
  Filled 2016-06-26 (×8): qty 2

## 2016-06-26 MED ORDER — TRANEXAMIC ACID 1000 MG/10ML IV SOLN
1000.0000 mg | INTRAVENOUS | Status: AC
  Administered 2016-06-26: 1000 mg via INTRAVENOUS
  Filled 2016-06-26: qty 10

## 2016-06-26 MED ORDER — ONDANSETRON HCL 4 MG/2ML IJ SOLN: 4.0000 mg | Freq: Four times a day (QID) | INTRAMUSCULAR | Status: DC | PRN

## 2016-06-26 MED ORDER — FENTANYL CITRATE (PF) 250 MCG/5ML IJ SOLN
INTRAMUSCULAR | Status: AC
  Filled 2016-06-26: qty 5

## 2016-06-26 MED ORDER — PHENYLEPHRINE HCL 10 MG/ML IJ SOLN
INTRAMUSCULAR | Status: DC | PRN
  Administered 2016-06-26: 120 ug via INTRAVENOUS
  Administered 2016-06-26 (×4): 80 ug via INTRAVENOUS

## 2016-06-26 MED ORDER — CEFAZOLIN SODIUM-DEXTROSE 2-4 GM/100ML-% IV SOLN
2.0000 g | Freq: Four times a day (QID) | INTRAVENOUS | Status: AC
  Administered 2016-06-26 – 2016-06-27 (×3): 2 g via INTRAVENOUS
  Filled 2016-06-26 (×3): qty 100

## 2016-06-26 MED ORDER — ONDANSETRON HCL 4 MG/2ML IJ SOLN: 4.0000 mg | Freq: Once | INTRAMUSCULAR | Status: DC | PRN

## 2016-06-26 MED ORDER — ONDANSETRON HCL 4 MG/2ML IJ SOLN
INTRAMUSCULAR | Status: DC | PRN
  Administered 2016-06-26: 4 mg via INTRAVENOUS

## 2016-06-26 MED ORDER — 0.9 % SODIUM CHLORIDE (POUR BTL) OPTIME
TOPICAL | Status: DC | PRN
  Administered 2016-06-26: 1000 mL

## 2016-06-26 MED ORDER — ALBUTEROL SULFATE (2.5 MG/3ML) 0.083% IN NEBU: 2.5000 mg | INHALATION_SOLUTION | Freq: Four times a day (QID) | RESPIRATORY_TRACT | Status: DC | PRN

## 2016-06-26 MED ORDER — CEFAZOLIN SODIUM-DEXTROSE 2-4 GM/100ML-% IV SOLN
INTRAVENOUS | Status: AC
  Filled 2016-06-26: qty 100

## 2016-06-26 MED ORDER — HYDROCODONE-ACETAMINOPHEN 5-325 MG PO TABS
1.0000 | ORAL_TABLET | Freq: Four times a day (QID) | ORAL | Status: DC | PRN
  Administered 2016-06-27 – 2016-06-28 (×5): 1 via ORAL
  Administered 2016-06-28: 2 via ORAL
  Administered 2016-06-29 – 2016-06-30 (×3): 1 via ORAL
  Administered 2016-06-30: 2 via ORAL
  Filled 2016-06-26: qty 2
  Filled 2016-06-26 (×4): qty 1
  Filled 2016-06-26: qty 2
  Filled 2016-06-26 (×5): qty 1

## 2016-06-26 MED ORDER — HYDROMORPHONE HCL 1 MG/ML IJ SOLN
INTRAMUSCULAR | Status: AC
  Filled 2016-06-26: qty 0.5

## 2016-06-26 MED ORDER — DEXAMETHASONE SODIUM PHOSPHATE 10 MG/ML IJ SOLN
INTRAMUSCULAR | Status: DC | PRN
  Administered 2016-06-26: 10 mg via INTRAVENOUS

## 2016-06-26 MED ORDER — OXYCODONE HCL 5 MG PO TABS: 5.0000 mg | ORAL_TABLET | ORAL | Status: DC | PRN

## 2016-06-26 MED ORDER — ENOXAPARIN SODIUM 40 MG/0.4ML ~~LOC~~ SOLN
40.0000 mg | SUBCUTANEOUS | Status: DC
  Administered 2016-06-27 – 2016-06-29 (×3): 40 mg via SUBCUTANEOUS
  Filled 2016-06-26 (×3): qty 0.4

## 2016-06-26 MED ORDER — ROSUVASTATIN CALCIUM 10 MG PO TABS
10.0000 mg | ORAL_TABLET | Freq: Every day | ORAL | Status: DC
  Administered 2016-06-27 – 2016-06-30 (×4): 10 mg via ORAL
  Filled 2016-06-26 (×4): qty 1

## 2016-06-26 MED ORDER — MAGNESIUM OXIDE 400 (241.3 MG) MG PO TABS
400.0000 mg | ORAL_TABLET | Freq: Every day | ORAL | Status: DC
  Administered 2016-06-28 – 2016-06-29 (×2): 400 mg via ORAL
  Filled 2016-06-26 (×2): qty 1

## 2016-06-26 MED ORDER — CHLORHEXIDINE GLUCONATE 4 % EX LIQD: 60.0000 mL | Freq: Once | CUTANEOUS | Status: DC

## 2016-06-26 MED ORDER — PANTOPRAZOLE SODIUM 40 MG PO TBEC
40.0000 mg | DELAYED_RELEASE_TABLET | Freq: Every day | ORAL | Status: DC
  Administered 2016-06-27 – 2016-06-30 (×4): 40 mg via ORAL
  Filled 2016-06-26 (×4): qty 1

## 2016-06-26 MED ORDER — POVIDONE-IODINE 10 % EX SWAB: 2.0000 "application " | Freq: Once | CUTANEOUS | Status: DC

## 2016-06-26 MED ORDER — TRANEXAMIC ACID 1000 MG/10ML IV SOLN
2000.0000 mg | INTRAVENOUS | Status: AC
  Filled 2016-06-26: qty 20

## 2016-06-26 MED ORDER — MENTHOL 3 MG MT LOZG: 1.0000 | LOZENGE | OROMUCOSAL | Status: DC | PRN

## 2016-06-26 MED ORDER — HYDROMORPHONE HCL 1 MG/ML IJ SOLN
INTRAMUSCULAR | Status: AC
  Administered 2016-06-26: 0.25 mg via INTRAVENOUS
  Filled 2016-06-26: qty 0.5

## 2016-06-26 MED ORDER — ONDANSETRON HCL 4 MG PO TABS
4.0000 mg | ORAL_TABLET | Freq: Four times a day (QID) | ORAL | Status: DC | PRN
  Administered 2016-06-28: 4 mg via ORAL
  Filled 2016-06-26: qty 1

## 2016-06-26 MED ORDER — METOPROLOL TARTRATE 25 MG PO TABS
25.0000 mg | ORAL_TABLET | Freq: Two times a day (BID) | ORAL | Status: DC
  Administered 2016-06-26 – 2016-06-30 (×6): 25 mg via ORAL
  Filled 2016-06-26 (×7): qty 1

## 2016-06-26 MED ORDER — CEFAZOLIN SODIUM-DEXTROSE 2-4 GM/100ML-% IV SOLN: 2.0000 g | INTRAVENOUS | Status: DC

## 2016-06-26 MED ORDER — PROPOFOL 10 MG/ML IV BOLUS
INTRAVENOUS | Status: DC | PRN
  Administered 2016-06-26: 100 mg via INTRAVENOUS

## 2016-06-26 MED ORDER — OXYCODONE HCL 5 MG PO TABS: 5.0000 mg | ORAL_TABLET | ORAL | 0 refills | Status: DC | PRN

## 2016-06-26 MED ORDER — PROPOFOL 10 MG/ML IV BOLUS
INTRAVENOUS | Status: AC
  Filled 2016-06-26: qty 20

## 2016-06-26 MED ORDER — SUGAMMADEX SODIUM 200 MG/2ML IV SOLN
INTRAVENOUS | Status: DC | PRN
  Administered 2016-06-26: 200 mg via INTRAVENOUS

## 2016-06-26 MED ORDER — MORPHINE SULFATE (PF) 4 MG/ML IV SOLN: 0.5000 mg | INTRAVENOUS | Status: DC | PRN

## 2016-06-26 MED ORDER — LATANOPROST 0.005 % OP SOLN
1.0000 [drp] | Freq: Every day | OPHTHALMIC | Status: DC
  Administered 2016-06-26 – 2016-06-29 (×4): 1 [drp] via OPHTHALMIC
  Filled 2016-06-26: qty 2.5

## 2016-06-26 MED ORDER — ENOXAPARIN SODIUM 40 MG/0.4ML ~~LOC~~ SOLN: 40.0000 mg | Freq: Every day | SUBCUTANEOUS | 0 refills | Status: DC

## 2016-06-26 MED ORDER — DORZOLAMIDE HCL-TIMOLOL MAL 2-0.5 % OP SOLN
1.0000 [drp] | Freq: Two times a day (BID) | OPHTHALMIC | Status: DC
  Administered 2016-06-28 – 2016-06-30 (×5): 1 [drp] via OPHTHALMIC
  Filled 2016-06-26: qty 10

## 2016-06-26 MED ORDER — TRANEXAMIC ACID 1000 MG/10ML IV SOLN
INTRAVENOUS | Status: AC | PRN
  Administered 2016-06-26: 2000 mg via TOPICAL

## 2016-06-26 MED ORDER — SODIUM CHLORIDE 0.9 % IV SOLN: INTRAVENOUS | Status: DC

## 2016-06-26 MED ORDER — FENTANYL CITRATE (PF) 100 MCG/2ML IJ SOLN
50.0000 ug | Freq: Once | INTRAMUSCULAR | Status: AC
  Administered 2016-06-26: 50 ug via INTRAVENOUS
  Filled 2016-06-26: qty 2

## 2016-06-26 MED ORDER — FENTANYL CITRATE (PF) 100 MCG/2ML IJ SOLN
INTRAMUSCULAR | Status: DC | PRN
  Administered 2016-06-26: 50 ug via INTRAVENOUS
  Administered 2016-06-26: 100 ug via INTRAVENOUS
  Administered 2016-06-26: 50 ug via INTRAVENOUS

## 2016-06-26 MED ORDER — ROCURONIUM BROMIDE 10 MG/ML (PF) SYRINGE
PREFILLED_SYRINGE | INTRAVENOUS | Status: AC
  Filled 2016-06-26: qty 5

## 2016-06-26 MED ORDER — GLYCOPYRROLATE 0.2 MG/ML IJ SOLN
INTRAMUSCULAR | Status: DC | PRN
  Administered 2016-06-26: 0.2 mg via INTRAVENOUS

## 2016-06-26 MED ORDER — SODIUM CHLORIDE 0.9 % IR SOLN
Status: DC | PRN
  Administered 2016-06-26: 3000 mL

## 2016-06-26 MED ORDER — ONDANSETRON HCL 4 MG/2ML IJ SOLN
INTRAMUSCULAR | Status: AC
  Filled 2016-06-26: qty 2

## 2016-06-26 SURGICAL SUPPLY — 53 items
BAG DECANTER FOR FLEXI CONT (MISCELLANEOUS) ×3 IMPLANT
BLADE SURG 10 STRL SS (BLADE) ×3 IMPLANT
CAPT HIP TOTAL 2 ×3 IMPLANT
CELLS DAT CNTRL 66122 CELL SVR (MISCELLANEOUS) ×1 IMPLANT
COVER SURGICAL LIGHT HANDLE (MISCELLANEOUS) ×3 IMPLANT
CUP SECTOR GRIPTON 50MM (Cup) ×3 IMPLANT
DRAPE C-ARM 42X72 X-RAY (DRAPES) ×3 IMPLANT
DRAPE STERI IOBAN 125X83 (DRAPES) ×3 IMPLANT
DRAPE U-SHAPE 47X51 STRL (DRAPES) ×6 IMPLANT
DRSG AQUACEL AG ADV 3.5X10 (GAUZE/BANDAGES/DRESSINGS) ×3 IMPLANT
DRSG MEPILEX BORDER 4X12 (GAUZE/BANDAGES/DRESSINGS) ×3 IMPLANT
DURAPREP 26ML APPLICATOR (WOUND CARE) ×3 IMPLANT
ELECT BLADE 4.0 EZ CLEAN MEGAD (MISCELLANEOUS) ×3
ELECT REM PT RETURN 9FT ADLT (ELECTROSURGICAL) ×3
ELECTRODE BLDE 4.0 EZ CLN MEGD (MISCELLANEOUS) ×1 IMPLANT
ELECTRODE REM PT RTRN 9FT ADLT (ELECTROSURGICAL) ×1 IMPLANT
GLOVE BIOGEL PI IND STRL 7.0 (GLOVE) ×2 IMPLANT
GLOVE BIOGEL PI INDICATOR 7.0 (GLOVE) ×4
GLOVE ECLIPSE 7.0 STRL STRAW (GLOVE) ×6 IMPLANT
GLOVE SKINSENSE NS SZ7.5 (GLOVE) ×2
GLOVE SKINSENSE STRL SZ7.5 (GLOVE) ×1 IMPLANT
GLOVE SURG SYN 7.5  E (GLOVE) ×4
GLOVE SURG SYN 7.5 E (GLOVE) ×2 IMPLANT
GOWN SRG XL XLNG 56XLVL 4 (GOWN DISPOSABLE) ×1 IMPLANT
GOWN STRL NON-REIN XL XLG LVL4 (GOWN DISPOSABLE) ×2
GOWN STRL REUS W/ TWL LRG LVL3 (GOWN DISPOSABLE) ×2 IMPLANT
GOWN STRL REUS W/TWL LRG LVL3 (GOWN DISPOSABLE) ×4
HANDPIECE INTERPULSE COAX TIP (DISPOSABLE) ×2
HEAD FEMORAL 32 CERAMIC (Hips) ×3 IMPLANT
HOOD PEEL AWAY FLYTE STAYCOOL (MISCELLANEOUS) ×3 IMPLANT
IV NS IRRIG 3000ML ARTHROMATIC (IV SOLUTION) ×3 IMPLANT
KIT BASIN OR (CUSTOM PROCEDURE TRAY) ×3 IMPLANT
LINER ACET PNNCL PLUS4 NEUTRAL (Hips) ×1 IMPLANT
MARKER SKIN DUAL TIP RULER LAB (MISCELLANEOUS) ×3 IMPLANT
PACK TOTAL JOINT (CUSTOM PROCEDURE TRAY) ×3 IMPLANT
PACK UNIVERSAL I (CUSTOM PROCEDURE TRAY) ×6 IMPLANT
PINNACLE PLUS 4 NEUTRAL (Hips) ×3 IMPLANT
RTRCTR WOUND ALEXIS 18CM MED (MISCELLANEOUS) ×3
SAW OSC TIP CART 19.5X105X1.3 (SAW) ×3 IMPLANT
SCREW 6.5MMX25MM (Screw) ×3 IMPLANT
SET HNDPC FAN SPRY TIP SCT (DISPOSABLE) ×1 IMPLANT
STAPLER VISISTAT 35W (STAPLE) ×3 IMPLANT
STEM CORAIL KA11 (Stem) ×3 IMPLANT
SUT ETHIBOND 2 V 37 (SUTURE) ×3 IMPLANT
SUT VIC AB 0 CT1 27 (SUTURE) ×2
SUT VIC AB 0 CT1 27XBRD ANBCTR (SUTURE) ×1 IMPLANT
SUT VIC AB 1 CT1 27 (SUTURE) ×2
SUT VIC AB 1 CT1 27XBRD ANBCTR (SUTURE) ×1 IMPLANT
SUT VIC AB 2-0 CT1 27 (SUTURE) ×2
SUT VIC AB 2-0 CT1 TAPERPNT 27 (SUTURE) ×1 IMPLANT
TOWEL OR 17X26 10 PK STRL BLUE (TOWEL DISPOSABLE) ×3 IMPLANT
TRAY CATH 16FR W/PLASTIC CATH (SET/KITS/TRAYS/PACK) ×3 IMPLANT
YANKAUER SUCT BULB TIP NO VENT (SUCTIONS) ×3 IMPLANT

## 2016-06-26 NOTE — Op Note (Signed)
ANTERIOR APPROACH TOTAL HIP ARTHROPLASTY  Procedure Note Theresa Crosby   [id number]  Pre-op Diagnosis: Left Hip fracture     Post-op Diagnosis: same   Operative Procedures  1. Total hip replacement; Left hip; uncemented [id number]   Personnel  Surgeon(s): Leandrew Koyanagi, MD   Anesthesia: general  Prosthesis: Depuy Acetabulum: Pinnacle 50 mm Femur: Corail KA 11 Head: 32 size: +1 Liner: +4 Bearing Type: ceramic on poly  Total Hip Arthroplasty (Anterior Approach) Op Note:  After informed consent was obtained and the operative extremity marked in the holding area, the patient was brought back to the operating room and placed supine on the HANA table. Next, the operative extremity was prepped and draped in normal sterile fashion. Surgical timeout occurred verifying patient identification, surgical site, surgical procedure and administration of antibiotics.  A modified anterior Smith-Peterson approach to the hip was performed, using the interval between tensor fascia lata and sartorius.  Dissection was carried bluntly down onto the anterior hip capsule. The lateral femoral circumflex vessels were identified and coagulated. A capsulotomy was performed and the capsular flaps tagged for later repair.  Fluoroscopy was utilized to prepare for the femoral neck cut. The neck osteotomy was performed. The femoral head was removed, the acetabular rim was cleared of soft tissue and attention was turned to reaming the acetabulum.  Sequential reaming was performed under fluoroscopic guidance. We reamed to a size 49 mm, and then impacted the acetabular shell. The liner was then placed after irrigation and attention turned to the femur.  After placing the femoral hook, the leg was taken to externally rotated, extended and adducted position taking care to perform soft tissue releases to allow for adequate mobilization of the femur. Soft tissue was cleared from the shoulder of the greater trochanter and the  hook elevator used to improve exposure of the proximal femur. Sequential broaching performed up to a size 11. Trial neck and head were placed. The leg was brought back up to neutral and the construct reduced. The position and sizing of components, offset and leg lengths were checked using fluoroscopy. Stability of the  construct was checked in extension and external rotation without any subluxation or impingement of prosthesis. We dislocated the prosthesis, dropped the leg back into position, removed trial components, and irrigated copiously. The final stem and head was then placed, the leg brought back up, the system reduced and fluoroscopy used to verify positioning.  We irrigated, obtained hemostasis and closed the capsule using #2 ethibond suture.  Dilute betadyne solution was used. The fascia was closed with #1 vicryl plus, the deep fat layer was closed with 0 vicryl, the subcutaneous layers closed with 2.0 Vicryl Plus and the skin closed with staples and steri strips. A sterile dressing was applied. The patient was awakened in the operating room and taken to recovery in stable condition.  All sponge, needle, and instrument counts were correct at the end of the case.   Position: supine  Complications: none.  Time Out: performed   Drains/Packing: none  Estimated blood loss: 100 cc  Returned to Recovery Room: in good condition.   Antibiotics: yes   Mechanical VTE (DVT) Prophylaxis: sequential compression devices, TED thigh-high  Chemical VTE (DVT) Prophylaxis: lovenox   Fluid Replacement: see anesthesia record  Specimens Removed: 1 to pathology   Sponge and Instrument Count Correct? yes   PACU: portable radiograph - low AP   Admission: inpatient status  Plan/RTC: Return in 2 weeks for staple removal. Weight Bearing/Load  Lower Extremity: full  Hip precautions: none Suture Removal: 10-14 days  Betadine to incision twice daily once dressing is removed on POD#7  N. Eduard Roux,  MD [place] [phone] 6:03 PM      Implant Name Type Inv. Item Serial No. Manufacturer Lot No. LRB No. Used  CUP SECTOR GRIPTON 50MM - [id number] Cup CUP SECTOR GRIPTON 50MM  DEPUY SYNTHES [id number] Left 1  PINNACLE PLUS 4 NEUTRAL - [id number] Hips PINNACLE PLUS 4 NEUTRAL  DEPUY SYNTHES [id number] Left 1  SCREW 6.5MMX25MM - [id number] Screw SCREW 6.5MMX25MM  DEPUY SYNTHES [id number] Left 1  STEM CORAIL KA11 - [id number] Stem STEM CORAIL KA11  DEPUY SYNTHES [id number] Left 1  SROM M HEAD 32MM PLUS 1 - [id number] Hips SROM M HEAD 32MM PLUS 1   DEPUY SYNTHES [id number] Left 1

## 2016-06-26 NOTE — ED Provider Notes (Signed)
Post DEPT Provider Note   CSN: [id number] Arrival date & time: 06/26/16  1125     History   Chief Complaint Chief Complaint  Patient presents with  . Fall    HPI Theresa Crosby is a 83 y.o. female.  HPI  83 year old female with a history of coronary artery disease, hypertension, hyperlipidemia presents with concern for fall with left hip pain.  Reports she tripped over a chair, and fell onto her left side. Had immediate pain to her left groin. Pain had decreased with fentanyl with EMS to a 1 out of 10, however her some movement in the emergency department is currently a 4 out of 10. Denies head trauma, loss of consciousness, blood thinner use, neck pain, back pain or other injuries from the fall. No other current symptoms.  Past Medical History:  Diagnosis Date  . Abnormal nuclear stress test 10/05/2011  . Anxiety    2005- related to care of aging parent   . Arthritis    degeneration - spondylosis, OA - all over body,   . Blood transfusion    postop- back surg. 1987  . Bunion   . CAD (coronary artery disease)significant diseas with 99% LAD, 80% 1st diag, 80% LCX 10/05/2011  . Chest pain at rest associated with dyspnea 10/05/2011  . GERD (gastroesophageal reflux disease)   . Hyperlipemia   . Hypertension    ireg. heartbeat, no referral made to cardiologist , sees [facility]. Med.   . Neuromuscular disorder (HCC)    burning- L leg , numbing of toes   . S/P CABG x 4, LIMA-LAD, SVG-OM2, SVG-diag, SVG-RCA secondary to symptomatic CAD and positive stress test 10/05/2011  . Shortness of breath     Patient Active Problem List   Diagnosis Date Noted  . Left displaced femoral neck fracture ([place]) 06/26/2016  . Hypertension, essential 06/26/2016  . HLD (hyperlipidemia) 06/26/2016  . Coronary heart disease 06/26/2016  . Left ventricular diastolic dysfunction 83/15/1761  . Asthma 06/26/2016  . Left shoulder pain 12/10/2014  . Pain in toe of left foot 02/13/2014  . Pain in  lower limb 12/18/2013  . Ingrown nail 11/30/2012  . Pain in joint, ankle and foot 08/30/2012  . Onychomycosis due to dermatophyte 05/30/2012  . Chest pain 11/02/2011  . Oral thrush 11/02/2011  . Neuropathy, lower extremities 10/07/2011  . HTN (hypertension) 10/07/2011  . Dyslipidemia 10/07/2011  . Chest pain at rest associated with dyspnea 10/05/2011  . Abnormal nuclear stress test 10/05/2011  . CAD s/p CABG x 4 2013 10/05/2011  . S/P CABG x 4, LIMA-LAD, SVG-OM2, SVG-diag, SVG-RCA  10/04/11 10/05/2011    Past Surgical History:  Procedure Laterality Date  . ABDOMINAL HYSTERECTOMY     partial hysterectomy- 1972  . BACK SURGERY     1987-   . BREAST CYST ASPIRATION  06/18/2010  . CARDIAC CATHETERIZATION  10/12/2011   high-grade ca+ prox. LAD diagonal branch,higly diseased diagonal branch & obtuse marginal branch  . CORONARY ARTERY BYPASS GRAFT  10/04/2011   Procedure: CORONARY ARTERY BYPASS GRAFTING (CABG);  Surgeon: Gaye Pollack, MD;  Location: [place];  Service: Open Heart Surgery;  Laterality: N/A;  CABG x four using left internal mammary artery and right leg greater saphenous vein   . EYE SURGERY     cataracts removed- bilateral, no IOL  . FOOT SURGERY Right January 2013   HT 5  . LEFT HEART CATHETERIZATION WITH CORONARY ANGIOGRAM N/A 10/02/2011   Procedure: LEFT HEART CATHETERIZATION WITH CORONARY  ANGIOGRAM;  Surgeon: Lorretta Harp, MD;  [facility] CATH LAB;  Service: Cardiovascular;  Laterality: N/A;  . LUMBAR LAMINECTOMY/DECOMPRESSION MICRODISCECTOMY  03/04/2011   Procedure: LUMBAR LAMINECTOMY/DECOMPRESSION MICRODISCECTOMY;  Surgeon: Hosie Spangle, MD;  Location: [place] NEURO ORS;  Service: Neurosurgery;  Laterality: N/A;  Thoracic Ten-Thoracic Twelve Thoracic Laminectomy  . NM MYOVIEW LTD  09/15/2011   mild ischemia mid anterior & apical anterior region.EF: 86%  . TOE SURGERY     R foot- removed calus between 5-4  . TUBAL LIGATION      OB History    No data available         Home Medications    Prior to Admission medications   Medication Sig Start Date End Date Taking? Authorizing Provider  ADVAIR DISKUS 100-50 MCG/DOSE AEPB Inhale 1 puff into the lungs every 12 (twelve) hours as needed for wheezing. 05/22/16  Yes [provider]  albuterol (PROVENTIL HFA;VENTOLIN HFA) 108 (90 BASE) MCG/ACT inhaler Inhale 1-2 puffs into the lungs every 6 (six) hours as needed for wheezing or shortness of breath.   Yes [provider]  amLODipine (NORVASC) 5 MG tablet Take 5 mg by mouth daily.     Yes [provider]  aspirin EC 81 MG tablet Take 1 tablet (81 mg total) by mouth daily. 11/02/11  Yes Oti, Brantley Stage, MD  Cholecalciferol (VITAMIN D3) 1000 UNITS CAPS Take 2,000 Units by mouth every morning.     Yes [provider]  diclofenac sodium (VOLTAREN) 1 % GEL Apply 1 g topically daily as needed for pain. 06/05/16  Yes [provider]  dorzolamide-timolol (COSOPT) 22.3-6.8 MG/ML ophthalmic solution Place 1 drop into both eyes 2 (two) times daily. 06/10/16  Yes [provider]  fluticasone (FLONASE) 50 MCG/ACT nasal spray Place 2 sprays into the nose as needed for rhinitis.   Yes [provider]  latanoprost (XALATAN) 0.005 % ophthalmic solution Place 1 drop into both eyes at bedtime.     Yes [provider]  loratadine (CLARITIN) 10 MG tablet Take 10 mg by mouth daily as needed for allergies.   Yes [provider]  Magnesium Oxide 500 MG TABS Take 500 mg by mouth daily.   Yes [provider]  metoprolol tartrate (LOPRESSOR) 25 MG tablet Take 25 mg by mouth 2 (two) times daily.    Yes [provider]  nitroGLYCERIN (NITROSTAT) 0.4 MG SL tablet Place 0.4 mg under the tongue every 5 (five) minutes as needed for chest pain.   Yes [provider]  pantoprazole (PROTONIX) 40 MG tablet Take 40 mg by mouth daily.   Yes [provider]  rosuvastatin (CRESTOR) 10 MG tablet Take  1 tablet (10 mg total) by mouth daily. 01/28/15  Yes Almyra Deforest, PA  cetirizine (ZYRTEC ALLERGY) 10 MG tablet Take 1 tablet (10 mg total) by mouth daily. Patient not taking: Reported on 06/26/2016 01/28/15   Almyra Deforest, PA  enoxaparin (LOVENOX) 40 MG/0.4ML injection Inject 0.4 mLs (40 mg total) into the skin daily. 06/26/16   Leandrew Koyanagi, MD  oxybutynin (DITROPAN XL) 10 MG 24 hr tablet Take 1 tablet (10 mg total) by mouth at bedtime. Patient not taking: Reported on 06/26/2016 02/18/16   Mackuen, Courteney Lyn, MD  oxyCODONE (OXY IR/ROXICODONE) 5 MG immediate release tablet Take 1-3 tablets (5-15 mg total) by mouth every 4 (four) hours as needed. 06/26/16   Leandrew Koyanagi, MD  polyethylene glycol [facility]) packet Take 17 g by mouth  daily. Patient not taking: Reported on 06/26/2016 02/18/16   Macarthur Critchley, MD    Family History Family History  Problem Relation Age of Onset  . Heart attack Mother   . Heart attack Father   . Hypertension Father   . Heart attack Brother   . Anesthesia problems Neg Hx   . Hypotension Neg Hx   . Malignant hyperthermia Neg Hx   . Pseudochol deficiency Neg Hx     Social History Social History  Substance Use Topics  . Smoking status: Never Smoker  . Smokeless tobacco: Never Used  . Alcohol use No     Allergies   Aspirin and Lactose intolerance (gi)   Review of Systems Review of Systems  Constitutional: Negative for fever.  HENT: Negative for sore throat.   Eyes: Negative for visual disturbance.  Respiratory: Negative for cough and shortness of breath.   Cardiovascular: Negative for chest pain.  Gastrointestinal: Negative for abdominal pain. Nausea: mild after receiving pain medication.  Genitourinary: Negative for difficulty urinating.  Musculoskeletal: Positive for arthralgias. Negative for back pain and neck pain.  Skin: Negative for rash.  Neurological: Negative for syncope and headaches.     Physical Exam Updated Vital Signs BP 131/84    Pulse 89   Temp 97.2 F (36.2 C)   Resp 20   Ht 4\' 11"  (1.499 m)   Wt 102 lb 6.4 oz (46.4 kg)   SpO2 99%   BMI 20.68 kg/m   Physical Exam  Constitutional: She is oriented to person, place, and time. She appears well-developed and well-nourished. No distress.  HENT:  Head: Normocephalic and atraumatic.  Eyes: Conjunctivae and EOM are normal.  Neck: Normal range of motion.  Cardiovascular: Normal rate, regular rhythm, normal heart sounds and intact distal pulses.  Exam reveals no gallop and no friction rub.   No murmur heard. Pulmonary/Chest: Effort normal and breath sounds normal. No respiratory distress. She has no wheezes. She has no rales.  Abdominal: Soft. She exhibits no distension. There is no tenderness. There is no guarding.  Musculoskeletal: She exhibits no edema.       Left hip: She exhibits decreased range of motion, tenderness and bony tenderness.       Right ankle: She exhibits normal pulse.       Left ankle: She exhibits normal pulse.  Left leg shortened externally rotated   Neurological: She is alert and oriented to person, place, and time.  Skin: Skin is warm and dry. No rash noted. She is not diaphoretic. No erythema.  Nursing note and vitals reviewed.    ED Treatments / Results  Labs (all labs ordered are listed, but only abnormal results are displayed) Labs Reviewed  COMPREHENSIVE METABOLIC PANEL - Abnormal; Notable for the following:       Result Value   Glucose, Bld 118 (*)    All other components within normal limits  SURGICAL PCR SCREEN  CBC WITH DIFFERENTIAL/PLATELET  VITAMIN D 25 HYDROXY (VIT D DEFICIENCY, FRACTURES)  CBC  BASIC METABOLIC PANEL  TYPE AND SCREEN    EKG  EKG Interpretation None       Radiology Dg Chest Port 1 View  Result Date: 06/26/2016 CLINICAL DATA:  Preoperative examination. EXAM: PORTABLE CHEST 1 VIEW COMPARISON:  02/24/2016 FINDINGS: Prior median sternotomy. Both lungs are clear. Heart size is normal. The  trachea is midline. Negative for a pneumothorax. Bony thorax is intact. IMPRESSION: No active disease. Electronically Signed   By: Scherrie Gerlach.D.  On: 06/26/2016 14:00   Dg C-arm 61-120 Min-no Report  Result Date: 06/26/2016 Fluoroscopy was utilized by the requesting physician.  No radiographic interpretation.   Dg Hip Operative Unilat W Or W/o Pelvis Left  Result Date: 06/26/2016 CLINICAL DATA:  Total left hip arthroplasty EXAM: OPERATIVE LEFT HIP (WITH PELVIS IF PERFORMED) 1 VIEWS TECHNIQUE: Fluoroscopic spot image(s) were submitted for interpretation post-operatively. COMPARISON:  Radiography from earlier today FINDINGS: Fluoroscopy shows left femoral neck fracture treated with total hip arthroplasty. No unexpected finding. IMPRESSION: Fluoroscopy for total hip arthroplasty. Electronically Signed   By: Monte Fantasia M.D.   On: 06/26/2016 18:04   Dg Hip Unilat With Pelvis 2-3 Views Left  Result Date: 06/26/2016 CLINICAL DATA:  Recent fall with left hip pain, initial encounter EXAM: DG HIP (WITH OR WITHOUT PELVIS) 2-3V LEFT COMPARISON:  None. FINDINGS: There is a left femoral neck fracture with impaction and angulation at the fracture site. The femoral head remains well seated. The pelvic ring is intact. IMPRESSION: Left femoral neck fracture with impaction Electronically Signed   By: Inez Catalina M.D.   On: 06/26/2016 12:12    Procedures Procedures (including critical care time)  Medications Ordered in ED Medications  0.9 %  sodium chloride infusion ( Intravenous New Bag/Given 06/26/16 1328)  morphine 4 MG/ML injection 1-4 mg ( Intravenous MAR Hold 06/26/16 1539)  ondansetron (ZOFRAN) injection 4 mg ( Intravenous MAR Hold 06/26/16 1539)  mometasone-formoterol (DULERA) 100-5 MCG/ACT inhaler 2 puff ( Inhalation Automatically Held 07/04/16 2000)  albuterol (PROVENTIL) (2.5 MG/3ML) 0.083% nebulizer solution 2.5 mg ( Inhalation MAR Hold 06/26/16 1539)  methocarbamol (ROBAXIN) tablet 500 mg (  Oral MAR Hold 06/26/16 1539)    Or  methocarbamol (ROBAXIN) 500 mg in dextrose 5 % 50 mL IVPB ( Intravenous MAR Hold 06/26/16 1539)  chlorhexidine (HIBICLENS) 4 % liquid 4 application (not administered)  povidone-iodine 10 % swab 2 application (not administered)  lactated ringers infusion ( Intravenous Anesthesia Volume Adjustment 06/26/16 1826)  HYDROmorphone (DILAUDID) injection 0.25-0.5 mg (not administered)  meperidine (DEMEROL) injection 6.25-12.5 mg (not administered)  ondansetron (ZOFRAN) injection 4 mg (not administered)  povidone-iodine 10 % swab 2 application (not administered)  tranexamic acid (CYKLOKAPRON) 2,000 mg in sodium chloride 0.9 % 50 mL Topical Application (not administered)  HYDROmorphone (DILAUDID) 1 MG/ML injection (not administered)  fentaNYL (SUBLIMAZE) injection 50 mcg (50 mcg Intravenous Given 06/26/16 1140)  ondansetron (ZOFRAN) injection 4 mg (4 mg Intravenous Given 06/26/16 1140)  diazepam (VALIUM) injection 5 mg (5 mg Intravenous Given 06/26/16 1254)  ceFAZolin (ANCEF) IVPB 2g/100 mL premix (2 g Intravenous Given 06/26/16 1640)  ceFAZolin (ANCEF) 2-4 GM/100ML-% IVPB (  Override pull for Anesthesia 06/26/16 1640)  tranexamic acid (CYKLOKAPRON) 1,000 mg in sodium chloride 0.9 % 100 mL IVPB (1,000 mg Intravenous New Bag/Given 06/26/16 1640)  tranexamic acid (CYKLOKAPRON) 2,000 mg in sodium chloride 0.9 % 50 mL Topical Application (8,329 mg Topical New Bag/Given 06/26/16 1706)     Initial Impression / Assessment and Plan / ED Course  I have reviewed the triage vital signs and the nursing notes.  Pertinent labs & imaging results that were available during my care of the patient were reviewed by me and considered in my medical decision making (see chart for details).    83 year old female with a history of coronary artery disease, hypertension, hyperlipidemia presents with concern for fall with left hip pain.  Denies other injuries from the fall. Have low suspicion for  intracranial injury and given no signs  of head trauma on exam, no headache, no vomiting, no blood thinners. No midline neck tenderness. Patient with left femoral neck fracture on x-ray. Orthopedics, Dr. Erlinda Hong was consulted and patient was admitted to hospitalist for further care.  Final Clinical Impressions(s) / ED Diagnoses   Final diagnoses:  Fall  Closed fracture of left hip, initial encounter [facility])    New Prescriptions Current Discharge Medication List    START taking these medications   Details  enoxaparin (LOVENOX) 40 MG/0.4ML injection Inject 0.4 mLs (40 mg total) into the skin daily. Qty: 14 Syringe, Refills: 0    oxyCODONE (OXY IR/ROXICODONE) 5 MG immediate release tablet Take 1-3 tablets (5-15 mg total) by mouth every 4 (four) hours as needed. Qty: 90 tablet, Refills: 0         Gareth Morgan, MD 06/26/16 1856

## 2016-06-26 NOTE — ED Notes (Signed)
ED Provider at bedside. 

## 2016-06-26 NOTE — Anesthesia Postprocedure Evaluation (Signed)
Anesthesia Post Note  Patient: Theresa Crosby  Procedure(s) Performed: Procedure(s) (LRB): ANTERIOR APPROACH TOTAL HIP ARTHROPLASTY (Left)  Patient location during evaluation: PACU Anesthesia Type: General Level of consciousness: awake and alert Pain management: pain level controlled Vital Signs Assessment: post-procedure vital signs reviewed and stable Respiratory status: spontaneous breathing, nonlabored ventilation, respiratory function stable and patient connected to nasal cannula oxygen Cardiovascular status: blood pressure returned to baseline and stable Postop Assessment: no signs of nausea or vomiting Anesthetic complications: no       Last Vitals:  Vitals:   06/26/16 1924 06/26/16 1930  BP:  (!) 142/83  Pulse: 98 96  Resp: 19 13  Temp:      Last Pain:  Vitals:   06/26/16 1833  TempSrc:   PainSc: 0-No pain                 Nayan Proch,W. EDMOND

## 2016-06-26 NOTE — Progress Notes (Signed)
Blood bank called states that they have 2 units ready, I stated patient was still in surgery Theresa Crosby states she will call OR to let them know

## 2016-06-26 NOTE — Consult Note (Signed)
Reason for Consult:Left femoral neck fx Referring Physician: Josepha Pigg  Theresa Crosby is an 83 y.o. female.  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 She had immediate pain, heard a crack, and could not get up. She denies any presyncopal symptoms or LOC. X-rays showed a left femoral neck fx and orthopedic surgery was consulted.  Past Medical History:  Diagnosis Date  . Abnormal nuclear stress test 10/05/2011  . Anxiety    2005- related to care of aging parent   . Arthritis    degeneration - spondylosis, OA - all over body,   . Blood transfusion    postop- back surg. 1987  . Bunion   . CAD (coronary artery disease)significant diseas with 99% LAD, 80% 1st diag, 80% LCX 10/05/2011  . Chest pain at rest associated with dyspnea 10/05/2011  . GERD (gastroesophageal reflux disease)   . Hyperlipemia   . Hypertension    ireg. heartbeat, no referral made to cardiologist , sees [facility]. Med.   . Neuromuscular disorder (HCC)    burning- L leg , numbing of toes   . S/P CABG x 4, LIMA-LAD, SVG-OM2, SVG-diag, SVG-RCA secondary to symptomatic CAD and positive stress test 10/05/2011  . Shortness of breath     Past Surgical History:  Procedure Laterality Date  . ABDOMINAL HYSTERECTOMY     partial hysterectomy- 1972  . BACK SURGERY     1987-   . BREAST CYST ASPIRATION  06/18/2010  . CARDIAC CATHETERIZATION  10/12/2011   high-grade ca+ prox. LAD diagonal branch,higly diseased diagonal branch & obtuse marginal branch  . CORONARY ARTERY BYPASS GRAFT  10/04/2011   Procedure: CORONARY ARTERY BYPASS GRAFTING (CABG);  Surgeon: Gaye Pollack, MD;  Location: [facility];  Service: Open Heart Surgery;  Laterality: N/A;  CABG x four using left internal mammary artery and right leg greater saphenous vein   . EYE SURGERY     cataracts removed- bilateral, no IOL  . FOOT SURGERY Right January 2013   HT 5  . LEFT HEART CATHETERIZATION WITH  CORONARY ANGIOGRAM N/A 10/02/2011   Procedure: LEFT HEART CATHETERIZATION WITH CORONARY ANGIOGRAM;  Surgeon: Lorretta Harp, MD;  Location: [facility] CATH LAB;  Service: Cardiovascular;  Laterality: N/A;  . LUMBAR LAMINECTOMY/DECOMPRESSION MICRODISCECTOMY  03/04/2011   Procedure: LUMBAR LAMINECTOMY/DECOMPRESSION MICRODISCECTOMY;  Surgeon: Hosie Spangle, MD;  Location: [place] [facility];  Service: Neurosurgery;  Laterality: N/A;  Thoracic Ten-Thoracic Twelve Thoracic Laminectomy  . NM MYOVIEW LTD  09/15/2011   mild ischemia mid anterior & apical anterior region.EF: 86%  . TOE SURGERY     R foot- removed calus between 5-4  . TUBAL LIGATION      Family History  Problem Relation Age of Onset  . Heart attack Mother   . Heart attack Father   . Hypertension Father   . Heart attack Brother   . Anesthesia problems Neg Hx   . Hypotension Neg Hx   . Malignant hyperthermia Neg Hx   . Pseudochol deficiency Neg Hx     Social History:  reports that she has never smoked. She has never used smokeless tobacco. She reports that she does not drink alcohol or use drugs.  Allergies:  Allergies  Allergen Reactions  . Aspirin Other (See Comments)    GI upset,high doses  . Lactose Intolerance (Gi) Other (See Comments)    unspecified    Medications: I have reviewed the patient's  current medications.  Results for orders placed or performed during the hospital encounter of 06/26/16 (from the past 48 hour(s))  CBC with Differential     Status: None   Collection Time: 06/26/16 11:27 AM  Result Value Ref Range   WBC 4.8 4.0 - 10.5 K/uL   RBC 4.35 3.87 - 5.11 MIL/uL   Hemoglobin 13.2 12.0 - 15.0 g/dL   HCT 39.7 36.0 - 46.0 %   MCV 91.3 78.0 - 100.0 fL   MCH 30.3 26.0 - 34.0 pg   MCHC 33.2 30.0 - 36.0 g/dL   RDW 14.9 11.5 - 15.5 %   Platelets 216 150 - 400 K/uL   Neutrophils Relative % 51 %   Neutro Abs 2.5 1.7 - 7.7 K/uL   Lymphocytes Relative 38 %   Lymphs Abs 1.9 0.7 - 4.0 K/uL   Monocytes Relative 6 %    Monocytes Absolute 0.3 0.1 - 1.0 K/uL   Eosinophils Relative 5 %   Eosinophils Absolute 0.2 0.0 - 0.7 K/uL   Basophils Relative 0 %   Basophils Absolute 0.0 0.0 - 0.1 K/uL  Comprehensive metabolic panel     Status: Abnormal   Collection Time: 06/26/16 11:27 AM  Result Value Ref Range   Sodium 139 135 - 145 mmol/L   Potassium 4.2 3.5 - 5.1 mmol/L   Chloride 105 101 - 111 mmol/L   CO2 26 22 - 32 mmol/L   Glucose, Bld 118 (H) 65 - 99 mg/dL   BUN 8 6 - 20 mg/dL   Creatinine, Ser 0.65 0.44 - 1.00 mg/dL   Calcium 9.4 8.9 - 10.3 mg/dL   Total Protein 6.7 6.5 - 8.1 g/dL   Albumin 4.4 3.5 - 5.0 g/dL   AST 29 15 - 41 U/L   ALT 21 14 - 54 U/L   Alkaline Phosphatase 83 38 - 126 U/L   Total Bilirubin 0.3 0.3 - 1.2 mg/dL   GFR calc non Af Amer >60 >60 mL/min   GFR calc Af Amer >60 >60 mL/min    Comment: (NOTE) The eGFR has been calculated using the CKD EPI equation. This calculation has not been validated in all clinical situations. eGFR's persistently <60 mL/min signify possible Chronic Kidney Disease.    Anion gap 8 5 - 15    Dg Chest Port 1 View  Result Date: 06/26/2016 CLINICAL DATA:  Preoperative examination. EXAM: PORTABLE CHEST 1 VIEW COMPARISON:  02/24/2016 FINDINGS: Prior median sternotomy. Both lungs are clear. Heart size is normal. The trachea is midline. Negative for a pneumothorax. Bony thorax is intact. IMPRESSION: No active disease. Electronically Signed   By: Markus Daft M.D.   On: 06/26/2016 14:00   Dg Hip Unilat With Pelvis 2-3 Views Left  Result Date: 06/26/2016 CLINICAL DATA:  Recent fall with left hip pain, initial encounter EXAM: DG HIP (WITH OR WITHOUT PELVIS) 2-3V LEFT COMPARISON:  None. FINDINGS: There is a left femoral neck fracture with impaction and angulation at the fracture site. The femoral head remains well seated. The pelvic ring is intact. IMPRESSION: Left femoral neck fracture with impaction Electronically Signed   By: Inez Catalina M.D.   On: 06/26/2016  12:12    Review of Systems  Constitutional: Negative for weight loss.  HENT: Negative for ear discharge, ear pain, hearing loss and tinnitus.   Eyes: Negative for blurred vision, double vision, photophobia and pain.  Respiratory: Negative for cough, sputum production and shortness of breath.   Cardiovascular: Negative for chest pain.  Gastrointestinal: Negative for abdominal pain, nausea and vomiting.  Genitourinary: Negative for dysuria, flank pain, frequency and urgency.  Musculoskeletal: Positive for joint pain (Left hip). Negative for back pain, falls, myalgias and neck pain.  Neurological: Negative for dizziness, tingling, sensory change, focal weakness, loss of consciousness and headaches.  Endo/Heme/Allergies: Does not bruise/bleed easily.  Psychiatric/Behavioral: Negative for depression, memory loss and substance abuse. The patient is not nervous/anxious.    Blood pressure 138/77, pulse 80, temperature 98.1 F (36.7 C), temperature source Oral, resp. rate 16, height '4\' 11"'  (1.499 m), weight 46.4 kg (102 lb 6.4 oz), SpO2 100 %. Physical Exam  Constitutional: She appears well-developed and well-nourished. No distress.  HENT:  Head: Normocephalic.  Eyes: Conjunctivae are normal. Right eye exhibits no discharge. Left eye exhibits no discharge. No scleral icterus.  Cardiovascular: Normal rate and regular rhythm.   Respiratory: Effort normal. No respiratory distress.  Musculoskeletal:  Bilateral shoulder, elbow, wrist, digits- no skin wounds, nontender, no instability, no blocks to motion  Sens  Ax/R/M/U intact  Mot   Ax/ R/ PIN/ M/ AIN/ U intact  Rad 2+  RLE No traumatic wounds, ecchymosis, or rash  Nontender  No effusions  Knee stable to varus/ valgus and anterior/posterior stress  Sens DPN, SPN, TN intact  Motor EHL, ext, flex, evers 5/5  DP 2+, PT 2+, No significant edema   LLE No traumatic wounds, ecchymosis, or rash  TTP hip, leg somewhat shortened  No  effusions  Knee stable to varus/ valgus and anterior/posterior stress  Sens DPN, SPN, TN intact  Motor EHL, ext, flex, evers 5/5  DP 2+, PT 2+, No significant edema     Neurological: She is alert.  Skin: Skin is warm and dry. She is not diaphoretic.  Psychiatric: She has a normal mood and affect. Her behavior is normal.    Assessment/Plan: Fall Left femoral neck fx -- Plan for anterior hemiarthroplasty this evening. NPO until then. Vaginal tumor -- Continue f/u with [facility] as OP Multiple medical problems -- per primary service    Lisette Abu, PA-C Orthopedic Surgery [phone] 06/26/2016, 2:50 PM

## 2016-06-26 NOTE — ED Triage Notes (Signed)
Pt presents with shortening and rotation noted to L leg after tripping on a chair leg and falling onto cement floor. -LOC, received 155mcg of fentanyl PTA

## 2016-06-26 NOTE — Anesthesia Procedure Notes (Signed)
Procedure Name: Intubation Date/Time: 06/26/2016 4:30 PM Performed by: Lance Coon Pre-anesthesia Checklist: Patient identified, Emergency Drugs available, Patient being monitored, Timeout performed and Suction available Patient Re-evaluated:Patient Re-evaluated prior to inductionOxygen Delivery Method: Circle system utilized Preoxygenation: Pre-oxygenation with 100% oxygen Intubation Type: IV induction Ventilation: Mask ventilation without difficulty Laryngoscope Size: Miller and 3 Grade View: Grade I Tube type: Oral Tube size: 7.5 mm Number of attempts: 1 Airway Equipment and Method: Stylet Placement Confirmation: ETT inserted through vocal cords under direct vision,  positive ETCO2 and breath sounds checked- equal and bilateral Secured at: 21 cm Tube secured with: Tape Dental Injury: Teeth and Oropharynx as per pre-operative assessment

## 2016-06-26 NOTE — Discharge Instructions (Signed)
° ° °  1. Change dressings as needed °2. May shower but keep incisions covered and dry °3. Take lovenox to prevent blood clots °4. Take stool softeners as needed °5. Take pain meds as needed ° °

## 2016-06-26 NOTE — H&P (Signed)
History and Physical    Theresa Crosby CHE:[id number] DOB: 02-28-1932 DOA: 06/26/2016   PCP: System, [place] Not In [facility]  Patient coming from/Resides with: Private residence/alone  Admission status: Inpatient/floor -medically necessary to stay a minimum 2 midnights to rule out impending and/or unexpected changes in physiologic status that may differ from initial evaluation performed in the ER and/or at time of admission. Resides with mechanical fall and has sustained a left femoral neck fracture with impaction. She will require surgical intervention this admission. Postoperatively she will quire PT/OT evaluation to determine discharge disposition. Patient lives alone and will likely need to dispo to a short-term rehabilitation facility.  Chief Complaint: Fall with left leg pain  HPI: Theresa Crosby is a 83 y.o. female with medical history significant for CAD with prior CABG procedure in 2013 by Dr. Cyndia Bent, hypertension, dyslipidemia, anxiety disorder and asthma-like syndrome. Patient tripped over a chair and fell. She did not express an loss of consciousness. She has since experienced significant thigh pain and x-ray in the ER reveals a left femoral neck fracture that is impacted. Orthopedic consultation pending.  ED Course:  Vital Signs: BP 133/85   Pulse 74   Temp 98.1 F (36.7 C) (Oral)   Resp 13   Ht 4\' 11"  (1.499 m)   Wt 46.4 kg (102 lb 6.4 oz)   SpO2 96%   BMI 20.68 kg/m  DG left hip: Left femoral neck fracture with impaction Lab data: Sodium 139, potassium 4.2, chloride 15, CO2 26, glucose 118, BUN 8, creatinine 0.65, LFTs normal, white count 4800 with normal differential, hemoglobin 13.2, platelets 216,000 Medications and treatments: Fentanyl 50 g IV 1, Zofran 4 mg IV 1, Valium 5 mg IV 1  Review of Systems:  In addition to the HPI above,  No Fever-chills, myalgias or other constitutional symptoms No Headache, changes with Vision or hearing, new  weakness, tingling, numbness in any extremity, dizziness, dysarthria or word finding difficulty; patient reports that she believes her mobility is "getting worse"and states she does not use an assistive device and last did use a device after open heart surgery, no tremors or seizure activity No problems swallowing food or Liquids, indigestion/reflux, choking or coughing while eating, abdominal pain with or after eating No Chest pain, Cough or Shortness of Breath, palpitations, orthopnea or DOE No Abdominal pain, N/V, melena,hematochezia, dark tarry stools, constipation No dysuria, malodorous urine, hematuria or flank pain No new skin rashes, lesions, masses or bruises, No new joint redness No recent unintentional weight gain or loss No polyuria, polydypsia or polyphagia   Past Medical History:  Diagnosis Date  . Abnormal nuclear stress test 10/05/2011  . Anxiety    2005- related to care of aging parent   . Arthritis    degeneration - spondylosis, OA - all over body,   . Blood transfusion    postop- back surg. 1987  . Bunion   . CAD (coronary artery disease)significant diseas with 99% LAD, 80% 1st diag, 80% LCX 10/05/2011  . Chest pain at rest associated with dyspnea 10/05/2011  . GERD (gastroesophageal reflux disease)   . Hyperlipemia   . Hypertension    ireg. heartbeat, no referral made to cardiologist , sees [facility]. Med.   . Neuromuscular disorder (HCC)    burning- L leg , numbing of toes   . S/P CABG x 4, LIMA-LAD, SVG-OM2, SVG-diag, SVG-RCA secondary to symptomatic CAD and positive stress test 10/05/2011  . Shortness of breath     Past  Surgical History:  Procedure Laterality Date  . ABDOMINAL HYSTERECTOMY     partial hysterectomy- 1972  . BACK SURGERY     1987-   . BREAST CYST ASPIRATION  06/18/2010  . CARDIAC CATHETERIZATION  10/12/2011   high-grade ca+ prox. LAD diagonal branch,higly diseased diagonal branch & obtuse marginal branch  . CORONARY ARTERY BYPASS GRAFT  10/04/2011     Procedure: CORONARY ARTERY BYPASS GRAFTING (CABG);  Surgeon: Gaye Pollack, MD;  Location: [place] [facility];  Service: Open Heart Surgery;  Laterality: N/A;  CABG x four using left internal mammary artery and right leg greater saphenous vein   . EYE SURGERY     cataracts removed- bilateral, no IOL  . FOOT SURGERY Right January 2013   HT 5  . LEFT HEART CATHETERIZATION WITH CORONARY ANGIOGRAM N/A 10/02/2011   Procedure: LEFT HEART CATHETERIZATION WITH CORONARY ANGIOGRAM;  Surgeon: Lorretta Harp, MD;  Location: [facility] CATH LAB;  Service: Cardiovascular;  Laterality: N/A;  . LUMBAR LAMINECTOMY/DECOMPRESSION MICRODISCECTOMY  03/04/2011   Procedure: LUMBAR LAMINECTOMY/DECOMPRESSION MICRODISCECTOMY;  Surgeon: Hosie Spangle, MD;  Location: [place] [facility];  Service: Neurosurgery;  Laterality: N/A;  Thoracic Ten-Thoracic Twelve Thoracic Laminectomy  . NM MYOVIEW LTD  09/15/2011   mild ischemia mid anterior & apical anterior region.EF: 86%  . TOE SURGERY     R foot- removed calus between 5-4  . TUBAL LIGATION      Social History   Social History  . Marital status: Widowed    Spouse name: N/A  . Number of children: N/A  . Years of education: N/A   Occupational History  . Not on file.   Social History Main Topics  . Smoking status: Never Smoker  . Smokeless tobacco: Never Used  . Alcohol use No  . Drug use: No  . Sexual activity: Not on file   Other Topics Concern  . Not on file   Social History Narrative  . No narrative on file    Mobility: Independent but volunteers concerns over worsening mobility Work history: Not obtained   Allergies  Allergen Reactions  . Aspirin Other (See Comments)    GI upset,high doses  . Lactose Intolerance (Gi) Other (See Comments)    unspecified    Family History  Problem Relation Age of Onset  . Heart attack Mother   . Heart attack Father   . Hypertension Father   . Heart attack Brother   . Anesthesia problems Neg Hx   . Hypotension Neg Hx   .  Malignant hyperthermia Neg Hx   . Pseudochol deficiency Neg Hx      Prior to Admission medications   Medication Sig Start Date End Date Taking? Authorizing Provider  ADVAIR DISKUS 100-50 MCG/DOSE AEPB Inhale 1 puff into the lungs every 12 (twelve) hours as needed for wheezing. 05/22/16  Yes [provider]  albuterol (PROVENTIL HFA;VENTOLIN HFA) 108 (90 BASE) MCG/ACT inhaler Inhale 1-2 puffs into the lungs every 6 (six) hours as needed for wheezing or shortness of breath.   Yes [provider]  amLODipine (NORVASC) 5 MG tablet Take 5 mg by mouth daily.     Yes [provider]  aspirin EC 81 MG tablet Take 1 tablet (81 mg total) by mouth daily. 11/02/11  Yes Oti, Brantley Stage, MD  Cholecalciferol (VITAMIN D3) 1000 UNITS CAPS Take 2,000 Units by mouth every morning.     Yes [provider]  diclofenac sodium (VOLTAREN) 1 % GEL Apply 1 g topically daily  as needed for pain. 06/05/16  Yes [provider]  dorzolamide-timolol (COSOPT) 22.3-6.8 MG/ML ophthalmic solution Place 1 drop into both eyes 2 (two) times daily. 06/10/16  Yes [provider]  fluticasone (FLONASE) 50 MCG/ACT nasal spray Place 2 sprays into the nose as needed for rhinitis.   Yes [provider]  latanoprost (XALATAN) 0.005 % ophthalmic solution Place 1 drop into both eyes at bedtime.     Yes [provider]  loratadine (CLARITIN) 10 MG tablet Take 10 mg by mouth daily as needed for allergies.   Yes [provider]  Magnesium Oxide 500 MG TABS Take 500 mg by mouth daily.   Yes [provider]  metoprolol tartrate (LOPRESSOR) 25 MG tablet Take 25 mg by mouth 2 (two) times daily.    Yes [provider]  nitroGLYCERIN (NITROSTAT) 0.4 MG SL tablet Place 0.4 mg under the tongue every 5 (five) minutes as needed for chest pain.   Yes [provider]  pantoprazole (PROTONIX) 40 MG tablet Take 40 mg by mouth daily.   Yes [provider]   rosuvastatin (CRESTOR) 10 MG tablet Take 1 tablet (10 mg total) by mouth daily. 01/28/15  Yes Almyra Deforest, PA  cetirizine (ZYRTEC ALLERGY) 10 MG tablet Take 1 tablet (10 mg total) by mouth daily. Patient not taking: Reported on 06/26/2016 01/28/15   Almyra Deforest, PA  oxybutynin (DITROPAN XL) 10 MG 24 hr tablet Take 1 tablet (10 mg total) by mouth at bedtime. Patient not taking: Reported on 06/26/2016 02/18/16   Mackuen, Courteney Lyn, MD  polyethylene glycol (MIRALAX) packet Take 17 g by mouth daily. Patient not taking: Reported on 06/26/2016 02/18/16   Macarthur Critchley, MD    Physical Exam: Vitals:   06/26/16 1145 06/26/16 1230 06/26/16 1230 06/26/16 1245  BP: (!) 149/77 (!) 161/84 (!) 161/84 133/85  Pulse: 61 93 72 74  Resp:   18 13  Temp:      TempSrc:      SpO2: 97% 100% 95% 96%  Weight:      Height:          Constitutional: NAD, calm, Much more comfortable after administration of medications for pain and anxiety Eyes: PERRL, lids and conjunctivae normal ENMT: Mucous membranes are moist. Posterior pharynx clear of any exudate or lesions.Normal dentition.  Neck: normal, supple, no masses, no thyromegaly Respiratory: clear to auscultation bilaterally, no wheezing, no crackles. Normal respiratory effort. No accessory muscle use.  Cardiovascular: Regular rate and rhythm, no murmurs / rubs / gallops. No extremity edema. 2+ pedal pulses. No carotid bruits.  Abdomen: no tenderness, no masses palpated. No hepatosplenomegaly. Bowel sounds positive.  Musculoskeletal: no clubbing / cyanosis. No joint deformity upper extremities. Left leg shortened and rotated with known fracture of left femoral neck without significant edema in left thigh and no definitive ecchymosis Good ROM, no contractures. Normal muscle tone.  Skin: no rashes, lesions, ulcers. No induration Neurologic: CN 2-12 grossly intact. Sensation intact, DTR normal. Strength 5/5 x all 4 extremities.  Psychiatric: Normal judgment and  insight. Alert and oriented x 3. Normal mood.    Labs on Admission: I have personally reviewed following labs and imaging studies  CBC:  Recent Labs Lab 06/26/16 1127  WBC 4.8  NEUTROABS 2.5  HGB 13.2  HCT 39.7  MCV 91.3  PLT 272   Basic Metabolic Panel:  Recent Labs Lab 06/26/16 1127  NA 139  K 4.2  CL 105  CO2 26  GLUCOSE  118*  BUN 8  CREATININE 0.65  CALCIUM 9.4   GFR: Estimated Creatinine Clearance: 37 mL/min (by C-G formula based on SCr of 0.65 mg/dL). Liver Function Tests:  Recent Labs Lab 06/26/16 1127  AST 29  ALT 21  ALKPHOS 83  BILITOT 0.3  PROT 6.7  ALBUMIN 4.4   No results for input(s): LIPASE, AMYLASE in the last 168 hours. No results for input(s): AMMONIA in the last 168 hours. Coagulation Profile: No results for input(s): INR, PROTIME in the last 168 hours. Cardiac Enzymes: No results for input(s): CKTOTAL, CKMB, CKMBINDEX, TROPONINI in the last 168 hours. BNP (last 3 results) No results for input(s): PROBNP in the last 8760 hours. HbA1C: No results for input(s): HGBA1C in the last 72 hours. CBG: No results for input(s): GLUCAP in the last 168 hours. Lipid Profile: No results for input(s): CHOL, HDL, LDLCALC, TRIG, CHOLHDL, LDLDIRECT in the last 72 hours. Thyroid Function Tests: No results for input(s): TSH, T4TOTAL, FREET4, T3FREE, THYROIDAB in the last 72 hours. Anemia Panel: No results for input(s): VITAMINB12, FOLATE, FERRITIN, TIBC, IRON, RETICCTPCT in the last 72 hours. Urine analysis:    Component Value Date/Time   COLORURINE YELLOW 02/18/2016 1600   APPEARANCEUR CLOUDY (A) 02/18/2016 1600   LABSPEC 1.011 02/18/2016 1600   PHURINE 7.5 02/18/2016 1600   GLUCOSEU NEGATIVE 02/18/2016 1600   HGBUR NEGATIVE 02/18/2016 1600   BILIRUBINUR NEGATIVE 02/18/2016 1600   KETONESUR NEGATIVE 02/18/2016 1600   PROTEINUR NEGATIVE 02/18/2016 1600   UROBILINOGEN 1.0 11/28/2014 1410   NITRITE NEGATIVE 02/18/2016 1600   LEUKOCYTESUR TRACE  (A) 02/18/2016 1600   Sepsis Labs: @LABRCNTIP (procalcitonin:4,lacticidven:4) )No results found for this or any previous visit (from the past 240 hour(s)).   Radiological Exams on Admission: Dg Hip Unilat With Pelvis 2-3 Views Left  Result Date: 06/26/2016 CLINICAL DATA:  Recent fall with left hip pain, initial encounter EXAM: DG HIP (WITH OR WITHOUT PELVIS) 2-3V LEFT COMPARISON:  None. FINDINGS: There is a left femoral neck fracture with impaction and angulation at the fracture site. The femoral head remains well seated. The pelvic ring is intact. IMPRESSION: Left femoral neck fracture with impaction Electronically Signed   By: Inez Catalina M.D.   On: 06/26/2016 12:12    EKG: (Independently reviewed) Sinus rhythm with ventricular rate 68 bpm, QTC 454 ms, no acute ischemic changes, elevated J point in inferior leads  Assessment/Plan Principal Problem:   Left displaced femoral neck fracture (impacted) -Patient presents with leg pain after mechanical fall with x-ray demonstrating femoral neck fracture -Formal orthopedic consultation pending -NPO/hip fracture orders that initiated -Surgical risk: Lyndel Safe Perioperative: 0.22%--Geriatric-Sensitive Perioperative Cardiac Risk Index: 0.2%--- Revised Cardiac Risk Index Truman Hayward Criteria) 0.9%--all of these indicate a LOW SURGICAL RISK -Obtain vitamin D/osteoporosis screening labs -Anticipate PT/OT postoperatively -IV narcotics with IV skeletal muscular relaxants PRN while NPO  Active Problems:   Hypertension, essential -Current blood pressure moderately controlled given pain -Continue Norvasc and Lopressor    Coronary heart disease -Has been asymptomatic -Preoperative EKG unremarkable -Continue beta blocker, statin and aspirin -Risk factor stratification as above -Last cardiology visit in 2016 demonstrated issues with chronic persistent chest wall pain and dyspnea on exertion -Stress Myoview in 2016: Low risk study with EF 87%  Left ventricular  diastolic dysfunction/grade 2 -Documented on echo without history of congestive heart failure or requirement for diuretics -Monitor closely while receiving IV fluids -Keep blood pressure well controlled -Does have a history of reported chronic dyspnea on exertion -Last echo in 2016 with  normal cavity size, normal aortic valve and no pulmonary hypertension    HLD (hyperlipidemia) -Continue statin    Asthma -Does not have a formal diagnosis but chronic dyspnea on exertion has improved with the addition of Advair and albuterol as well as treatment for seasonal allergies      DVT prophylaxis: SCDs with transition to pharmacological DVT prophylaxis at discretion of orthopedic surgery Code Status: Full Family Communication: Family in [place] Disposition Plan: To be determined pending postoperative PT/OT evaluation Consults called: Orthopedics/Xu    Crosby,Theresa L. ANP-BC [facility] Pager [phone]   If 7PM-7AM, please contact night-coverage [email or web address] Password [email or web address]  06/26/2016, 1:18 PM

## 2016-06-26 NOTE — Transfer of Care (Signed)
Immediate Anesthesia Transfer of Care Note  Patient: Theresa Crosby  Procedure(s) Performed: Procedure(s): ANTERIOR APPROACH TOTAL HIP ARTHROPLASTY (Left)  Patient Location: PACU  Anesthesia Type:General  Level of Consciousness: awake and alert   Airway & Oxygen Therapy: Patient Spontanous Breathing and Patient connected to nasal cannula oxygen  Post-op Assessment: Report given to RN and Post -op Vital signs reviewed and stable  Post vital signs: Reviewed and stable  Last Vitals:  Vitals:   06/26/16 1833 06/26/16 1838  BP: 131/84   Pulse: 92 89  Resp:  20  Temp: 36.2 C     Last Pain:  Vitals:   06/26/16 1833  TempSrc:   PainSc: (P) 0-No pain         Complications: No apparent anesthesia complications

## 2016-06-26 NOTE — ED Notes (Signed)
Attempted report 

## 2016-06-26 NOTE — Anesthesia Preprocedure Evaluation (Signed)
Anesthesia Evaluation  Patient identified by MRN, date of birth, ID band Patient awake    Reviewed: Allergy & Precautions, NPO status , Patient's Chart, lab work & pertinent test results  Airway Mallampati: I  TM Distance: >3 FB     Dental   Pulmonary asthma ,    Pulmonary exam normal        Cardiovascular hypertension, Pt. on medications + CAD and + CABG  Normal cardiovascular exam     Neuro/Psych    GI/Hepatic GERD  Medicated and Controlled,  Endo/Other    Renal/GU      Musculoskeletal   Abdominal   Peds  Hematology   Anesthesia Other Findings   Reproductive/Obstetrics                             Anesthesia Physical Anesthesia Plan  ASA: III  Anesthesia Plan: General   Post-op Pain Management:    Induction: Intravenous  Airway Management Planned: Oral ETT  Additional Equipment:   Intra-op Plan:   Post-operative Plan: Extubation in OR  Informed Consent: I have reviewed the patients History and Physical, chart, labs and discussed the procedure including the risks, benefits and alternatives for the proposed anesthesia with the patient or authorized representative who has indicated his/her understanding and acceptance.     Plan Discussed with: CRNA and Surgeon  Anesthesia Plan Comments:         Anesthesia Quick Evaluation

## 2016-06-27 ENCOUNTER — Encounter (HOSPITAL_COMMUNITY): Payer: Self-pay

## 2016-06-27 LAB — BASIC METABOLIC PANEL
Anion gap: 11 (ref 5–15)
BUN: 10 mg/dL (ref 6–20)
CO2: 22 mmol/L (ref 22–32)
Calcium: 9 mg/dL (ref 8.9–10.3)
Chloride: 104 mmol/L (ref 101–111)
Creatinine, Ser: 0.7 mg/dL (ref 0.44–1.00)
GFR calc Af Amer: >60 mL/min (ref >60)
GFR calc non Af Amer: >60 mL/min (ref >60)
Glucose, Bld: 134 mg/dL — ABNORMAL HIGH (ref 65–99)
Potassium: 3.7 mmol/L (ref 3.5–5.1)
Sodium: 137 mmol/L (ref 135–145)

## 2016-06-27 LAB — CBC
HCT: 33.1 % — ABNORMAL LOW (ref 36.0–46.0)
Hemoglobin: 10.7 g/dL — ABNORMAL LOW (ref 12.0–15.0)
MCH: 29.4 pg (ref 26.0–34.0)
MCHC: 32.3 g/dL (ref 30.0–36.0)
MCV: 90.9 fL (ref 78.0–100.0)
Platelets: 179 10*3/uL (ref 150–400)
RBC: 3.64 MIL/uL — ABNORMAL LOW (ref 3.87–5.11)
RDW: 14.8 % (ref 11.5–15.5)
WBC: 12.5 10*3/uL — ABNORMAL HIGH (ref 4.0–10.5)

## 2016-06-27 LAB — VITAMIN D 25 HYDROXY (VIT D DEFICIENCY, FRACTURES): Vit D, 25-Hydroxy: 43.8 ng/mL (ref 30.0–100.0)

## 2016-06-27 MED ORDER — POLYETHYLENE GLYCOL 3350 17 G PO PACK
17.0000 g | PACK | Freq: Every day | ORAL | Status: DC
  Administered 2016-06-27 – 2016-06-29 (×3): 17 g via ORAL
  Filled 2016-06-27 (×4): qty 1

## 2016-06-27 MED ORDER — SENNOSIDES-DOCUSATE SODIUM 8.6-50 MG PO TABS
1.0000 | ORAL_TABLET | Freq: Two times a day (BID) | ORAL | Status: DC
  Administered 2016-06-27 – 2016-06-29 (×5): 1 via ORAL
  Filled 2016-06-27 (×7): qty 1

## 2016-06-27 NOTE — Progress Notes (Signed)
Physical Therapy Treatment Patient Details Name: Theresa Crosby MRN: [id number] DOB: 1932/08/22 Today's Date: 06/27/2016    History of Present Illness Pt is 83 yo female with c/o L thigh pain s/p fall on 06/26/16. dx L intertrochanteric fx s/p L THA 06/26/16. PMH significant for CAD/CABG, HTN, L ventricular dysfunction,OA, spondolysithesis, and CA.      PT Comments    Pt demonstrates improved tolerance for bed mobs, transfers and gait this session. Pt is able to perform increased gait distance to recliner. Pt will benefit from continued acute PT services in order to address below deficits and improve transfers and gait.     Follow Up Recommendations  SNF;Supervision/Assistance - 24 hour     Equipment Recommendations  Rolling walker with 5" wheels;Other (comment) (youth, defer to next venue)    Recommendations for Other Services       Precautions / Restrictions Precautions Precautions: Anterior Hip;Fall Precaution Booklet Issued: Yes (comment) Restrictions Weight Bearing Restrictions: Yes LLE Weight Bearing: Weight bearing as tolerated    Mobility  Bed Mobility Overal bed mobility: Needs Assistance Bed Mobility: Supine to Sit     Supine to sit: Min assist     General bed mobility comments: Min A to bring LE's EOB and to scoot toward EOB  Transfers Overall transfer level: Needs assistance Equipment used: Rolling walker (2 wheeled) Transfers: Sit to/from [facility] Sit to Stand: Min assist Stand pivot transfers: Min assist       General transfer comment: MIn A to power up from EOB to RW, Min A throughout transfer to [facility]  Ambulation/Gait Ambulation/Gait assistance: Min assist Ambulation Distance (Feet): 10 Feet Assistive device: Rolling walker (2 wheeled) Gait Pattern/deviations: Step-to pattern;Decreased stance time - left;Decreased step length - right;Decreased weight shift to left;Shuffle;Trunk flexed;Antalgic Gait velocity: decreased Gait  velocity interpretation: Below normal speed for age/gender General Gait Details: Improved cues for proper sequencing. Requires cues for sequencing when walking posteriorly toward recliner.    Stairs            Wheelchair Mobility    Modified Rankin (Stroke Patients Only)       Balance Overall balance assessment: Needs assistance Sitting-balance support: Bilateral upper extremity supported;No upper extremity supported Sitting balance-Leahy Scale: Fair Sitting balance - Comments: Able to sit on BSC without support   Standing balance support: Bilateral upper extremity supported;During functional activity Standing balance-Leahy Scale: Poor Standing balance comment: relies on RW for stability in standing                            Cognition Arousal/Alertness: Awake/alert Behavior During Therapy: WFL for tasks assessed/performed Overall Cognitive Status: Within Functional Limits for tasks assessed                                        Exercises      General Comments        Pertinent Vitals/Pain Pain Assessment: No/denies pain    Home Living                      Prior Function            PT Goals (current goals can now be found in the care plan section) Acute Rehab PT Goals Patient Stated Goal: get strong enough to go home Progress towards PT goals: Progressing toward goals  Frequency    Min 5X/week      PT Plan Frequency needs to be updated    Co-evaluation              AM-PAC PT "6 Clicks" Daily Activity  Outcome Measure  Difficulty turning over in bed (including adjusting bedclothes, sheets and blankets)?: Total Difficulty moving from lying on back to sitting on the side of the bed? : Total Difficulty sitting down on and standing up from a chair with arms (e.g., wheelchair, bedside commode, etc,.)?: A Little Help needed moving to and from a bed to chair (including a wheelchair)?: A Little Help needed  walking in hospital room?: A Little Help needed climbing 3-5 steps with a railing? : Total 6 Click Score: 12    End of Session Equipment Utilized During Treatment: Gait belt Activity Tolerance: Patient tolerated treatment well Patient left: in chair;with call bell/phone within reach;with family/visitor present Nurse Communication: Mobility status PT Visit Diagnosis: Unsteadiness on feet (R26.81);Other abnormalities of gait and mobility (R26.89);History of falling (Z91.81);Muscle weakness (generalized) (M62.81);Other symptoms and signs involving the nervous system (R29.898)     Time: 6283-1517 PT Time Calculation (min) (ACUTE ONLY): 14 min  Charges:  $Therapeutic Activity: 8-22 mins                    G Codes:       Scheryl Marten PT, DPT  [phone]    Shanon Rosser 06/27/2016, 3:14 PM

## 2016-06-27 NOTE — Progress Notes (Signed)
Patient is sitting up in bed comfortably.  Pain well controlled NVI Up with PT, doing well overall

## 2016-06-27 NOTE — Progress Notes (Signed)
[facility]                                                                              Patient Demographics  Theresa Crosby, is a 83 y.o. female, DOB - Jun 29, 1932, FWY:[id number]  Admit date - 06/26/2016   Admitting Physician Elwin Mocha, MD  Outpatient Primary MD for the patient is System, Pcp Not In  Outpatient specialists:   LOS - 1  days    Chief Complaint  Patient presents with  . Fall       Brief summary   Theresa Crosby is a 83 y.o. female with medical history significant for CAD with prior CABG procedure in 2013 by Dr. Cyndia Bent, hypertension, dyslipidemia, anxiety disorder and asthma-like syndrome. Patient tripped over a chair and fell. She did not express an loss of consciousness. She has since experienced significant thigh pain and x-ray in the ER reveals a left femoral neck fracture that is impacted. Orthopedics was consulted, patient underwent total hip replacement on 5/11.   Assessment & Plan    Principal Problem:   Left displaced femoral neck fracture (HCC) - Status post mechanical fall, presented with leg pain, x-ray showed femoral neck fracture - Orthopedics was consulted, patient underwent total hip replacement on 5/11, postop day #1 - Pain controlled, placed on bowel regimen - Start PT today, anticipate skilled nursing facility placement for rehabilitation - DVT prophylaxis per orthopedics -H&H stable   Active Problems:   Hypertension, essential - Currently stable, continue Norvasc, Lopressor    HLD (hyperlipidemia) - Continue statin    Coronary heart disease - No complaints, asymptomatic, continue aspirin, statin, beta blocker - Stress test in 2016 low risk study    Asthma -Currently stable, no wheezing  Code Status: Full CODE STATUS DVT Prophylaxis:  Lovenox  Family Communication: Discussed in detail with the patient, all imaging results, lab results explained to the patient    Disposition Plan:Likely skilled  nursing facility on Monday   Time Spent in minutes   25 minutes  Procedures:  Operative Procedures  1. Total hip replacement; Left hip  Consultants:   Orthopedics   Antimicrobials:      Medications  Scheduled Meds: . amLODipine  5 mg Oral Daily  . aspirin EC  81 mg Oral Daily  . cholecalciferol  2,000 Units Oral BH-q7a  . dorzolamide-timolol  1 drop Both Eyes BID  . enoxaparin (LOVENOX) injection  40 mg Subcutaneous Q24H  . fluticasone  2 spray Each Nare Daily  . latanoprost  1 drop Both Eyes QHS  . magnesium oxide  400 mg Oral Daily  . metoprolol tartrate  25 mg Oral BID  . mometasone-formoterol  2 puff Inhalation BID  . pantoprazole  40 mg Oral Daily  . polyethylene glycol  17 g Oral Daily  . rosuvastatin  10 mg Oral Daily  . senna-docusate  1 tablet Oral BID   Continuous Infusions: . lactated ringers    . methocarbamol (ROBAXIN)  IV    . tranexamic acid    . tranexamic acid (CYKLOKAPRON) topical -INTRAOP     PRN Meds:.acetaminophen **OR** acetaminophen, albuterol, alum & mag hydroxide-simeth,  HYDROcodone-acetaminophen, loratadine, menthol-cetylpyridinium **OR** phenol, methocarbamol **OR** methocarbamol (ROBAXIN)  IV, metoCLOPramide **OR** metoCLOPramide (REGLAN) injection, morphine injection, ondansetron **OR** ondansetron (ZOFRAN) IV, oxyCODONE   Antibiotics   Anti-infectives    Start     Dose/Rate Route Frequency Ordered Stop   06/27/16 0600  ceFAZolin (ANCEF) IVPB 2g/100 mL premix  Status:  Discontinued     2 g 200 mL/hr over 30 Minutes Intravenous On call to O.R. 06/26/16 1600 06/26/16 1609   06/26/16 2130  ceFAZolin (ANCEF) IVPB 2g/100 mL premix     2 g 200 mL/hr over 30 Minutes Intravenous Every 6 hours 06/26/16 2119 06/27/16 0815   06/26/16 1545  ceFAZolin (ANCEF) IVPB 2g/100 mL premix     2 g 200 mL/hr over 30 Minutes Intravenous To [facility] 06/26/16 1525 06/26/16 1640   06/26/16 1532  ceFAZolin (ANCEF) 2-4 GM/100ML-% IVPB    Comments:   Schonewitz, Leigh   : cabinet override      06/26/16 1532 06/26/16 1640        Subjective:   Theresa Crosby was seen and examined today. Patient denies dizziness, chest pain, shortness of breath, abdominal pain, N/V/D/C, new weakness, numbess, tingling. No acute events overnight.    Objective:   Vitals:   06/26/16 1945 06/26/16 2015 06/27/16 0037 06/27/16 0515  BP:  140/84 137/79 139/82  Pulse: 87 86 79 94  Resp:  14 16 16   Temp: 98 F (36.7 C) 98.2 F (36.8 C) 98.2 F (36.8 C) 98.8 F (37.1 C)  TempSrc:  Oral Oral Oral  SpO2: 99% 98% 99% 99%  Weight:      Height:        Intake/Output Summary (Last 24 hours) at 06/27/16 1303 Last data filed at 06/27/16 0926  Gross per 24 hour  Intake             1770 ml  Output             1550 ml  Net              220 ml     Wt Readings from Last 3 Encounters:  06/26/16 46.4 kg (102 lb 6.4 oz)  02/18/16 47.2 kg (104 lb)  12/10/15 45.4 kg (100 lb)     Exam  General: Alert and oriented x 3, NAD  HEENT:  PERRLA, EOMI, Anicteric Sclera, mucous membranes moist.   Neck: Supple, no JVD, no masses  Cardiovascular: S1 S2 auscultated, no rubs, murmurs or gallops. Regular rate and rhythm.  Respiratory: Clear to auscultation bilaterally, no wheezing, rales or rhonchi  Gastrointestinal: Soft, nontender, nondistended, + bowel sounds  Ext: no cyanosis clubbing or edema  Neuro: No new deficits   Skin: No rashes  Psych: Normal affect and demeanor, alert and oriented x3    Data Reviewed:  I have personally reviewed following labs and imaging studies  Micro Results Recent Results (from the past 240 hour(s))  Surgical pcr screen     Status: None   Collection Time: 06/26/16  4:00 PM  Result Value Ref Range Status   MRSA, PCR NEGATIVE NEGATIVE Final   Staphylococcus aureus NEGATIVE NEGATIVE Final    Comment:        The Xpert SA Assay (FDA approved for NASAL specimens in patients over 32 years of age), is one component  of a comprehensive surveillance program.  Test performance has been validated by [facility] for patients greater than or equal to 24 year old. It is not intended to diagnose infection  nor to guide or monitor treatment.     Radiology Reports Pelvis Portable  Result Date: 06/26/2016 CLINICAL DATA:  Status post left hip arthroplasty. Initial encounter. EXAM: PORTABLE PELVIS 1-2 VIEWS COMPARISON:  CT of the abdomen and pelvis from 02/18/2016 FINDINGS: The patient's left hip arthroplasty is grossly unremarkable in appearance, without evidence of loosening or new fracture. The right hip joint is grossly unremarkable in appearance. Mild degenerative change is noted at the lower lumbar spine. The sacroiliac joints are grossly unremarkable. The visualized bowel gas pattern is within normal limits. Scattered soft tissue air is seen tracking about the left hip and left side of the pelvis. Overlying skin staples are seen. IMPRESSION: 1. Left hip arthroplasty is grossly unremarkable in appearance, without evidence of loosening or new fracture. 2. Scattered postoperative soft tissue air about the left hip and left side of the pelvis. Electronically Signed   By: Garald Balding M.D.   On: 06/26/2016 20:02   Dg Chest Port 1 View  Result Date: 06/26/2016 CLINICAL DATA:  Preoperative examination. EXAM: PORTABLE CHEST 1 VIEW COMPARISON:  02/24/2016 FINDINGS: Prior median sternotomy. Both lungs are clear. Heart size is normal. The trachea is midline. Negative for a pneumothorax. Bony thorax is intact. IMPRESSION: No active disease. Electronically Signed   By: Markus Daft M.D.   On: 06/26/2016 14:00   Dg C-arm 61-120 Min-no Report  Result Date: 06/26/2016 Fluoroscopy was utilized by the requesting physician.  No radiographic interpretation.   Mm Screening Breast Tomo Bilateral  Result Date: 05/29/2016 CLINICAL DATA:  Screening. EXAM: 2D DIGITAL SCREENING BILATERAL MAMMOGRAM WITH CAD AND ADJUNCT TOMO COMPARISON:   Previous exam(s). ACR Breast Density Category c: The breast tissue is heterogeneously dense, which may obscure small masses. FINDINGS: There are no findings suspicious for malignancy. Images were processed with CAD. IMPRESSION: No mammographic evidence of malignancy. A result letter of this screening mammogram will be mailed directly to the patient. RECOMMENDATION: Screening mammogram in one year. (Code:SM-B-01Y) BI-RADS CATEGORY  1: Negative. Electronically Signed   By: Marin Olp M.D.   On: 05/29/2016 10:44   Dg Hip Operative Unilat W Or W/o Pelvis Left  Result Date: 06/26/2016 CLINICAL DATA:  Total left hip arthroplasty EXAM: OPERATIVE LEFT HIP (WITH PELVIS IF PERFORMED) 1 VIEWS TECHNIQUE: Fluoroscopic spot image(s) were submitted for interpretation post-operatively. COMPARISON:  Radiography from earlier today FINDINGS: Fluoroscopy shows left femoral neck fracture treated with total hip arthroplasty. No unexpected finding. IMPRESSION: Fluoroscopy for total hip arthroplasty. Electronically Signed   By: Monte Fantasia M.D.   On: 06/26/2016 18:04   Dg Hip Unilat With Pelvis 2-3 Views Left  Result Date: 06/26/2016 CLINICAL DATA:  Recent fall with left hip pain, initial encounter EXAM: DG HIP (WITH OR WITHOUT PELVIS) 2-3V LEFT COMPARISON:  None. FINDINGS: There is a left femoral neck fracture with impaction and angulation at the fracture site. The femoral head remains well seated. The pelvic ring is intact. IMPRESSION: Left femoral neck fracture with impaction Electronically Signed   By: Inez Catalina M.D.   On: 06/26/2016 12:12    Lab Data:  CBC:  Recent Labs Lab 06/26/16 1127 06/27/16 0719  WBC 4.8 12.5*  NEUTROABS 2.5  --   HGB 13.2 10.7*  HCT 39.7 33.1*  MCV 91.3 90.9  PLT 216 409   Basic Metabolic Panel:  Recent Labs Lab 06/26/16 1127 06/27/16 0719  NA 139 137  K 4.2 3.7  CL 105 104  CO2 26 22  GLUCOSE 118* 134*  BUN 8 10  CREATININE 0.65 0.70  CALCIUM 9.4 9.0    GFR: Estimated Creatinine Clearance: 37 mL/min (by C-G formula based on SCr of 0.7 mg/dL). Liver Function Tests:  Recent Labs Lab 06/26/16 1127  AST 29  ALT 21  ALKPHOS 83  BILITOT 0.3  PROT 6.7  ALBUMIN 4.4   No results for input(s): LIPASE, AMYLASE in the last 168 hours. No results for input(s): AMMONIA in the last 168 hours. Coagulation Profile: No results for input(s): INR, PROTIME in the last 168 hours. Cardiac Enzymes: No results for input(s): CKTOTAL, CKMB, CKMBINDEX, TROPONINI in the last 168 hours. BNP (last 3 results) No results for input(s): PROBNP in the last 8760 hours. HbA1C: No results for input(s): HGBA1C in the last 72 hours. CBG: No results for input(s): GLUCAP in the last 168 hours. Lipid Profile: No results for input(s): CHOL, HDL, LDLCALC, TRIG, CHOLHDL, LDLDIRECT in the last 72 hours. Thyroid Function Tests: No results for input(s): TSH, T4TOTAL, FREET4, T3FREE, THYROIDAB in the last 72 hours. Anemia Panel: No results for input(s): VITAMINB12, FOLATE, FERRITIN, TIBC, IRON, RETICCTPCT in the last 72 hours. Urine analysis:    Component Value Date/Time   COLORURINE YELLOW 02/18/2016 1600   APPEARANCEUR CLOUDY (A) 02/18/2016 1600   LABSPEC 1.011 02/18/2016 1600   PHURINE 7.5 02/18/2016 1600   GLUCOSEU NEGATIVE 02/18/2016 1600   HGBUR NEGATIVE 02/18/2016 1600   BILIRUBINUR NEGATIVE 02/18/2016 1600   KETONESUR NEGATIVE 02/18/2016 1600   PROTEINUR NEGATIVE 02/18/2016 1600   UROBILINOGEN 1.0 11/28/2014 1410   NITRITE NEGATIVE 02/18/2016 1600   LEUKOCYTESUR TRACE (A) 02/18/2016 1600     Madysin Crisp M.D. [facility] 06/27/2016, 1:03 PM  Pager: [phone] Between 7am to 7pm - call Pager - [phone]  After 7pm go to [email or web address] - password [email or web address]  Call night coverage person covering after 7pm

## 2016-06-27 NOTE — Evaluation (Signed)
Occupational Therapy Evaluation Patient Details Name: Theresa Crosby MRN: [id number] DOB: [birth year] Today's Date: 06/27/2016    History of Present Illness Pt is 83 yo female with c/o L thigh pain s/p fall on 06/26/16. dx L intertrochanteric fx s/p L THA 06/26/16. PMH significant for CAD/CABG, HTN, L ventricular dysfunction,OA, spondolysithesis, and CA.     Clinical Impression   PTA, pt was living alone and independent. Currently, pt performing ADLs with Min guard for standing balance and Min-Mod A for transfers depending on height of transfer surface. Pt would benefit from continued skilled OT to facilitate safe dc and optimize her occupational performance. Recommend dc to SNF for further OT to increase her safety and independence.    Follow Up Recommendations  SNF;Supervision/Assistance - 24 hour    Equipment Recommendations  Other (comment) (Defer to next venue. Would recommend 3N1 pending pt progress)    Recommendations for Other Services       Precautions / Restrictions Precautions Precautions: Anterior Hip;Fall Precaution Booklet Issued: Yes (comment) Restrictions Weight Bearing Restrictions: Yes LLE Weight Bearing: Weight bearing as tolerated      Mobility Bed Mobility Overal bed mobility: Needs Assistance Bed Mobility: Supine to Sit     Supine to sit: Min assist     General bed mobility comments: In recliner upon arrival  Transfers Overall transfer level: Needs assistance Equipment used: Rolling walker (2 wheeled) Transfers: Sit to/from Stand Sit to Stand: Min assist Stand pivot transfers: Min assist       General transfer comment: Min A to power up from EOB to RW from recliner and other transfers. However, Pt required Mod A for regular height toilet.    Balance Overall balance assessment: Needs assistance Sitting-balance support: Bilateral upper extremity supported;No upper extremity supported Sitting balance-Leahy Scale: Good Sitting balance -  Comments: Able to sit on BSC without support   Standing balance support: Bilateral upper extremity supported;During functional activity Standing balance-Leahy Scale: Fair Standing balance comment: Able to perform groomign at sink without UE support                           ADL either performed or assessed with clinical judgement   ADL Overall ADL's : Needs assistance/impaired Eating/Feeding: Set up;Sitting   Grooming: Oral care;Supervision/safety;Set up;Standing   Upper Body Bathing: Set up;Sitting   Lower Body Bathing: Min guard;Sit to/from stand   Upper Body Dressing : Set up;Sitting   Lower Body Dressing: Min guard;Sit to/from stand Lower Body Dressing Details (indicate cue type and reason): Pt very determined when adjusting socks and donning underwear to complete the task without A. Pt demonstrated good ROM just needed increased time and Min guard for safety in standing Toilet Transfer: Moderate assistance;Ambulation;Regular Toilet;Grab bars;RW Armed forces technical officer Details (indicate cue type and reason): Pt requires Mod A due to pain and height of transfer surface. Feel pt would benefit from 3N1 Toileting- Clothing Manipulation and Hygiene: Min guard;Sit to/from stand       Functional mobility during ADLs: Min guard;Rolling walker General ADL Comments: Pt demonstrating progress in functional performance. Pt would benefit from SNF to increase her safety and independence during ADLs     Vision         Perception     Praxis      Pertinent Vitals/Pain Pain Assessment: Faces Faces Pain Scale: Hurts little more Pain Location: L leg Pain Descriptors / Indicators: Constant;Discomfort;Grimacing Pain Intervention(s): Monitored during session     Hand Dominance  Right   Extremity/Trunk Assessment Upper Extremity Assessment Upper Extremity Assessment: Overall WFL for tasks assessed   Lower Extremity Assessment Lower Extremity Assessment: Defer to PT evaluation;LLE  deficits/detail   Cervical / Trunk Assessment Cervical / Trunk Assessment: Kyphotic   Communication Communication Communication: No difficulties   Cognition Arousal/Alertness: Awake/alert Behavior During Therapy: WFL for tasks assessed/performed Overall Cognitive Status: Within Functional Limits for tasks assessed                                     General Comments  Pt family present for session. Discussed dc recommendation with pt and her son.     Exercises     Shoulder Instructions      Home Living Family/patient expects to be discharged to:: Skilled nursing facility Living Arrangements: Alone   Type of Home: Apartment Home Access: Stairs to enter Entrance Stairs-Number of Steps: 1 Entrance Stairs-Rails: None Home Layout: One level     Bathroom Shower/Tub: Teacher, early years/pre: Standard Bathroom Accessibility: Yes   Home Equipment: Grab bars - tub/shower (suction cup handrails)          Prior Functioning/Environment Level of Independence: Independent        Comments: Pt independent with ADLs, IADLs, driving, and working        OT Problem List: Decreased strength;Decreased range of motion;Decreased activity tolerance;Impaired balance (sitting and/or standing);Decreased safety awareness;Decreased knowledge of use of DME or AE;Decreased knowledge of precautions;Pain      OT Treatment/Interventions: Self-care/ADL training;Therapeutic exercise;Energy conservation;DME and/or AE instruction;Therapeutic activities;Patient/family education    OT Goals(Current goals can be found in the care plan section) Acute Rehab OT Goals Patient Stated Goal: get strong enough to go home OT Goal Formulation: With patient Time For Goal Achievement: 07/11/16 Potential to Achieve Goals: Good ADL Goals Pt Will Perform Upper Body Dressing: with modified independence;sitting Pt Will Perform Lower Body Dressing: with set-up;with supervision;sit to/from  stand Pt Will Transfer to Toilet: with set-up;with supervision;ambulating;bedside commode Pt Will Perform Tub/Shower Transfer: Tub transfer;3 in 1;ambulating;rolling walker;with min guard assist  OT Frequency: Min 2X/week   Barriers to D/C: Decreased caregiver support  Pt lives alone and has decreased caregiver support at home       Co-evaluation              AM-PAC PT "6 Clicks" Daily Activity     Outcome Measure Help from another person eating meals?: None Help from another person taking care of personal grooming?: A Little Help from another person toileting, which includes using toliet, bedpan, or urinal?: A Little Help from another person bathing (including washing, rinsing, drying)?: A Lot Help from another person to put on and taking off regular upper body clothing?: None Help from another person to put on and taking off regular lower body clothing?: A Little 6 Click Score: 19   End of Session Equipment Utilized During Treatment: Gait belt;Rolling walker Nurse Communication: Mobility status  Activity Tolerance: Patient tolerated treatment well Patient left: in chair;with call bell/phone within reach;with family/visitor present  OT Visit Diagnosis: Muscle weakness (generalized) (M62.81);Unsteadiness on feet (R26.81);Pain Pain - Right/Left: Left Pain - part of body: Leg                Time: 2119-4174 OT Time Calculation (min): 32 min Charges:  OT General Charges $OT Visit: 1 Procedure OT Evaluation $OT Eval Low Complexity: 1 Procedure OT Treatments $Self Care/Home  Management : 8-22 mins G-Codes:     [employer], OTR/L [place] 06/27/2016, 4:46 PM

## 2016-06-27 NOTE — Evaluation (Signed)
Physical Therapy Evaluation Patient Details Name: Theresa Crosby MRN: [id number] DOB: 02/02/1933 Today's Date: 06/27/2016   History of Present Illness  Pt is 83 yo female with c/o L thigh pain s/p fall on 06/26/16. dx L intertrochanteric fx s/p L THA 06/26/16. PMH significant for CAD/CABG, HTN, L ventricular dysfunction,OA, spondolysithesis, and CA.    Clinical Impression  Pt is s/p THA resulting in the deficits listed below (see PT Problem List). Pt is currently minA for bed mobility, transfers with RW and ambulation of 3 feet with RW.  Pt will benefit from skilled PT to increase their independence and safety with mobility to allow discharge to the venue listed below.      Follow Up Recommendations SNF;Supervision/Assistance - 24 hour    Equipment Recommendations   (to be determined at next venue)    Recommendations for Other Services       Precautions / Restrictions Precautions Precautions: Anterior Hip;Fall Precaution Booklet Issued: Yes (comment) Restrictions Weight Bearing Restrictions: Yes LLE Weight Bearing: Weight bearing as tolerated      Mobility  Bed Mobility Overal bed mobility: Needs Assistance Bed Mobility: Supine to Sit     Supine to sit: Min assist     General bed mobility comments: minA for LE management to floor and pad scoot of hips to EoB  Transfers Overall transfer level: Needs assistance Equipment used: Rolling walker (2 wheeled) Transfers: Sit to/from Stand Sit to Stand: Min assist         General transfer comment: minA required for power up and steadying in upright  Ambulation/Gait Ambulation/Gait assistance: Min assist Ambulation Distance (Feet): 3 Feet Assistive device: Rolling walker (2 wheeled) Gait Pattern/deviations: Step-to pattern;Decreased stance time - left;Decreased step length - right;Decreased weight shift to left;Shuffle;Trunk flexed;Antalgic Gait velocity: slowed Gait velocity interpretation: Below normal speed for  age/gender General Gait Details: min A for steadying with advancement of R LE, will help to have right sized walker at next session,       Balance Overall balance assessment: Needs assistance Sitting-balance support: Feet unsupported;Bilateral upper extremity supported Sitting balance-Leahy Scale: Fair Sitting balance - Comments: able to sit EoB with single UE support and no LoB Postural control: Right lateral lean Standing balance support: Bilateral upper extremity supported Standing balance-Leahy Scale: Poor Standing balance comment: requires RW and minA for stability                             Pertinent Vitals/Pain Pain Assessment: 0-10 Pain Score: 0-No pain Pain Intervention(s): Monitored during session;Limited activity within patient's tolerance  VSS     Home Living Family/patient expects to be discharged to:: Private residence Living Arrangements: Alone   Type of Home: Apartment Home Access: Stairs to enter Entrance Stairs-Rails: None Entrance Stairs-Number of Steps: 1 Home Layout: One level Home Equipment: Grab bars - tub/shower (suction cup handrails)      Prior Function Level of Independence: Independent         Comments: community ambulator, employed, Tree surgeon        Extremity/Trunk Assessment   Upper Extremity Assessment Upper Extremity Assessment: Overall WFL for tasks assessed    Lower Extremity Assessment Lower Extremity Assessment: RLE deficits/detail;LLE deficits/detail RLE Deficits / Details: ROM WFL, strength grossly 3/5  RLE Sensation: decreased light touch (R foot ) LLE Deficits / Details: decreased ROM and strength secondary to surgical pain and edema LLE: Unable to fully assess due to  pain LLE Sensation: decreased light touch (decreased light touch in L anterior and lateral thigh )    Cervical / Trunk Assessment Cervical / Trunk Assessment: Kyphotic  Communication   Communication: No difficulties   Cognition Arousal/Alertness: Awake/alert Behavior During Therapy: WFL for tasks assessed/performed Overall Cognitive Status: Within Functional Limits for tasks assessed                                           Exercises Total Joint Exercises Ankle Circles/Pumps: AROM;Both;20 reps;Seated Quad Sets: AROM;Left;10 reps;Seated Hip ABduction/ADduction: AROM;Left;10 reps;Seated Long Arc Quad: AROM;5 reps;Seated   Assessment/Plan    PT Assessment Patient needs continued PT services  PT Problem List Decreased strength;Decreased range of motion;Decreased activity tolerance;Decreased balance;Decreased mobility;Decreased knowledge of use of DME;Impaired sensation       PT Treatment Interventions DME instruction;Gait training;Functional mobility training;Stair training;Therapeutic activities;Therapeutic exercise;Balance training;Patient/family education    PT Goals (Current goals can be found in the Care Plan section)  Acute Rehab PT Goals Patient Stated Goal: get strong enough to go home PT Goal Formulation: With patient Time For Goal Achievement: 07/11/16 Potential to Achieve Goals: Good    Frequency 7X/week   Barriers to discharge Decreased caregiver support         AM-PAC PT "6 Clicks" Daily Activity  Outcome Measure Difficulty turning over in bed (including adjusting bedclothes, sheets and blankets)?: Total Difficulty moving from lying on back to sitting on the side of the bed? : Total Difficulty sitting down on and standing up from a chair with arms (e.g., wheelchair, bedside commode, etc,.)?: Total Help needed moving to and from a bed to chair (including a wheelchair)?: A Little Help needed walking in hospital room?: A Lot Help needed climbing 3-5 steps with a railing? : Total 6 Click Score: 9    End of Session Equipment Utilized During Treatment: Gait belt Activity Tolerance: Patient tolerated treatment well Patient left: in chair;with call bell/phone  within reach Nurse Communication: Mobility status;Weight bearing status PT Visit Diagnosis: Unsteadiness on feet (R26.81);Other abnormalities of gait and mobility (R26.89);History of falling (Z91.81);Muscle weakness (generalized) (M62.81);Other symptoms and signs involving the nervous system (R29.898)    Time: 4665-9935 PT Time Calculation (min) (ACUTE ONLY): 31 min   Charges:   PT Evaluation $PT Eval Low Complexity: 1 Procedure PT Treatments $Therapeutic Exercise: 8-22 mins   PT G Codes:        Cali Cuartas B. Migdalia Dk PT, DPT Acute Rehabilitation  [phone] Pager [phone]    [place] 06/27/2016, 11:39 AM

## 2016-06-28 ENCOUNTER — Encounter (HOSPITAL_COMMUNITY): Payer: Self-pay | Admitting: *Deleted

## 2016-06-28 DIAGNOSIS — Z01818 Encounter for other preprocedural examination: Secondary | ICD-10-CM

## 2016-06-28 LAB — CBC
HCT: 32.5 % — ABNORMAL LOW (ref 36.0–46.0)
Hemoglobin: 10.9 g/dL — ABNORMAL LOW (ref 12.0–15.0)
MCH: 30.3 pg (ref 26.0–34.0)
MCHC: 33.5 g/dL (ref 30.0–36.0)
MCV: 90.3 fL (ref 78.0–100.0)
Platelets: 164 10*3/uL (ref 150–400)
RBC: 3.6 MIL/uL — ABNORMAL LOW (ref 3.87–5.11)
RDW: 15.6 % — ABNORMAL HIGH (ref 11.5–15.5)
WBC: 11.5 10*3/uL — ABNORMAL HIGH (ref 4.0–10.5)

## 2016-06-28 LAB — BASIC METABOLIC PANEL
Anion gap: 10 (ref 5–15)
BUN: 14 mg/dL (ref 6–20)
CO2: 24 mmol/L (ref 22–32)
Calcium: 8.9 mg/dL (ref 8.9–10.3)
Chloride: 101 mmol/L (ref 101–111)
Creatinine, Ser: 0.81 mg/dL (ref 0.44–1.00)
GFR calc Af Amer: >60 mL/min (ref >60)
GFR calc non Af Amer: >60 mL/min (ref >60)
Glucose, Bld: 116 mg/dL — ABNORMAL HIGH (ref 65–99)
Potassium: 4.3 mmol/L (ref 3.5–5.1)
Sodium: 135 mmol/L (ref 135–145)

## 2016-06-28 NOTE — NC FL2 (Signed)
MEDICAID FL2 LEVEL OF CARE SCREENING TOOL     IDENTIFICATION  Patient Name: Theresa Crosby Birthdate: 05-03-1932 Sex: female Admission Date (Current Location): 06/26/2016  [facility] and [place] Number:  Herbalist and Address:  [facility], [place] [address], [place], [place] [address]      Provider Number: [id number]  Attending Physician Name and Address:  Mendel Corning, MD  Relative Name and Phone Number:  Dtr [phone]    Current Level of Care: Hospital Recommended Level of Care: [place] Prior Approval Number:    Date Approved/Denied:   PASRR Number: [id number]  Discharge Plan: SNF    Current Diagnoses: Patient Active Problem List   Diagnosis Date Noted  . Left displaced femoral neck fracture ([place]) 06/26/2016  . Hypertension, essential 06/26/2016  . HLD (hyperlipidemia) 06/26/2016  . Coronary heart disease 06/26/2016  . Left ventricular diastolic dysfunction 84/69/6295  . Asthma 06/26/2016  . Left shoulder pain 12/10/2014  . Pain in toe of left foot 02/13/2014  . Pain in lower limb 12/18/2013  . Ingrown nail 11/30/2012  . Pain in joint, ankle and foot 08/30/2012  . Onychomycosis due to dermatophyte 05/30/2012  . Chest pain 11/02/2011  . Oral thrush 11/02/2011  . Neuropathy, lower extremities 10/07/2011  . HTN (hypertension) 10/07/2011  . Dyslipidemia 10/07/2011  . Chest pain at rest associated with dyspnea 10/05/2011  . Abnormal nuclear stress test 10/05/2011  . CAD s/p CABG x 4 2013 10/05/2011  . S/P CABG x 4, LIMA-LAD, SVG-OM2, SVG-diag, SVG-RCA  10/04/11 10/05/2011    Orientation RESPIRATION BLADDER Height & Weight     Self, Time, Situation, Place  Normal Incontinent Weight: 102 lb 6.4 oz (46.4 kg) Height:  4\' 11"  (149.9 cm)  BEHAVIORAL SYMPTOMS/MOOD NEUROLOGICAL BOWEL NUTRITION STATUS      Incontinent Diet (See DC Summary)  AMBULATORY STATUS COMMUNICATION OF NEEDS Skin   Limited  Assist Verbally Normal                       Personal Care Assistance Level of Assistance  Bathing, Dressing Bathing Assistance: Limited assistance   Dressing Assistance: Limited assistance     Functional Limitations Info  Sight, Hearing, Speech Sight Info: Adequate Hearing Info: Adequate Speech Info: Adequate    SPECIAL CARE FACTORS FREQUENCY  PT (By licensed PT), OT (By licensed OT)     PT Frequency: 5x wk OT Frequency: 5x wk            Contractures Contractures Info: Not present    Additional Factors Info  Code Status, Allergies Code Status Info: Full Allergies Info: Aspirin, Lactose Intolerance (Gi)           Current Medications (06/28/2016):  This is the current hospital active medication list Current Facility-Administered Medications  Medication Dose Route Frequency Provider Last Rate Last Dose  . acetaminophen (TYLENOL) tablet 650 mg  650 mg Oral Q6H PRN Leandrew Koyanagi, MD   650 mg at 06/27/16 1335   Or  . acetaminophen (TYLENOL) suppository 650 mg  650 mg Rectal Q6H PRN Leandrew Koyanagi, MD      . albuterol (PROVENTIL) (2.5 MG/3ML) 0.083% nebulizer solution 2.5 mg  2.5 mg Inhalation Q6H PRN Samella Parr, NP      . alum & mag hydroxide-simeth (MAALOX/MYLANTA) 200-200-20 MG/5ML suspension 30 mL  30 mL Oral Q4H PRN Leandrew Koyanagi, MD      . amLODipine (  NORVASC) tablet 5 mg  5 mg Oral Daily Samella Parr, NP   5 mg at 06/28/16 1015  . aspirin EC tablet 81 mg  81 mg Oral Daily Samella Parr, NP   81 mg at 06/28/16 1015  . cholecalciferol (VITAMIN D) tablet 2,000 Units  2,000 Units Oral Minus Liberty, NP   2,000 Units at 06/28/16 0826  . dorzolamide-timolol (COSOPT) 22.3-6.8 MG/ML ophthalmic solution 1 drop  1 drop Both Eyes BID Samella Parr, NP   1 drop at 06/28/16 0557  . enoxaparin (LOVENOX) injection 40 mg  40 mg Subcutaneous Q24H Leandrew Koyanagi, MD   40 mg at 06/27/16 2053  . fluticasone (FLONASE) 50 MCG/ACT nasal spray 2 spray  2 spray  Each Nare Daily Samella Parr, NP      . HYDROcodone-acetaminophen (NORCO/VICODIN) 5-325 MG per tablet 1-2 tablet  1-2 tablet Oral Q6H PRN Leandrew Koyanagi, MD   2 tablet at 06/28/16 0600  . lactated ringers infusion   Intravenous Continuous Lillia Abed, MD      . latanoprost (XALATAN) 0.005 % ophthalmic solution 1 drop  1 drop Both Eyes QHS Leandrew Koyanagi, MD   1 drop at 06/27/16 2110  . loratadine (CLARITIN) tablet 10 mg  10 mg Oral Daily PRN Samella Parr, NP      . magnesium oxide (MAG-OX) tablet 400 mg  400 mg Oral Daily Samella Parr, NP   400 mg at 06/28/16 0556  . menthol-cetylpyridinium (CEPACOL) lozenge 3 mg  1 lozenge Oral PRN Leandrew Koyanagi, MD       Or  . phenol (CHLORASEPTIC) mouth spray 1 spray  1 spray Mouth/Throat PRN Leandrew Koyanagi, MD      . methocarbamol (ROBAXIN) tablet 500 mg  500 mg Oral Q6H PRN Leandrew Koyanagi, MD       Or  . methocarbamol (ROBAXIN) 500 mg in dextrose 5 % 50 mL IVPB  500 mg Intravenous Q6H PRN Leandrew Koyanagi, MD      . metoCLOPramide (REGLAN) tablet 5-10 mg  5-10 mg Oral Q8H PRN Leandrew Koyanagi, MD       Or  . metoCLOPramide (REGLAN) injection 5-10 mg  5-10 mg Intravenous Q8H PRN Leandrew Koyanagi, MD      . metoprolol tartrate (LOPRESSOR) tablet 25 mg  25 mg Oral BID Samella Parr, NP   25 mg at 06/28/16 1015  . mometasone-formoterol (DULERA) 100-5 MCG/ACT inhaler 2 puff  2 puff Inhalation BID Samella Parr, NP   2 puff at 06/27/16 2155  . morphine 4 MG/ML injection 0.52 mg  0.52 mg Intravenous Q2H PRN Leandrew Koyanagi, MD      . ondansetron [facility]) tablet 4 mg  4 mg Oral Q6H PRN Leandrew Koyanagi, MD   4 mg at 06/28/16 4098   Or  . ondansetron (ZOFRAN) injection 4 mg  4 mg Intravenous Q6H PRN Leandrew Koyanagi, MD      . oxyCODONE (Oxy IR/ROXICODONE) immediate release tablet 5-10 mg  5-10 mg Oral Q4H PRN Leandrew Koyanagi, MD      . pantoprazole (PROTONIX) EC tablet 40 mg  40 mg Oral Daily Samella Parr, NP   40 mg at 06/28/16 1015  . polyethylene glycol  (MIRALAX / GLYCOLAX) packet 17 g  17 g Oral Daily Rai, Ripudeep K, MD   17 g at 06/28/16 1015  . rosuvastatin (CRESTOR) tablet 10 mg  10  mg Oral Daily Samella Parr, NP   10 mg at 06/28/16 1015  . senna-docusate (Senokot-S) tablet 1 tablet  1 tablet Oral BID Rai, Ripudeep K, MD   1 tablet at 06/28/16 1015  . tranexamic acid (CYKLOKAPRON) 1,000 mg in sodium chloride 0.9 % 100 mL IVPB  1,000 mg Intravenous Once Leandrew Koyanagi, MD         Discharge Medications: Please see discharge summary for a list of discharge medications.  Relevant Imaging Results:  Relevant Lab Results:   Additional Information [place], Valori Hollenkamp B, LCSWA

## 2016-06-28 NOTE — Clinical Social Work Placement (Signed)
   CLINICAL SOCIAL WORK PLACEMENT  NOTE  Date:  06/28/2016  Patient Details  Name: Theresa Crosby MRN: [id number] Date of Birth: [birth year]  Clinical Social Work is seeking post-discharge placement for this patient at the [place] level of care (*CSW will initial, date and re-position this form in  chart as items are completed):  Yes   Patient/family provided with [place] Work Department's list of facilities offering this level of care within the geographic area requested by the patient (or if unable, by the patient's family).  Yes   Patient/family informed of their freedom to choose among providers that offer the needed level of care, that participate in Medicare, Medicaid or managed care program needed by the patient, have an available bed and are willing to accept the patient.  Yes   Patient/family informed of [facility]'s ownership interest in [facility] and [facility], as well as of the fact that they are under no obligation to receive care at these facilities.  PASRR submitted to EDS on       PASRR number received on       Existing PASRR number confirmed on 06/28/16     FL2 transmitted to all facilities in geographic area requested by pt/family on 06/28/16     FL2 transmitted to all facilities within larger geographic area on       Patient informed that his/her managed care company has contracts with or will negotiate with certain facilities, including the following:        Yes   Patient/family informed of bed offers received.  Patient chooses bed at       Physician recommends and patient chooses bed at      Patient to be transferred to   on  .  Patient to be transferred to facility by       Patient family notified on   of transfer.  Name of family member notified:        PHYSICIAN       Additional Comment:    _______________________________________________ Serafina Mitchell, [place] 06/28/2016, 11:52 AM

## 2016-06-28 NOTE — Clinical Social Work Note (Addendum)
CSW received call, pt ready to DC to SNF, Pt has [facility] and will require auth, no auth possible on Sunday. Pt's SNF W/U complete, awaiting bed offers.  CSW will continue to follow.  Albertha Beattie B. Joline Maxcy Clinical Social Work Dept Weekend Social Worker [phone] 11:53 AM

## 2016-06-28 NOTE — Progress Notes (Signed)
Physical Therapy Treatment Patient Details Name: Theresa Crosby MRN: [id number] DOB: 02/02/1933 Today's Date: 06/28/2016    History of Present Illness Pt is 83 yo female with c/o L thigh pain s/p fall on 06/26/16. dx L intertrochanteric fx s/p L THA 06/26/16. PMH significant for CAD/CABG, HTN, L ventricular dysfunction,OA, spondolysithesis, and CA.      PT Comments    Patient continues to make progress toward mobility goals. Able to ambulate farther with less assist and less difficulty with L hip flexion today. Continue to progress as tolerated with anticipated d/c to SNF for further skilled PT services.      Follow Up Recommendations  SNF;Supervision/Assistance - 24 hour     Equipment Recommendations  Rolling walker with 5" wheels;Other (comment) (youth, defer to next venue)    Recommendations for Other Services       Precautions / Restrictions Precautions Precautions: Anterior Hip;Fall Precaution Comments: reviewed precautions with pt Restrictions Weight Bearing Restrictions: Yes LLE Weight Bearing: Weight bearing as tolerated    Mobility  Bed Mobility               General bed mobility comments: pt OOB in chair upon arrival  Transfers Overall transfer level: Needs assistance Equipment used: Rolling walker (2 wheeled) Transfers: Sit to/from Stand Sit to Stand: Min assist         General transfer comment: assist to power up into standing; cues for safe hand placement  Ambulation/Gait Ambulation/Gait assistance: Min guard Ambulation Distance (Feet): 50 Feet Assistive device: Rolling walker (2 wheeled) Gait Pattern/deviations: Step-to pattern;Decreased stance time - left;Decreased step length - right;Decreased weight shift to left;Trunk flexed;Antalgic;Decreased dorsiflexion - left Gait velocity: decreased   General Gait Details: cues for sequencing and cadence; heavy reliance on RW; pt with improving step through pattern with increased distance; cues for  precautions when turning and stepping backwards   Stairs            Wheelchair Mobility    Modified Rankin (Stroke Patients Only)       Balance Overall balance assessment: Needs assistance Sitting-balance support: No upper extremity supported Sitting balance-Leahy Scale: Good     Standing balance support: Bilateral upper extremity supported;During functional activity Standing balance-Leahy Scale: Poor Standing balance comment: relies on RW for stability in standing                            Cognition Arousal/Alertness: Awake/alert Behavior During Therapy: WFL for tasks assessed/performed Overall Cognitive Status: Within Functional Limits for tasks assessed                                        Exercises Total Joint Exercises Towel Squeeze: AROM;10 reps Heel Slides: AROM;Left;10 reps Long Arc Quad: AROM;Left;10 reps    General Comments        Pertinent Vitals/Pain Pain Assessment: Faces Faces Pain Scale: Hurts little more Pain Location: L thigh  Pain Descriptors / Indicators: Burning Pain Intervention(s): Limited activity within patient's tolerance;Monitored during session;Premedicated before session;Repositioned    Home Living                      Prior Function            PT Goals (current goals can now be found in the care plan section) Progress towards PT goals: Progressing toward goals  Frequency    Min 5X/week      PT Plan Frequency needs to be updated    Co-evaluation              AM-PAC PT "6 Clicks" Daily Activity  Outcome Measure  Difficulty turning over in bed (including adjusting bedclothes, sheets and blankets)?: Total Difficulty moving from lying on back to sitting on the side of the bed? : Total Difficulty sitting down on and standing up from a chair with arms (e.g., wheelchair, bedside commode, etc,.)?: A Little Help needed moving to and from a bed to chair (including a  wheelchair)?: A Little Help needed walking in hospital room?: A Little Help needed climbing 3-5 steps with a railing? : A Lot 6 Click Score: 13    End of Session Equipment Utilized During Treatment: Gait belt Activity Tolerance: Patient tolerated treatment well Patient left: in chair;with call bell/phone within reach Nurse Communication: Mobility status PT Visit Diagnosis: Unsteadiness on feet (R26.81);Other abnormalities of gait and mobility (R26.89);History of falling (Z91.81);Muscle weakness (generalized) (M62.81);Other symptoms and signs involving the nervous system (R29.898)     Time: 3524-8185 PT Time Calculation (min) (ACUTE ONLY): 32 min  Charges:  $Gait Training: 8-22 mins $Therapeutic Exercise: 8-22 mins                    G Codes:       Earney Navy, PTA Pager: [phone]     Darliss Cheney 06/28/2016, 9:32 AM

## 2016-06-28 NOTE — Progress Notes (Signed)
Patient's pain is improved and in good spirits.  She's very alert and motivated to get well.  She's progressing with PT.  Stable from my stand point to dc SNF when bed available.

## 2016-06-28 NOTE — Progress Notes (Signed)
[facility]                                                                              Patient Demographics  Theresa Crosby, is a 83 y.o. female, DOB - [birth year], GEZ:[id number]  Admit date - 06/26/2016   Admitting Physician Elwin Mocha, MD  Outpatient Primary MD for the patient is System, Pcp Not In  Outpatient specialists:   LOS - 2  days    Chief Complaint  Patient presents with  . Fall       Brief summary   Theresa Crosby is a 83 y.o. female with medical history significant for CAD with prior CABG procedure in 2013 by Dr. Cyndia Bent, hypertension, dyslipidemia, anxiety disorder and asthma-like syndrome. Patient tripped over a chair and fell. She did not express an loss of consciousness. She has since experienced significant thigh pain and x-ray in the ER reveals a left femoral neck fracture that is impacted. Orthopedics was consulted, patient underwent total hip replacement on 5/11.   Assessment & Plan    Principal Problem:   Left displaced femoral neck fracture (HCC) - Status post mechanical fall, presented with leg pain, x-ray showed femoral neck fracture - Orthopedics was consulted, patient underwent total hip replacement on 5/11, postop day # 2 - Continue pain control, physical therapy - Patient is stable, awaiting skilled nursing facility  Active Problems:   Hypertension, essential - Currently stable, continue Norvasc, Lopressor    HLD (hyperlipidemia) - Continue statin    Coronary heart disease - No complaints, asymptomatic, continue aspirin, statin, beta blocker - Stress test in 2016 low risk study    Asthma -Currently stable, no wheezing  Code Status: Full CODE STATUS DVT Prophylaxis:  Lovenox  Family Communication: Discussed in detail with the patient, all imaging results, lab results explained to the patient    Disposition Plan: Likely skilled nursing facility on Monday   Time Spent in minutes   15 minutes  Procedures:    Operative Procedures  1. Total hip replacement; Left hip  Consultants:   Orthopedics   Antimicrobials:      Medications  Scheduled Meds: . amLODipine  5 mg Oral Daily  . aspirin EC  81 mg Oral Daily  . cholecalciferol  2,000 Units Oral BH-q7a  . dorzolamide-timolol  1 drop Both Eyes BID  . enoxaparin (LOVENOX) injection  40 mg Subcutaneous Q24H  . fluticasone  2 spray Each Nare Daily  . latanoprost  1 drop Both Eyes QHS  . magnesium oxide  400 mg Oral Daily  . metoprolol tartrate  25 mg Oral BID  . mometasone-formoterol  2 puff Inhalation BID  . pantoprazole  40 mg Oral Daily  . polyethylene glycol  17 g Oral Daily  . rosuvastatin  10 mg Oral Daily  . senna-docusate  1 tablet Oral BID   Continuous Infusions: . lactated ringers    . methocarbamol (ROBAXIN)  IV    . tranexamic acid     PRN Meds:.acetaminophen **OR** acetaminophen, albuterol, alum & mag hydroxide-simeth, HYDROcodone-acetaminophen, loratadine, menthol-cetylpyridinium **OR** phenol, methocarbamol **OR** methocarbamol (ROBAXIN)  IV, metoCLOPramide **OR** metoCLOPramide (REGLAN) injection, morphine injection,  ondansetron **OR** ondansetron (ZOFRAN) IV, oxyCODONE   Antibiotics   Anti-infectives    Start     Dose/Rate Route Frequency Ordered Stop   06/27/16 0600  ceFAZolin (ANCEF) IVPB 2g/100 mL premix  Status:  Discontinued     2 g 200 mL/hr over 30 Minutes Intravenous On call to O.R. 06/26/16 1600 06/26/16 1609   06/26/16 2130  ceFAZolin (ANCEF) IVPB 2g/100 mL premix     2 g 200 mL/hr over 30 Minutes Intravenous Every 6 hours 06/26/16 2119 06/27/16 0815   06/26/16 1545  ceFAZolin (ANCEF) IVPB 2g/100 mL premix     2 g 200 mL/hr over 30 Minutes Intravenous To [facility] 06/26/16 1525 06/26/16 1640   06/26/16 1532  ceFAZolin (ANCEF) 2-4 GM/100ML-% IVPB    Comments:  Schonewitz, Leigh   : cabinet override      06/26/16 1532 06/26/16 1640        Subjective:   Theresa Crosby was seen and  examined today. No complaints, pleasant, sitting in the chair. Pain controlled. Patient denies dizziness, chest pain, shortness of breath, abdominal pain, N/V/D/C, new weakness, numbess, tingling. No acute events overnight.    Objective:   Vitals:   06/27/16 1700 06/27/16 2038 06/27/16 2157 06/28/16 0605  BP: 131/69 132/70  (!) 141/71  Pulse: 96 97  (!) 104  Resp: 18 18  18   Temp: 99.8 F (37.7 C) 99 F (37.2 C)  99.8 F (37.7 C)  TempSrc: Oral Oral  Oral  SpO2: 99% 100% 100% 96%  Weight:      Height:        Intake/Output Summary (Last 24 hours) at 06/28/16 1331 Last data filed at 06/27/16 1700  Gross per 24 hour  Intake              420 ml  Output                0 ml  Net              420 ml     Wt Readings from Last 3 Encounters:  06/26/16 46.4 kg (102 lb 6.4 oz)  02/18/16 47.2 kg (104 lb)  12/10/15 45.4 kg (100 lb)     Exam  General: Alert and oriented x 3, NAD, Pain controlled sitting up in the chair.  HEENT:    Neck: Supple, no JVD, no masses  Cardiovascular: S1 and S2 clear, RRR  Respiratory: CTAB, no wheezing or rhonchi  Gastrointestinal: Soft, NT, NBS, ND.  Ext: no cyanosis, clubbing, edema bilaterally  Neuro: No new deficits   Skin: No rashes  Psych: Normal affect and demeanor, alert and oriented x3    Data Reviewed:  I have personally reviewed following labs and imaging studies  Micro Results Recent Results (from the past 240 hour(s))  Surgical pcr screen     Status: None   Collection Time: 06/26/16  4:00 PM  Result Value Ref Range Status   MRSA, PCR NEGATIVE NEGATIVE Final   Staphylococcus aureus NEGATIVE NEGATIVE Final    Comment:        The Xpert SA Assay (FDA approved for NASAL specimens in patients over 3 years of age), is one component of a comprehensive surveillance program.  Test performance has been validated by [facility] for patients greater than or equal to 68 year old. It is not intended to diagnose infection nor  to guide or monitor treatment.     Radiology Reports Pelvis Portable  Result Date: 06/26/2016 CLINICAL  DATA:  Status post left hip arthroplasty. Initial encounter. EXAM: PORTABLE PELVIS 1-2 VIEWS COMPARISON:  CT of the abdomen and pelvis from 02/18/2016 FINDINGS: The patient's left hip arthroplasty is grossly unremarkable in appearance, without evidence of loosening or new fracture. The right hip joint is grossly unremarkable in appearance. Mild degenerative change is noted at the lower lumbar spine. The sacroiliac joints are grossly unremarkable. The visualized bowel gas pattern is within normal limits. Scattered soft tissue air is seen tracking about the left hip and left side of the pelvis. Overlying skin staples are seen. IMPRESSION: 1. Left hip arthroplasty is grossly unremarkable in appearance, without evidence of loosening or new fracture. 2. Scattered postoperative soft tissue air about the left hip and left side of the pelvis. Electronically Signed   By: Garald Balding M.D.   On: 06/26/2016 20:02   Dg Chest Port 1 View  Result Date: 06/26/2016 CLINICAL DATA:  Preoperative examination. EXAM: PORTABLE CHEST 1 VIEW COMPARISON:  02/24/2016 FINDINGS: Prior median sternotomy. Both lungs are clear. Heart size is normal. The trachea is midline. Negative for a pneumothorax. Bony thorax is intact. IMPRESSION: No active disease. Electronically Signed   By: Markus Daft M.D.   On: 06/26/2016 14:00   Dg C-arm 61-120 Min-no Report  Result Date: 06/26/2016 Fluoroscopy was utilized by the requesting physician.  No radiographic interpretation.   Dg Hip Operative Unilat W Or W/o Pelvis Left  Result Date: 06/26/2016 CLINICAL DATA:  Total left hip arthroplasty EXAM: OPERATIVE LEFT HIP (WITH PELVIS IF PERFORMED) 1 VIEWS TECHNIQUE: Fluoroscopic spot image(s) were submitted for interpretation post-operatively. COMPARISON:  Radiography from earlier today FINDINGS: Fluoroscopy shows left femoral neck fracture  treated with total hip arthroplasty. No unexpected finding. IMPRESSION: Fluoroscopy for total hip arthroplasty. Electronically Signed   By: Monte Fantasia M.D.   On: 06/26/2016 18:04   Dg Hip Unilat With Pelvis 2-3 Views Left  Result Date: 06/26/2016 CLINICAL DATA:  Recent fall with left hip pain, initial encounter EXAM: DG HIP (WITH OR WITHOUT PELVIS) 2-3V LEFT COMPARISON:  None. FINDINGS: There is a left femoral neck fracture with impaction and angulation at the fracture site. The femoral head remains well seated. The pelvic ring is intact. IMPRESSION: Left femoral neck fracture with impaction Electronically Signed   By: Inez Catalina M.D.   On: 06/26/2016 12:12    Lab Data:  CBC:  Recent Labs Lab 06/26/16 1127 06/27/16 0719 06/28/16 0633  WBC 4.8 12.5* 11.5*  NEUTROABS 2.5  --   --   HGB 13.2 10.7* 10.9*  HCT 39.7 33.1* 32.5*  MCV 91.3 90.9 90.3  PLT 216 179 564   Basic Metabolic Panel:  Recent Labs Lab 06/26/16 1127 06/27/16 0719 06/28/16 0633  NA 139 137 135  K 4.2 3.7 4.3  CL 105 104 101  CO2 26 22 24   GLUCOSE 118* 134* 116*  BUN 8 10 14   CREATININE 0.65 0.70 0.81  CALCIUM 9.4 9.0 8.9   GFR: Estimated Creatinine Clearance: 36.5 mL/min (by C-G formula based on SCr of 0.81 mg/dL). Liver Function Tests:  Recent Labs Lab 06/26/16 1127  AST 29  ALT 21  ALKPHOS 83  BILITOT 0.3  PROT 6.7  ALBUMIN 4.4   No results for input(s): LIPASE, AMYLASE in the last 168 hours. No results for input(s): AMMONIA in the last 168 hours. Coagulation Profile: No results for input(s): INR, PROTIME in the last 168 hours. Cardiac Enzymes: No results for input(s): CKTOTAL, CKMB, CKMBINDEX, TROPONINI in the last  168 hours. BNP (last 3 results) No results for input(s): PROBNP in the last 8760 hours. HbA1C: No results for input(s): HGBA1C in the last 72 hours. CBG: No results for input(s): GLUCAP in the last 168 hours. Lipid Profile: No results for input(s): CHOL, HDL, LDLCALC,  TRIG, CHOLHDL, LDLDIRECT in the last 72 hours. Thyroid Function Tests: No results for input(s): TSH, T4TOTAL, FREET4, T3FREE, THYROIDAB in the last 72 hours. Anemia Panel: No results for input(s): VITAMINB12, FOLATE, FERRITIN, TIBC, IRON, RETICCTPCT in the last 72 hours. Urine analysis:    Component Value Date/Time   COLORURINE YELLOW 02/18/2016 1600   APPEARANCEUR CLOUDY (A) 02/18/2016 1600   LABSPEC 1.011 02/18/2016 1600   PHURINE 7.5 02/18/2016 1600   GLUCOSEU NEGATIVE 02/18/2016 1600   HGBUR NEGATIVE 02/18/2016 1600   BILIRUBINUR NEGATIVE 02/18/2016 1600   KETONESUR NEGATIVE 02/18/2016 1600   PROTEINUR NEGATIVE 02/18/2016 1600   UROBILINOGEN 1.0 11/28/2014 1410   NITRITE NEGATIVE 02/18/2016 1600   LEUKOCYTESUR TRACE (A) 02/18/2016 1600     Theresa Crosby M.D. [facility] 06/28/2016, 1:31 PM  Pager: [phone] Between 7am to 7pm - call Pager - [phone]  After 7pm go to [email or web address] - password [email or web address]  Call night coverage person covering after 7pm

## 2016-06-29 ENCOUNTER — Encounter (HOSPITAL_COMMUNITY): Payer: Self-pay | Admitting: Orthopaedic Surgery

## 2016-06-29 DIAGNOSIS — W19XXXA Unspecified fall, initial encounter: Secondary | ICD-10-CM

## 2016-06-29 LAB — CBC
HCT: 29.1 % — ABNORMAL LOW (ref 36.0–46.0)
Hemoglobin: 9.8 g/dL — ABNORMAL LOW (ref 12.0–15.0)
MCH: 30.4 pg (ref 26.0–34.0)
MCHC: 33.7 g/dL (ref 30.0–36.0)
MCV: 90.4 fL (ref 78.0–100.0)
Platelets: 141 10*3/uL — ABNORMAL LOW (ref 150–400)
RBC: 3.22 MIL/uL — ABNORMAL LOW (ref 3.87–5.11)
RDW: 15.7 % — ABNORMAL HIGH (ref 11.5–15.5)
WBC: 9.8 10*3/uL (ref 4.0–10.5)

## 2016-06-29 MED ORDER — BISACODYL 10 MG RE SUPP
10.0000 mg | Freq: Once | RECTAL | Status: AC
  Administered 2016-06-29: 10 mg via RECTAL
  Filled 2016-06-29: qty 1

## 2016-06-29 MED ORDER — SENNOSIDES-DOCUSATE SODIUM 8.6-50 MG PO TABS: 1.0000 | ORAL_TABLET | Freq: Two times a day (BID) | ORAL | Status: DC

## 2016-06-29 MED ORDER — MAGNESIUM CITRATE PO SOLN
1.0000 | Freq: Once | ORAL | Status: AC
  Administered 2016-06-29: 1 via ORAL
  Filled 2016-06-29: qty 296

## 2016-06-29 NOTE — Clinical Social Work Note (Addendum)
Clinical Social Work Assessment  Patient Details  Name: Theresa Crosby MRN: [id number] Date of Birth: Jan 29, 1933  Date of referral:  06/29/16               Reason for consult:  Facility Placement                Permission sought to share information with:  Facility Art therapist granted to share information::  Yes, Verbal Permission Granted  Name::        Agency::  SNF  Relationship::     Contact Information:     Housing/Transportation Living arrangements for the past 2 months:  Single Family Home Source of Information:  Patient Patient Interpreter Needed:  None Criminal Activity/Legal Involvement Pertinent to Current Situation/Hospitalization:  No - Comment as needed Significant Relationships:  Adult Children, Other Family Members Lives with:  Self Do you feel safe going back to the place where you live?  No Need for family participation in patient care:  Yes (Comment)  Care giving concerns:  Patient resides alone and independently managed her ADL's prior to hospitalization.  Her family resides out of state and she is not safe to return home at this time.    Social Worker assessment / plan:  CSW met with patient this morning to discuss recommendations for Dc to SNF. CSW explained her role and SNF placement and options.  Patient indicated that she wanted to go to [facility].  CSW f/u with [facility] and they indicated that they are out of network and patient would be responsible for $128 out of pocket for semi private room and $164 out of pocket for private room daily.  Patient indicated that she would need to speak with daughter who just left this morning to return to [place] via airplane.  CSW f/u and patient unable to reach daughter as she was probably in the plane. CSW contacted daughter and left msg,.  CSW received return call from daughter who indicated that they cannot afford the out of pocket expense daily. CSW indicated that patient selected a second SNF choice of  [place]. CSW f/u with [facility] to confirm that they had no up front out of pocket fees. Staff at facility confirmed that they are in network with insurance and that they could begin the [employer]. Permission given to begin [employer].  Employment status:  Retired Nurse, adult PT Recommendations:  [place] / Referral to community resources:  [place]  Patient/Family's Response to care:  Patient and family appreciative of CSW assistance with DC to SNF. Patient understanding that her first SNF choice was out of network and was agreable with second SNF placement. No issues reported at this time.  Patient/Family's Understanding of and Emotional Response to Diagnosis, Current Treatment, and Prognosis:  Patient and family has good understanding of diagnosis, current treatment and prognosis and are hopeful that skills needs will address her impairment. No issues or concerns reported at this time.  Emotional Assessment Appearance:  Appears stated age Attitude/Demeanor/Rapport:   (Cooperative) Affect (typically observed):  Accepting, Appropriate Orientation:  Oriented to Self, Oriented to Place, Oriented to  Time, Oriented to Situation Alcohol / Substance use:  Not Applicable Psych involvement (Current and /or in the community):  No (Comment)  Discharge Needs  Concerns to be addressed:  Care Coordination Readmission within the last 30 days:    Current discharge risk:  Physical Impairment, Dependent with Mobility Barriers to Discharge:  No Barriers Identified   Theresa Baxter, LCSW 06/29/2016, 2:03 PM

## 2016-06-29 NOTE — Social Work (Signed)
CSW contacted facility and they advised that Theresa Crosby is still pending. CSW will f/u. Looking to DC tomorrow to [facility].

## 2016-06-29 NOTE — Progress Notes (Signed)
Physical Therapy Treatment Patient Details Name: Theresa Crosby MRN: [id number] DOB: [birth year] Today's Date: 06/29/2016    History of Present Illness Pt is 83 yo female with c/o L thigh pain s/p fall on 06/26/16. dx L intertrochanteric fx s/p L THA 06/26/16. PMH significant for CAD/CABG, HTN, L ventricular dysfunction,OA, spondolysithesis, and CA.      PT Comments    Pt is making good progress towards her goal. Pt is currently, minA x1 for transfers, and min guard for slow ambulation of 65 feet with RW. Pr requires skilled PT to continue to progress transfer and gait training and to improve LE strength, ROM and endurance to safely mobilize in discharge environment.    Follow Up Recommendations  SNF;Supervision/Assistance - 24 hour     Equipment Recommendations  Rolling walker with 5" wheels;Other (comment) (youth, defer to next venue)    Recommendations for Other Services       Precautions / Restrictions Precautions Precautions: Anterior Hip;Fall Precaution Comments: reviewed precautions with pt Restrictions Weight Bearing Restrictions: Yes LLE Weight Bearing: Weight bearing as tolerated    Mobility  Bed Mobility               General bed mobility comments: pt OOB in chair upon arrival  Transfers Overall transfer level: Needs assistance Equipment used: Rolling walker (2 wheeled) Transfers: Sit to/from Stand Sit to Stand: Min assist         General transfer comment: assist to power up into standing; vc for anterior pelvic tilt and upright posture  Ambulation/Gait Ambulation/Gait assistance: Min guard Ambulation Distance (Feet): 65 Feet Assistive device: Rolling walker (2 wheeled) Gait Pattern/deviations: Step-to pattern;Decreased stance time - left;Decreased step length - right;Decreased weight shift to left;Trunk flexed;Antalgic;Decreased dorsiflexion - left Gait velocity: decreased Gait velocity interpretation: Below normal speed for age/gender General  Gait Details: vc for sequencing, pt with steady gait but cadence is very decreased.      Balance Overall balance assessment: Needs assistance Sitting-balance support: No upper extremity supported Sitting balance-Leahy Scale: Good     Standing balance support: Bilateral upper extremity supported;During functional activity Standing balance-Leahy Scale: Poor Standing balance comment: relies on RW for stability in standing                            Cognition Arousal/Alertness: Awake/alert Behavior During Therapy: WFL for tasks assessed/performed Overall Cognitive Status: Within Functional Limits for tasks assessed                                               Pertinent Vitals/Pain Pain Assessment: 0-10 Pain Score: 1  Pain Location: L thigh  Pain Descriptors / Indicators: Burning  VSS           PT Goals (current goals can now be found in the care plan section) Acute Rehab PT Goals PT Goal Formulation: With patient Time For Goal Achievement: 07/11/16 Potential to Achieve Goals: Good Progress towards PT goals: Progressing toward goals    Frequency    Min 5X/week      PT Plan Frequency needs to be updated       AM-PAC PT "6 Clicks" Daily Activity  Outcome Measure  Difficulty turning over in bed (including adjusting bedclothes, sheets and blankets)?: Total Difficulty moving from lying on back to sitting on the side of the bed? :  Total Difficulty sitting down on and standing up from a chair with arms (e.g., wheelchair, bedside commode, etc,.)?: A Little Help needed moving to and from a bed to chair (including a wheelchair)?: A Little Help needed walking in hospital room?: A Little Help needed climbing 3-5 steps with a railing? : A Lot 6 Click Score: 13    End of Session Equipment Utilized During Treatment: Gait belt Activity Tolerance: Patient tolerated treatment well Patient left: in chair;with call bell/phone within reach Nurse  Communication: Mobility status PT Visit Diagnosis: Unsteadiness on feet (R26.81);Other abnormalities of gait and mobility (R26.89);History of falling (Z91.81);Muscle weakness (generalized) (M62.81);Other symptoms and signs involving the nervous system (M40.375)     Time: 4360-6770 PT Time Calculation (min) (ACUTE ONLY): 34 min  Charges:  $Gait Training: 8-22 mins $Therapeutic Activity: 8-22 mins                    G Codes:       Ascencion Coye B. Migdalia Dk PT, DPT Acute Rehabilitation  [phone] [phone]     Martha 06/29/2016, 3:16 PM

## 2016-06-29 NOTE — Discharge Summary (Signed)
Physician Discharge Summary   Patient ID: Theresa Crosby MRN: [id number] DOB/AGE: 09/15/1932 83 y.o.  Admit date: 06/26/2016 Discharge date: 06/29/2016  Primary Care Physician:  System, Pcp Not In  Discharge Diagnoses:    . Left displaced femoral neck fracture ([place]) . Hypertension, essential . HLD (hyperlipidemia) . Coronary heart disease . Left ventricular diastolic dysfunction . Asthma   Consults:Orthopedics, Dr Erlinda Hong  Recommendations for Outpatient Follow-up:  1. Please repeat CBC/BMET at next visit 2. Orthopedics instruction Return in 2 weeks for staple removal. Weight Bearing/Load Lower Extremity: full  Hip precautions: none Suture Removal: 10-14 days  Betadine to incision twice daily once dressing is removed on POD#7  DIET: Heart healthy diet    Allergies:   Allergies  Allergen Reactions  . Aspirin Other (See Comments)    GI upset,high doses  . Lactose Intolerance (Gi) Other (See Comments)    unspecified     DISCHARGE MEDICATIONS: Current Discharge Medication List    START taking these medications   Details  enoxaparin (LOVENOX) 40 MG/0.4ML injection Inject 0.4 mLs (40 mg total) into the skin daily. Qty: 14 Syringe, Refills: 0    oxyCODONE (OXY IR/ROXICODONE) 5 MG immediate release tablet Take 1-3 tablets (5-15 mg total) by mouth every 4 (four) hours as needed. Qty: 90 tablet, Refills: 0    senna-docusate (SENOKOT-S) 8.6-50 MG tablet Take 1 tablet by mouth 2 (two) times daily.      CONTINUE these medications which have NOT CHANGED   Details  ADVAIR DISKUS 100-50 MCG/DOSE AEPB Inhale 1 puff into the lungs every 12 (twelve) hours as needed for wheezing. Refills: 2    albuterol (PROVENTIL HFA;VENTOLIN HFA) 108 (90 BASE) MCG/ACT inhaler Inhale 1-2 puffs into the lungs every 6 (six) hours as needed for wheezing or shortness of breath.    amLODipine (NORVASC) 5 MG tablet Take 5 mg by mouth daily.      aspirin EC 81 MG tablet Take 1 tablet (81 mg  total) by mouth daily. Qty: 100 tablet, Refills: 0    Cholecalciferol (VITAMIN D3) 1000 UNITS CAPS Take 2,000 Units by mouth every morning.      dorzolamide-timolol (COSOPT) 22.3-6.8 MG/ML ophthalmic solution Place 1 drop into both eyes 2 (two) times daily. Refills: 5    fluticasone (FLONASE) 50 MCG/ACT nasal spray Place 2 sprays into the nose as needed for rhinitis.    latanoprost (XALATAN) 0.005 % ophthalmic solution Place 1 drop into both eyes at bedtime.      loratadine (CLARITIN) 10 MG tablet Take 10 mg by mouth daily as needed for allergies.    Magnesium Oxide 500 MG TABS Take 500 mg by mouth daily.    metoprolol tartrate (LOPRESSOR) 25 MG tablet Take 25 mg by mouth 2 (two) times daily.     nitroGLYCERIN (NITROSTAT) 0.4 MG SL tablet Place 0.4 mg under the tongue every 5 (five) minutes as needed for chest pain.    pantoprazole (PROTONIX) 40 MG tablet Take 40 mg by mouth daily.    rosuvastatin (CRESTOR) 10 MG tablet Take 1 tablet (10 mg total) by mouth daily. Qty: 30 tablet, Refills: 11   Associated Diagnoses: Chest pain, unspecified chest pain type; Dyspnea; SOB (shortness of breath)    polyethylene glycol (MIRALAX) packet Take 17 g by mouth daily. Qty: 14 each, Refills: 0      STOP taking these medications     diclofenac sodium (VOLTAREN) 1 % GEL          Brief H and  P: For complete details please refer to admission H and P, but in brief Theresa Crosby a 83 y.o.femalewith medical history significant for CAD with prior CABG procedure in 2013 by Dr. Cyndia Bent, hypertension, dyslipidemia, anxiety disorder and asthma-like syndrome. Patient tripped over a chair and fell. She did not express an loss of consciousness. She has since experienced significant thigh pain and x-ray in the ER reveals a left femoral neck fracture that is impacted. Orthopedics was consulted, patient underwent total hip replacement on 5/11.  Hospital Course:  Left displaced femoral neck fracture  (HCC) - Status post mechanical fall, presented with leg pain, x-ray showed femoral neck fracture - Orthopedics was consulted, patient underwent total hip replacement on 5/11, postop day # 3 - Continue pain control, physical therapy - H&H stable, hemoglobin 9.8 at the time of discharge. - Follow up outpatient in 2 weeks with Dr. Erlinda Hong - Lovenox for DVT prophylaxis for 2 weeks     Hypertension, essential - Currently stable, continue Norvasc, Lopressor    HLD (hyperlipidemia) - Continue statin    Coronary heart disease - No complaints, asymptomatic, continue aspirin, statin, beta blocker - Stress test in 2016 low risk study    Asthma -Currently stable, no wheezing   Day of Discharge BP 112/60 (BP Location: Left Arm)   Pulse 94   Temp 100 F (37.8 C) (Oral)   Resp 16   Ht 4\' 11"  (1.499 m)   Wt 46.4 kg (102 lb 6.4 oz)   SpO2 95%   BMI 20.68 kg/m   Physical Exam: General: Alert and awake oriented x3 not in any acute distress. HEENT: anicteric sclera, pupils reactive to light and accommodation CVS: S1-S2 clear no murmur rubs or gallops Chest: clear to auscultation bilaterally, no wheezing rales or rhonchi Abdomen: soft nontender, nondistended, normal bowel sounds Extremities: no cyanosis, clubbing or edema noted bilaterally Neuro: Cranial nerves II-XII intact, no focal neurological deficits   The results of significant diagnostics from this hospitalization (including imaging, microbiology, ancillary and laboratory) are listed below for reference.    LAB RESULTS: Basic Metabolic Panel:  Recent Labs Lab 06/27/16 0719 06/28/16 0633  NA 137 135  K 3.7 4.3  CL 104 101  CO2 22 24  GLUCOSE 134* 116*  BUN 10 14  CREATININE 0.70 0.81  CALCIUM 9.0 8.9   Liver Function Tests:  Recent Labs Lab 06/26/16 1127  AST 29  ALT 21  ALKPHOS 83  BILITOT 0.3  PROT 6.7  ALBUMIN 4.4   No results for input(s): LIPASE, AMYLASE in the last 168 hours. No results for  input(s): AMMONIA in the last 168 hours. CBC:  Recent Labs Lab 06/26/16 1127  06/28/16 0633 06/29/16 0516  WBC 4.8  < > 11.5* 9.8  NEUTROABS 2.5  --   --   --   HGB 13.2  < > 10.9* 9.8*  HCT 39.7  < > 32.5* 29.1*  MCV 91.3  < > 90.3 90.4  PLT 216  < > 164 141*  < > = values in this interval not displayed. Cardiac Enzymes: No results for input(s): CKTOTAL, CKMB, CKMBINDEX, TROPONINI in the last 168 hours. BNP: Invalid input(s): POCBNP CBG: No results for input(s): GLUCAP in the last 168 hours.  Significant Diagnostic Studies:  Pelvis Portable  Result Date: 06/26/2016 CLINICAL DATA:  Status post left hip arthroplasty. Initial encounter. EXAM: PORTABLE PELVIS 1-2 VIEWS COMPARISON:  CT of the abdomen and pelvis from 02/18/2016 FINDINGS: The patient's left hip arthroplasty is grossly  unremarkable in appearance, without evidence of loosening or new fracture. The right hip joint is grossly unremarkable in appearance. Mild degenerative change is noted at the lower lumbar spine. The sacroiliac joints are grossly unremarkable. The visualized bowel gas pattern is within normal limits. Scattered soft tissue air is seen tracking about the left hip and left side of the pelvis. Overlying skin staples are seen. IMPRESSION: 1. Left hip arthroplasty is grossly unremarkable in appearance, without evidence of loosening or new fracture. 2. Scattered postoperative soft tissue air about the left hip and left side of the pelvis. Electronically Signed   By: Garald Balding M.D.   On: 06/26/2016 20:02   Dg Chest Port 1 View  Result Date: 06/26/2016 CLINICAL DATA:  Preoperative examination. EXAM: PORTABLE CHEST 1 VIEW COMPARISON:  02/24/2016 FINDINGS: Prior median sternotomy. Both lungs are clear. Heart size is normal. The trachea is midline. Negative for a pneumothorax. Bony thorax is intact. IMPRESSION: No active disease. Electronically Signed   By: Markus Daft M.D.   On: 06/26/2016 14:00   Dg C-arm 61-120  Min-no Report  Result Date: 06/26/2016 Fluoroscopy was utilized by the requesting physician.  No radiographic interpretation.   Dg Hip Operative Unilat W Or W/o Pelvis Left  Result Date: 06/26/2016 CLINICAL DATA:  Total left hip arthroplasty EXAM: OPERATIVE LEFT HIP (WITH PELVIS IF PERFORMED) 1 VIEWS TECHNIQUE: Fluoroscopic spot image(s) were submitted for interpretation post-operatively. COMPARISON:  Radiography from earlier today FINDINGS: Fluoroscopy shows left femoral neck fracture treated with total hip arthroplasty. No unexpected finding. IMPRESSION: Fluoroscopy for total hip arthroplasty. Electronically Signed   By: Monte Fantasia M.D.   On: 06/26/2016 18:04   Dg Hip Unilat With Pelvis 2-3 Views Left  Result Date: 06/26/2016 CLINICAL DATA:  Recent fall with left hip pain, initial encounter EXAM: DG HIP (WITH OR WITHOUT PELVIS) 2-3V LEFT COMPARISON:  None. FINDINGS: There is a left femoral neck fracture with impaction and angulation at the fracture site. The femoral head remains well seated. The pelvic ring is intact. IMPRESSION: Left femoral neck fracture with impaction Electronically Signed   By: Inez Catalina M.D.   On: 06/26/2016 12:12    2D ECHO:   Disposition and Follow-up: Discharge Instructions    Diet - low sodium heart healthy    Complete by:  As directed    Increase activity slowly    Complete by:  As directed    Weight bearing as tolerated    Complete by:  As directed        [place] information for follow-up providers    Leandrew Koyanagi, MD Follow up in 2 week(s).   Specialty:  Orthopedic Surgery Why:  For suture removal, For wound re-check Contact information: [address] [place] [address] [phone]            Contact information for after-discharge care    Destination    [facility] .   Specialty:  Skilled Nursing Facility Contact information: [place] [phone]                   Time spent on Discharge: 25 minutes  Signed:   Estill Cotta M.D. [facility] 06/29/2016, 12:52 PM Pager: [phone]

## 2016-06-29 NOTE — Care Management Note (Signed)
Case Management Note  Patient Details  Name: Theresa Crosby MRN: [id number] Date of Birth: 09-Dec-1932  Subjective/Objective:                 DC to SNF as facilitated by CSW. Pending authorization by [facility] which may take 24 hours.    Action/Plan:   Expected Discharge Date:  06/29/16               Expected Discharge Plan:  Skilled Nursing Facility  In-House Referral:  Clinical Social Work  Discharge planning Services  CM Consult  Post Acute Care Choice:    Choice offered to:     DME Arranged:    DME Agency:     HH Arranged:    [place] Agency:     Status of Service:  Completed, signed off  If discussed at [employer], dates discussed:    Additional Comments:  Carles Collet, RN 06/29/2016, 9:15 AM

## 2016-06-30 DIAGNOSIS — I519 Heart disease, unspecified: Secondary | ICD-10-CM

## 2016-06-30 DIAGNOSIS — E784 Other hyperlipidemia: Secondary | ICD-10-CM

## 2016-06-30 DIAGNOSIS — I10 Essential (primary) hypertension: Secondary | ICD-10-CM

## 2016-06-30 DIAGNOSIS — W19XXXD Unspecified fall, subsequent encounter: Secondary | ICD-10-CM

## 2016-06-30 LAB — BPAM RBC
Blood Product Expiration Date: 201805282359
Blood Product Expiration Date: 201805282359
Unit Type and Rh: 6200
Unit Type and Rh: 6200

## 2016-06-30 LAB — TYPE AND SCREEN
ABO/RH(D): A POS
Antibody Screen: NEGATIVE
Unit division: 0
Unit division: 0

## 2016-06-30 NOTE — Progress Notes (Signed)
[facility]                                                                              Patient Demographics  Theresa Crosby, is a 83 y.o. female, DOB - [birth year], Theresa Crosby:[id number]  Admit date - 06/26/2016   Admitting Physician Theresa Mocha, MD  Outpatient Primary MD for the patient is Theresa Crosby, Pcp Not In  Outpatient specialists:   LOS - 4  days    Chief Complaint  Patient presents with  . Fall       Brief summary   Theresa Crosby is a 83 y.o. female with medical history significant for CAD with prior CABG procedure in 2013 by Dr. Cyndia Crosby, hypertension, dyslipidemia, anxiety disorder and asthma-like syndrome. Patient tripped over a chair and fell. She did not express an loss of consciousness. She has since experienced significant thigh pain and x-ray in the ER reveals a left femoral neck fracture that is impacted. Orthopedics was consulted, patient underwent total hip replacement on 5/11.   Assessment & Plan    Principal Problem:   Left displaced femoral neck fracture (HCC) - Status post mechanical fall, presented with leg pain, x-ray showed femoral neck fracture - Orthopedics was consulted, patient underwent total hip replacement on 5/11, postop day # 4 - Continue pain control, physical therapy - Patient is Medically ready for discharge, waiting for skilled nursing facility, DC summary done yesterday, 5/14, no changes  Active Problems:   Hypertension, essential - Currently stable, continue Norvasc, Lopressor    HLD (hyperlipidemia) - Continue statin    Coronary heart disease - No complaints, asymptomatic, continue aspirin, statin, beta blocker - Stress test in 2016 low risk study    Asthma -Currently stable, no wheezing  Code Status: Full CODE STATUS DVT Prophylaxis:  Lovenox  Family Communication: Discussed in detail with the patient, all imaging results, lab results explained to the patient    Disposition Plan:  Skilled nursing facility  today, DC summary done on 5/14, no changes  Time Spent in minutes   15 minutes  Procedures:  Operative Procedures  1. Total hip replacement; Left hip  Consultants:   Orthopedics   Antimicrobials:   None   Medications  Scheduled Meds: . amLODipine  5 mg Oral Daily  . aspirin EC  81 mg Oral Daily  . cholecalciferol  2,000 Units Oral [id number]  . dorzolamide-timolol  1 drop Both Eyes BID  . enoxaparin (LOVENOX) injection  40 mg Subcutaneous Q24H  . fluticasone  2 spray Each Nare Daily  . latanoprost  1 drop Both Eyes QHS  . metoprolol tartrate  25 mg Oral BID  . mometasone-formoterol  2 puff Inhalation BID  . pantoprazole  40 mg Oral Daily  . polyethylene glycol  17 g Oral Daily  . rosuvastatin  10 mg Oral Daily  . senna-docusate  1 tablet Oral BID   Continuous Infusions: . lactated ringers    . methocarbamol (ROBAXIN)  IV     PRN Meds:.acetaminophen **OR** acetaminophen, albuterol, alum & mag hydroxide-simeth, HYDROcodone-acetaminophen, loratadine, menthol-cetylpyridinium **OR** phenol, methocarbamol **OR** methocarbamol (ROBAXIN)  IV, metoCLOPramide **OR** metoCLOPramide (REGLAN) injection, morphine injection,  ondansetron **OR** ondansetron (ZOFRAN) IV, oxyCODONE   Antibiotics   Anti-infectives    Start     Dose/Rate Route Frequency Ordered Stop   06/27/16 0600  ceFAZolin (ANCEF) IVPB 2g/100 mL premix  Status:  Discontinued     2 g 200 mL/hr over 30 Minutes Intravenous On call to O.R. 06/26/16 1600 06/26/16 1609   06/26/16 2130  ceFAZolin (ANCEF) IVPB 2g/100 mL premix     2 g 200 mL/hr over 30 Minutes Intravenous Every 6 hours 06/26/16 2119 06/27/16 0815   06/26/16 1545  ceFAZolin (ANCEF) IVPB 2g/100 mL premix     2 g 200 mL/hr over 30 Minutes Intravenous To [facility] 06/26/16 1525 06/26/16 1640   06/26/16 1532  ceFAZolin (ANCEF) 2-4 GM/100ML-% IVPB    Comments:  Schonewitz, Leigh   : cabinet override      06/26/16 1532 06/26/16 1640         Subjective:   Theresa Crosby was seen and examined today. No complaints, awaiting skilled nursing facility. Pain controlled. Had a bowel movement yesterday. Patient denies dizziness, chest pain, shortness of breath, abdominal pain, N/V/D/C, new weakness, numbess, tingling. No acute events overnight.    Objective:   Vitals:   06/29/16 1955 06/29/16 2032 06/30/16 0545 06/30/16 0757  BP: (!) 145/77  132/65   Pulse: 96  92   Resp: 18  16   Temp: 99.9 F (37.7 C)  98.6 F (37 C)   TempSrc: Oral  Oral   SpO2: 98% 99% 98% 97%  Weight:      Height:        Intake/Output Summary (Last 24 hours) at 06/30/16 1134 Last data filed at 06/29/16 1500  Gross per 24 hour  Intake              100 ml  Output                0 ml  Net              100 ml     Wt Readings from Last 3 Encounters:  06/26/16 46.4 kg (102 lb 6.4 oz)  02/18/16 47.2 kg (104 lb)  12/10/15 45.4 kg (100 lb)     Exam  General: Alert and oriented 3, NAD.  HEENT:  PERRLA, EOMI  Neck: Supple, no JVD  Cardiovascular: S1-S2 clear, RRR  Respiratory: CTA B  Gastrointestinal: Soft, nontender, nondistended, normal bowel sounds  Ext: no cyanosis, clubbing, edema  Neuro: No new deficits   Skin: No rashes  Psych: alert and oriented 3, normal affect   Data Reviewed:  I have personally reviewed following labs and imaging studies  Micro Results Recent Results (from the past 240 hour(s))  Surgical pcr screen     Status: None   Collection Time: 06/26/16  4:00 PM  Result Value Ref Range Status   MRSA, PCR NEGATIVE NEGATIVE Final   Staphylococcus aureus NEGATIVE NEGATIVE Final    Comment:        The Xpert SA Assay (FDA approved for NASAL specimens in patients over 91 years of age), is one component of a comprehensive surveillance program.  Test performance has been validated by [facility] for patients greater than or equal to 30 year old. It is not intended to diagnose infection nor to guide or  monitor treatment.     Radiology Reports Pelvis Portable  Result Date: 06/26/2016 CLINICAL DATA:  Status post left hip arthroplasty. Initial encounter. EXAM: PORTABLE PELVIS 1-2 VIEWS COMPARISON:  CT of the abdomen and pelvis from 02/18/2016 FINDINGS: The patient's left hip arthroplasty is grossly unremarkable in appearance, without evidence of loosening or new fracture. The right hip joint is grossly unremarkable in appearance. Mild degenerative change is noted at the lower lumbar spine. The sacroiliac joints are grossly unremarkable. The visualized bowel gas pattern is within normal limits. Scattered soft tissue air is seen tracking about the left hip and left side of the pelvis. Overlying skin staples are seen. IMPRESSION: 1. Left hip arthroplasty is grossly unremarkable in appearance, without evidence of loosening or new fracture. 2. Scattered postoperative soft tissue air about the left hip and left side of the pelvis. Electronically Signed   By: Garald Balding M.D.   On: 06/26/2016 20:02   Dg Chest Port 1 View  Result Date: 06/26/2016 CLINICAL DATA:  Preoperative examination. EXAM: PORTABLE CHEST 1 VIEW COMPARISON:  02/24/2016 FINDINGS: Prior median sternotomy. Both lungs are clear. Heart size is normal. The trachea is midline. Negative for a pneumothorax. Bony thorax is intact. IMPRESSION: No active disease. Electronically Signed   By: Markus Daft M.D.   On: 06/26/2016 14:00   Dg C-arm 61-120 Min-no Report  Result Date: 06/26/2016 Fluoroscopy was utilized by the requesting physician.  No radiographic interpretation.   Dg Hip Operative Unilat W Or W/o Pelvis Left  Result Date: 06/26/2016 CLINICAL DATA:  Total left hip arthroplasty EXAM: OPERATIVE LEFT HIP (WITH PELVIS IF PERFORMED) 1 VIEWS TECHNIQUE: Fluoroscopic spot image(s) were submitted for interpretation post-operatively. COMPARISON:  Radiography from earlier today FINDINGS: Fluoroscopy shows left femoral neck fracture treated with  total hip arthroplasty. No unexpected finding. IMPRESSION: Fluoroscopy for total hip arthroplasty. Electronically Signed   By: Monte Fantasia M.D.   On: 06/26/2016 18:04   Dg Hip Unilat With Pelvis 2-3 Views Left  Result Date: 06/26/2016 CLINICAL DATA:  Recent fall with left hip pain, initial encounter EXAM: DG HIP (WITH OR WITHOUT PELVIS) 2-3V LEFT COMPARISON:  None. FINDINGS: There is a left femoral neck fracture with impaction and angulation at the fracture site. The femoral head remains well seated. The pelvic ring is intact. IMPRESSION: Left femoral neck fracture with impaction Electronically Signed   By: Inez Catalina M.D.   On: 06/26/2016 12:12    Lab Data:  CBC:  Recent Labs Lab 06/26/16 1127 06/27/16 0719 06/28/16 0633 06/29/16 0516  WBC 4.8 12.5* 11.5* 9.8  NEUTROABS 2.5  --   --   --   HGB 13.2 10.7* 10.9* 9.8*  HCT 39.7 33.1* 32.5* 29.1*  MCV 91.3 90.9 90.3 90.4  PLT 216 179 164 825*   Basic Metabolic Panel:  Recent Labs Lab 06/26/16 1127 06/27/16 0719 06/28/16 0633  NA 139 137 135  K 4.2 3.7 4.3  CL 105 104 101  CO2 26 22 24   GLUCOSE 118* 134* 116*  BUN 8 10 14   CREATININE 0.65 0.70 0.81  CALCIUM 9.4 9.0 8.9   GFR: Estimated Creatinine Clearance: 36.5 mL/min (by C-G formula based on SCr of 0.81 mg/dL). Liver Function Tests:  Recent Labs Lab 06/26/16 1127  AST 29  ALT 21  ALKPHOS 83  BILITOT 0.3  PROT 6.7  ALBUMIN 4.4   No results for input(s): LIPASE, AMYLASE in the last 168 hours. No results for input(s): AMMONIA in the last 168 hours. Coagulation Profile: No results for input(s): INR, PROTIME in the last 168 hours. Cardiac Enzymes: No results for input(s): CKTOTAL, CKMB, CKMBINDEX, TROPONINI in the last 168 hours. BNP (last 3 results)  No results for input(s): PROBNP in the last 8760 hours. HbA1C: No results for input(s): HGBA1C in the last 72 hours. CBG: No results for input(s): GLUCAP in the last 168 hours. Lipid Profile: No results  for input(s): CHOL, HDL, LDLCALC, TRIG, CHOLHDL, LDLDIRECT in the last 72 hours. Thyroid Function Tests: No results for input(s): TSH, T4TOTAL, FREET4, T3FREE, THYROIDAB in the last 72 hours. Anemia Panel: No results for input(s): VITAMINB12, FOLATE, FERRITIN, TIBC, IRON, RETICCTPCT in the last 72 hours. Urine analysis:    Component Value Date/Time   COLORURINE YELLOW 02/18/2016 1600   APPEARANCEUR CLOUDY (A) 02/18/2016 1600   LABSPEC 1.011 02/18/2016 1600   PHURINE 7.5 02/18/2016 1600   GLUCOSEU NEGATIVE 02/18/2016 1600   HGBUR NEGATIVE 02/18/2016 1600   BILIRUBINUR NEGATIVE 02/18/2016 1600   KETONESUR NEGATIVE 02/18/2016 1600   PROTEINUR NEGATIVE 02/18/2016 1600   UROBILINOGEN 1.0 11/28/2014 1410   NITRITE NEGATIVE 02/18/2016 1600   LEUKOCYTESUR TRACE (A) 02/18/2016 1600     Baeleigh Devincent M.D. [facility] 06/30/2016, 11:34 AM  Pager: [phone] Between 7am to 7pm - call Pager - [phone]  After 7pm go to [email or web address] - password [email or web address]  Call night coverage person covering after 7pm

## 2016-06-30 NOTE — Progress Notes (Signed)
Physical Therapy Treatment Patient Details Name: Theresa Crosby MRN: [id number] DOB: 1932-05-02 Today's Date: 06/30/2016    History of Present Illness Pt is 83 yo female with c/o L thigh pain s/p fall on 06/26/16. dx L intertrochanteric fx s/p L THA 06/26/16. PMH significant for CAD/CABG, HTN, L ventricular dysfunction,OA, spondolysithesis, and CA.      PT Comments    Pt  Ambulated 130ft in hall way. Pt demonstrates good technique with RW and overall steady, however her gait speed is significantly decreased. Min guard for gait and transfers. Pt required assist with toileting as she is unable to wipe. Patient would benefit from continued skilled PT. Will continue to follow acutely.    Follow Up Recommendations  SNF;Supervision/Assistance - 24 hour     Equipment Recommendations  Rolling walker with 5" wheels;Other (comment) (youth, defer to next venue)    Recommendations for Other Services       Precautions / Restrictions Precautions Precautions: Anterior Hip;Fall Precaution Booklet Issued: Yes (comment) Precaution Comments: reviewed precautions with pt Restrictions Weight Bearing Restrictions: Yes LLE Weight Bearing: Weight bearing as tolerated    Mobility  Bed Mobility               General bed mobility comments: pt OOB in chair upon arrival  Transfers Overall transfer level: Needs assistance Equipment used: Rolling walker (2 wheeled) Transfers: Sit to/from Stand (x3) Sit to Stand: Min guard         General transfer comment: Cueing for hand placement and upright posture during sit<>stand. Performed 3x including transfer to [place] for pt to perform toileting.   Ambulation/Gait Ambulation/Gait assistance: Min guard Ambulation Distance (Feet): 100 Feet Assistive device: Rolling walker (2 wheeled) Gait Pattern/deviations: Step-to pattern;Decreased stance time - left;Decreased step length - right;Decreased weight shift to left;Trunk flexed;Antalgic;Decreased  dorsiflexion - left Gait velocity: decreased Gait velocity interpretation: Below normal speed for age/gender General Gait Details: vc for sequencing, pt with steady gait but cadence is very decreased.    Stairs            Wheelchair Mobility    Modified Rankin (Stroke Patients Only)       Balance Overall balance assessment: Needs assistance Sitting-balance support: No upper extremity supported Sitting balance-Leahy Scale: Good Sitting balance - Comments: Able to sit on BSC without support Postural control: Right lateral lean Standing balance support: Bilateral upper extremity supported;During functional activity;No upper extremity supported Standing balance-Leahy Scale: Fair Standing balance comment: relies on RW gait, however was able to stand at sink with no UE support to perform hand hygiene                            Cognition Arousal/Alertness: Awake/alert Behavior During Therapy: WFL for tasks assessed/performed Overall Cognitive Status: Within Functional Limits for tasks assessed                                 General Comments: unable to state any anterior hip precautions      Exercises Total Joint Exercises Ankle Circles/Pumps: AROM;Both;Seated;10 reps Quad Sets: AROM;Left;10 reps;Seated Towel Squeeze: AROM;10 reps;Seated Heel Slides: AROM;Left;10 reps;Seated Long Arc Quad: AROM;Left;10 reps    General Comments        Pertinent Vitals/Pain Pain Assessment: No/denies pain Pain Location: L thigh  Pain Descriptors / Indicators: Tightness Pain Intervention(s): Monitored during session    Home Living  Prior Function            PT Goals (current goals can now be found in the care plan section) Acute Rehab PT Goals Patient Stated Goal: get strong enough to go home PT Goal Formulation: With patient Time For Goal Achievement: 07/11/16 Potential to Achieve Goals: Good Progress towards PT goals:  Progressing toward goals    Frequency    Min 5X/week      PT Plan Frequency needs to be updated    Co-evaluation              AM-PAC PT "6 Clicks" Daily Activity  Outcome Measure  Difficulty turning over in bed (including adjusting bedclothes, sheets and blankets)?: Total Difficulty moving from lying on back to sitting on the side of the bed? : Total Difficulty sitting down on and standing up from a chair with arms (e.g., wheelchair, bedside commode, etc,.)?: A Little Help needed moving to and from a bed to chair (including a wheelchair)?: A Little Help needed walking in hospital room?: A Little Help needed climbing 3-5 steps with a railing? : A Lot 6 Click Score: 13    End of Session Equipment Utilized During Treatment: Gait belt Activity Tolerance: Patient tolerated treatment well Patient left: in chair;with call bell/phone within reach Nurse Communication: Patient requests pain meds PT Visit Diagnosis: Unsteadiness on feet (R26.81);Other abnormalities of gait and mobility (R26.89);History of falling (Z91.81);Muscle weakness (generalized) (M62.81);Other symptoms and signs involving the nervous system (R29.898)     Time: 9774-1423 PT Time Calculation (min) (ACUTE ONLY): 58 min  Charges:  $Gait Training: 23-37 mins $Therapeutic Exercise: 8-22 mins $Therapeutic Activity: 8-22 mins                    G Codes:      Benjiman Core, PTA Acute Rehab [place] 06/30/2016, 1:18 PM

## 2016-06-30 NOTE — Progress Notes (Addendum)
Patient to be discharged to [facility]. Nurse called to give report. Nurse left message on Answering service for them to call back for report. Nurse will try again. IV removed. Patient is waiting for [facility] for transportation.   Nurse tried to call back for report to be given at [facility]. They are still unavailable. Message left on machine for call back for report

## 2016-06-30 NOTE — Progress Notes (Signed)
PT Cancellation Note  Patient Details Name: Theresa Crosby MRN: [id number] DOB: Aug 04, 1932   Cancelled Treatment:    Reason Eval/Treat Not Completed: Patient declined, no reason specified;Other (pt bathing on arrival. Will check back if time allows).  Benjiman Core, PTA Acute Rehab [place] 06/30/2016, 10:14 AM

## 2016-06-30 NOTE — Care Management Important Message (Signed)
Important Message  Patient Details  Name: Theresa Crosby MRN: [id number] Date of Birth: [birth year]   Medicare Important Message Given:  Yes    Orbie Pyo 06/30/2016, 12:28 PM

## 2016-06-30 NOTE — Clinical Social Work Placement (Signed)
   CLINICAL SOCIAL WORK PLACEMENT  NOTE  Date:  06/30/2016  Patient Details  Name: Theresa Crosby MRN: [id number] Date of Birth: [birth year]  Clinical Social Work is seeking post-discharge placement for this patient at the [place] level of care (*CSW will initial, date and re-position this form in  chart as items are completed):  Yes   Patient/family provided with [place] Work Department's list of facilities offering this level of care within the geographic area requested by the patient (or if unable, by the patient's family).  Yes   Patient/family informed of their freedom to choose among providers that offer the needed level of care, that participate in Medicare, Medicaid or managed care program needed by the patient, have an available bed and are willing to accept the patient.  Yes   Patient/family informed of [facility]'s ownership interest in [facility] and [facility], as well as of the fact that they are under no obligation to receive care at these facilities.  PASRR submitted to EDS on       PASRR number received on       Existing PASRR number confirmed on 06/28/16     FL2 transmitted to all facilities in geographic area requested by pt/family on 06/28/16     FL2 transmitted to all facilities within larger geographic area on       Patient informed that his/her managed care company has contracts with or will negotiate with certain facilities, including the following:        Yes   Patient/family informed of bed offers received.  Patient chooses bed at [facility]     Physician recommends and patient chooses bed at      Patient to be transferred to [facility] on 06/30/16.  Patient to be transferred to facility by PTAR     Patient family notified on 06/30/16 of transfer.  Name of family member notified:  Rodell Perna     PHYSICIAN       Additional Comment:  Pt ready to discharge today to [facility]. Pt and dtr-Lee  Harris (via phone) aware and agreeable to discharge plan. CSW confirmed bed offer and insurance auth with Kirstin (admissions) at [facility] Room and report placed in treatment team sticky note and provided to RN. CSW arranged transportation with PTAR. CSW signing off as no further social work needs identified.   _______________________________________________ Truitt Merle, LCSW 06/30/2016, 3:27 PM

## 2016-07-03 ENCOUNTER — Encounter: Payer: Self-pay | Admitting: Internal Medicine

## 2016-07-03 ENCOUNTER — Non-Acute Institutional Stay (SKILLED_NURSING_FACILITY): Payer: Medicare HMO | Admitting: Internal Medicine

## 2016-07-03 DIAGNOSIS — I25119 Atherosclerotic heart disease of native coronary artery with unspecified angina pectoris: Secondary | ICD-10-CM

## 2016-07-03 DIAGNOSIS — D500 Iron deficiency anemia secondary to blood loss (chronic): Secondary | ICD-10-CM | POA: Diagnosis not present

## 2016-07-03 DIAGNOSIS — I10 Essential (primary) hypertension: Secondary | ICD-10-CM

## 2016-07-03 DIAGNOSIS — K5900 Constipation, unspecified: Secondary | ICD-10-CM

## 2016-07-03 DIAGNOSIS — J45998 Other asthma: Secondary | ICD-10-CM

## 2016-07-03 DIAGNOSIS — S72002A Fracture of unspecified part of neck of left femur, initial encounter for closed fracture: Secondary | ICD-10-CM

## 2016-07-03 DIAGNOSIS — J309 Allergic rhinitis, unspecified: Secondary | ICD-10-CM

## 2016-07-03 DIAGNOSIS — R2681 Unsteadiness on feet: Secondary | ICD-10-CM

## 2016-07-03 DIAGNOSIS — J45909 Unspecified asthma, uncomplicated: Secondary | ICD-10-CM

## 2016-07-03 NOTE — Progress Notes (Signed)
LOCATION: [place]  PCP: System, Pcp Not In   Code Status: Full Code  Goals of care: Advanced Directive information Advanced Directives 06/27/2016  Does Patient Have a Medical Advance Directive? Yes  Type of Advance Directive [place]  Does patient want to make changes to medical advance directive? No - Patient declined  Copy of Ava in Chart? No - copy requested  Would patient like information on creating a medical advance directive? No - Patient declined  Pre-existing out of facility DNR order (yellow form or pink MOST form) -       Extended Emergency Contact Information Primary Emergency Contact: Donavan [place] Mobile Phone: [phone] Relation: Daughter   Allergies  Allergen Reactions  . Aspirin Other (See Comments)    GI upset,high doses  . Lactose Intolerance (Gi) Other (See Comments)    unspecified    Chief Complaint  Patient presents with  . New Admit To SNF    New Admission Visit      HPI:  Patient is a 83 y.o. female seen today for short term rehabilitation post hospital admission from 06/26/16-06/29/16 post fall with left displaced femoral neck fracture. She underwent hip replacement surgery on 06/26/16. She has PMH of HTN, HLD, CAD, asthma. She is seen in her room today.   Review of Systems:  Constitutional: Negative for fever, chills, diaphoresis.  HENT: Negative for headache, congestion, nasal discharge, difficulty swallowing.   Eyes: Negative for eye pain, blurred vision, double vision and discharge.  Respiratory: Negative for cough, shortness of breath and wheezing.   Cardiovascular: Negative for chest pain, palpitations, leg swelling.  Gastrointestinal: Negative for heartburn, nausea, vomiting, abdominal pain, loss of appetite. Last bowel movement was in the hospital prior to discharge.  Genitourinary: Positive for occasional dysuria. Musculoskeletal: Negative for back pain,  fall in the facility. Positive for pain to left groin area and left leg.   Skin: Negative for itching, rash.  Neurological: Negative for dizziness. Psychiatric/Behavioral: Negative for depression   Past Medical History:  Diagnosis Date  . Abnormal nuclear stress test 10/05/2011  . Anxiety    2005- related to care of aging parent   . Arthritis    degeneration - spondylosis, OA - all over body,   . Blood transfusion    postop- back surg. 1987  . Bunion   . CAD (coronary artery disease)significant diseas with 99% LAD, 80% 1st diag, 80% LCX 10/05/2011  . Chest pain at rest associated with dyspnea 10/05/2011  . GERD (gastroesophageal reflux disease)   . Hyperlipemia   . Hypertension    ireg. heartbeat, no referral made to cardiologist , sees [facility]. Med.   . Neuromuscular disorder (HCC)    burning- L leg , numbing of toes   . S/P CABG x 4, LIMA-LAD, SVG-OM2, SVG-diag, SVG-RCA secondary to symptomatic CAD and positive stress test 10/05/2011  . Shortness of breath    Past Surgical History:  Procedure Laterality Date  . ABDOMINAL HYSTERECTOMY     partial hysterectomy- 1972  . ANTERIOR APPROACH HEMI HIP ARTHROPLASTY Left 06/26/2016   Procedure: ANTERIOR APPROACH TOTAL HIP ARTHROPLASTY;  Surgeon: Leandrew Koyanagi, MD;  Location: [place];  Service: Orthopedics;  Laterality: Left;  . BACK SURGERY     1987-   . BREAST CYST ASPIRATION  06/18/2010  . CARDIAC CATHETERIZATION  10/12/2011   high-grade ca+ prox. LAD diagonal branch,higly diseased diagonal branch & obtuse marginal branch  . CORONARY ARTERY  BYPASS GRAFT  10/04/2011   Procedure: CORONARY ARTERY BYPASS GRAFTING (CABG);  Surgeon: Gaye Pollack, MD;  Location: [place];  Service: Open Heart Surgery;  Laterality: N/A;  CABG x four using left internal mammary artery and right leg greater saphenous vein   . EYE SURGERY     cataracts removed- bilateral, no IOL  . FOOT SURGERY Right January 2013   HT 5  . LEFT HEART CATHETERIZATION WITH CORONARY  ANGIOGRAM N/A 10/02/2011   Procedure: LEFT HEART CATHETERIZATION WITH CORONARY ANGIOGRAM;  Surgeon: Lorretta Harp, MD;  Location[facility] C[facility] LAB;  Service: Cardiovascular;  Laterality: N/A;  . LUMBAR LAMINECTOMY/DECOMPRESSION MICRODISCECTOMY  03/04/2011   Procedure: LUMBAR LAMINECTOMY/DECOMPRESSION MICRODISCECTOMY;  Surgeon: Hosie Spangle, MD;  Location: [facility];  Service: Neurosurgery;  Laterality: N/A;  Thoracic Ten-Thoracic Twelve Thoracic Laminectomy  . NM MYOVIEW LTD  09/15/2011   mild ischemia mid anterior & apical anterior region.EF: 86%  . TOE SURGERY     R foot- removed calus between 5-4  . TUBAL LIGATION     Social History:   reports that she has never smoked. She has never used smokeless tobacco. She reports that she does not drink alcohol or use drugs.  Family History  Problem Relation Age of Onset  . Heart attack Mother   . Heart attack Father   . Hypertension Father   . Heart attack Brother   . Anesthesia problems Neg Hx   . Hypotension Neg Hx   . Malignant hyperthermia Neg Hx   . Pseudochol deficiency Neg Hx     Medications: Allergies as of 07/03/2016      Reactions   Aspirin Other (See Comments)   GI upset,high doses   Lactose Intolerance (gi) Other (See Comments)   unspecified      Medication List       Accurate as of 07/03/16  8:58 AM. Always use your most recent med list.          ADVAIR DISKUS 100-50 MCG/DOSE Aepb Generic drug:  Fluticasone-Salmeterol Inhale 1 puff into the lungs every 12 (twelve) hours as needed for wheezing.   albuterol 108 (90 Base) MCG/ACT inhaler Commonly known as:  PROVENTIL HFA;VENTOLIN HFA Inhale 1-2 puffs into the lungs every 6 (six) hours as needed for wheezing or shortness of breath.   amLODipine 5 MG tablet Commonly known as:  NORVASC Take 5 mg by mouth daily.   aspirin EC 81 MG tablet Take 1 tablet (81 mg total) by mouth daily.   BETADINE SWABSTICKS EX Apply 1 application topically 2 (two) times daily.     dorzolamide-timolol 22.3-6.8 MG/ML ophthalmic solution Commonly known as:  COSOPT Place 1 drop into both eyes 2 (two) times daily.   enoxaparin 40 MG/0.4ML injection Commonly known as:  LOVENOX Inject 0.4 mLs (40 mg total) into the skin daily.   fluticasone 50 MCG/ACT nasal spray Commonly known as:  FLONASE Place 2 sprays into the nose as needed for rhinitis.   latanoprost 0.005 % ophthalmic solution Commonly known as:  XALATAN Place 1 drop into both eyes at bedtime.   loratadine 10 MG tablet Commonly known as:  CLARITIN Take 10 mg by mouth daily as needed for allergies.   magnesium oxide 400 MG tablet Commonly known as:  MAG-OX Take 400 mg by mouth daily.   metoprolol tartrate 25 MG tablet Commonly known as:  LOPRESSOR Take 25 mg by mouth 2 (two) times daily.   nitroGLYCERIN 0.4 MG SL tablet Commonly known as:  NITROSTAT Place 0.4 mg under the tongue every 5 (five) minutes as needed for chest pain.   oxyCODONE 5 MG immediate release tablet Commonly known as:  Oxy IR/ROXICODONE Take 1-3 tablets (5-15 mg total) by mouth every 4 (four) hours as needed.   pantoprazole 40 MG tablet Commonly known as:  PROTONIX Take 40 mg by mouth daily.   polyethylene glycol packet Commonly known as:  MIRALAX Take 17 g by mouth daily.   rosuvastatin 10 MG tablet Commonly known as:  CRESTOR Take 1 tablet (10 mg total) by mouth daily.   senna-docusate 8.6-50 MG tablet Commonly known as:  Senokot-S Take 1 tablet by mouth 2 (two) times daily.   Vitamin D3 1000 units Caps Take 2,000 Units by mouth every morning.       Immunizations:  There is no immunization history on file for this patient.   Physical Exam: Vitals:   07/03/16 0852  BP: 113/68  Pulse: 85  Resp: 16  Temp: 98.2 F (36.8 C)  TempSrc: Oral  SpO2: 96%  Weight: 107 lb 6.4 oz (48.7 kg)  Height: 4\' 11"  (1.499 m)   Body mass index is 21.69 kg/m.  General- elderly female, well built, in no acute  distress Head- normocephalic, atraumatic Nose- no maxillary or frontal sinus tenderness, no nasal discharge Throat- moist mucus membrane, normal oropharynx  Eyes- PERRLA, EOMI, no pallor, no icterus, no discharge, normal conjunctiva, normal sclera Neck- no cervical lymphadenopathy Cardiovascular- normal s1,s2, no murmur Respiratory- bilateral clear to auscultation, no wheeze, no rhonchi, no crackles, no use of accessory muscles Abdomen- bowel sounds present, soft, non tender, no guarding or rigidity, no suprapubic tenderness Musculoskeletal- able to move all 4 extremities, limited left hip ROM, trace left leg edema Neurological- alert and oriented to person, place and time Skin- warm and dry, left hip surgical incision with staples in place Psychiatry- normal mood and affect    Labs reviewed: Basic Metabolic Panel:  Recent Labs  06/26/16 1127 06/27/16 0719 06/28/16 0633  NA 139 137 135  K 4.2 3.7 4.3  CL 105 104 101  CO2 26 22 24   GLUCOSE 118* 134* 116*  BUN 8 10 14   CREATININE 0.65 0.70 0.81  CALCIUM 9.4 9.0 8.9   Liver Function Tests:  Recent Labs  10/30/15 1405 06/26/16 1127  AST 21 29  ALT 19 21  ALKPHOS 85 83  BILITOT 0.5 0.3  PROT 6.8 6.7  ALBUMIN 4.6 4.4   No results for input(s): LIPASE, AMYLASE in the last 8760 hours. No results for input(s): AMMONIA in the last 8760 hours. CBC:  Recent Labs  06/26/16 1127 06/27/16 0719 06/28/16 0633 06/29/16 0516  WBC 4.8 12.5* 11.5* 9.8  NEUTROABS 2.5  --   --   --   HGB 13.2 10.7* 10.9* 9.8*  HCT 39.7 33.1* 32.5* 29.1*  MCV 91.3 90.9 90.3 90.4  PLT 216 179 164 141*   Cardiac Enzymes: No results for input(s): CKTOTAL, CKMB, CKMBINDEX, TROPONINI in the last 8760 hours. BNP: Invalid input(s): POCBNP CBG: No results for input(s): GLUCAP in the last 8760 hours.  Radiological Exams: Pelvis Portable  Result Date: 06/26/2016 CLINICAL DATA:  Status post left hip arthroplasty. Initial encounter. EXAM:  PORTABLE PELVIS 1-2 VIEWS COMPARISON:  CT of the abdomen and pelvis from 02/18/2016 FINDINGS: The patient's left hip arthroplasty is grossly unremarkable in appearance, without evidence of loosening or new fracture. The right hip joint is grossly unremarkable in appearance. Mild degenerative change is noted at the lower  lumbar spine. The sacroiliac joints are grossly unremarkable. The visualized bowel gas pattern is within normal limits. Scattered soft tissue air is seen tracking about the left hip and left side of the pelvis. Overlying skin staples are seen. IMPRESSION: 1. Left hip arthroplasty is grossly unremarkable in appearance, without evidence of loosening or new fracture. 2. Scattered postoperative soft tissue air about the left hip and left side of the pelvis. Electronically Signed   By: Garald Balding M.D.   On: 06/26/2016 20:02   Dg Chest Port 1 View  Result Date: 06/26/2016 CLINICAL DATA:  Preoperative examination. EXAM: PORTABLE CHEST 1 VIEW COMPARISON:  02/24/2016 FINDINGS: Prior median sternotomy. Both lungs are clear. Heart size is normal. The trachea is midline. Negative for a pneumothorax. Bony thorax is intact. IMPRESSION: No active disease. Electronically Signed   By: Markus Daft M.D.   On: 06/26/2016 14:00   Dg C-arm 61-120 Min-no Report  Result Date: 06/26/2016 Fluoroscopy was utilized by the requesting physician.  No radiographic interpretation.   Dg Hip Operative Unilat W Or W/o Pelvis Left  Result Date: 06/26/2016 CLINICAL DATA:  Total left hip arthroplasty EXAM: OPERATIVE LEFT HIP (WITH PELVIS IF PERFORMED) 1 VIEWS TECHNIQUE: Fluoroscopic spot image(s) were submitted for interpretation post-operatively. COMPARISON:  Radiography from earlier today FINDINGS: Fluoroscopy shows left femoral neck fracture treated with total hip arthroplasty. No unexpected finding. IMPRESSION: Fluoroscopy for total hip arthroplasty. Electronically Signed   By: Monte Fantasia M.D.   On: 06/26/2016  18:04   Dg Hip Unilat With Pelvis 2-3 Views Left  Result Date: 06/26/2016 CLINICAL DATA:  Recent fall with left hip pain, initial encounter EXAM: DG HIP (WITH OR WITHOUT PELVIS) 2-3V LEFT COMPARISON:  None. FINDINGS: There is a left femoral neck fracture with impaction and angulation at the fracture site. The femoral head remains well seated. The pelvic ring is intact. IMPRESSION: Left femoral neck fracture with impaction Electronically Signed   By: Inez Catalina M.D.   On: 06/26/2016 12:12    Assessment/Plan  Unsteady gait Will have her work with physical therapy and occupational therapy team to help with gait training and muscle strengthening exercises.fall precautions. Skin care. Encourage to be out of bed.   Left displaced femoral neck fracture S/p left hip replacement 06/26/16. Will need orthopedic appointment for follow up and removal of staples. WBAT to LLE. Will have patient work with PT/OT as tolerated to regain strength and restore function.  Fall precautions are in place. Continue lovenox for DVT prophylaxis x 2 weeks. Continue vitamin d supplement. Continue oxycodone IR 5 mg 1-3 tab q4h prn. PMR consult.  Blood loss anemia Post op, monitor cbc  Hypertension Continue metoprolol tartrate 25 mg bid and amlodipine 5 mg daily. Monitor BP   Constipation Monitor bowel movement and continue senna s bid, miralax daily  Asthma No recent exacerbation. Continue advair and prn proventil  Allergic rhinitis Continue flonase and claritin  gerd Continue protonix 40 mg daily  HLD Continue rosuvastatin 10 mg daily.   CAD Chest pain free. Continue aspirin, statin, metoprolol tartrate and prn NTG   Goals of care: short term rehabilitation   Labs/tests ordered: cbc, bmp 07/06/16  Family/ staff Communication: reviewed care plan with patient and nursing supervisor    Blanchie Serve, MD Internal Medicine [place],  [place] [address] Cell Phone (Monday-Friday 8 am - 5 pm): [phone] On Call: [phone] and follow prompts after 5 pm and on weekends Office  Phone: [phone] Office Fax: [phone]

## 2016-07-14 ENCOUNTER — Ambulatory Visit: Payer: Medicare HMO | Admitting: Podiatry

## 2016-07-17 ENCOUNTER — Ambulatory Visit (INDEPENDENT_AMBULATORY_CARE_PROVIDER_SITE_OTHER): Payer: Worker's Compensation | Admitting: Orthopaedic Surgery

## 2016-07-17 ENCOUNTER — Ambulatory Visit (INDEPENDENT_AMBULATORY_CARE_PROVIDER_SITE_OTHER): Payer: Worker's Compensation

## 2016-07-17 ENCOUNTER — Encounter (INDEPENDENT_AMBULATORY_CARE_PROVIDER_SITE_OTHER): Payer: Self-pay | Admitting: Orthopaedic Surgery

## 2016-07-17 ENCOUNTER — Telehealth (INDEPENDENT_AMBULATORY_CARE_PROVIDER_SITE_OTHER): Payer: Self-pay | Admitting: *Deleted

## 2016-07-17 DIAGNOSIS — S72002A Fracture of unspecified part of neck of left femur, initial encounter for closed fracture: Secondary | ICD-10-CM | POA: Diagnosis not present

## 2016-07-17 NOTE — Progress Notes (Signed)
Theresa Crosby is 3 weeks status post left total hip replacement for a femoral neck fracture. She is doing well. She is being discharged from [facility]. She has done very well with physical therapy and has met their goals. She does complain of a leg length discrepancy with the operative extremity being slightly longer. Her incision is well healed. The staples were removed today. X-ray show stable alignment of the fracture. Radiographically her operative extremity is longer. I gave her prescription for a shoe lift for the right leg. I do think that she needs home health physical therapy for continued strengthening and mobilization. I also gave her prescription for this. I'll see her back in 4 weeks for a standing AP pelvis as possible.

## 2016-07-17 NOTE — Telephone Encounter (Signed)
Sonia Side from [facility] called this afternoon to let us know that this patient's insurance is not in network with them. Unfortunately they will not be able to see this patient for Physical Therapy but Sonia Side wanted to thank [place] for the referral. Thank you

## 2016-07-20 NOTE — Telephone Encounter (Signed)
See message below °

## 2016-07-20 NOTE — Telephone Encounter (Signed)
Can we try advanced home health

## 2016-07-20 NOTE — Telephone Encounter (Signed)
FAXED TO [facility] 

## 2016-07-23 ENCOUNTER — Telehealth (INDEPENDENT_AMBULATORY_CARE_PROVIDER_SITE_OTHER): Payer: Self-pay

## 2016-07-23 MED ORDER — TRAMADOL HCL 50 MG PO TABS: 50.0000 mg | ORAL_TABLET | Freq: Every day | ORAL | 0 refills | Status: DC | PRN

## 2016-07-23 NOTE — Addendum Note (Signed)
Addended by: Precious Bard on: 07/23/2016 02:21 PM   Modules accepted: Orders

## 2016-07-23 NOTE — Telephone Encounter (Signed)
Called Rx Into pharm pt aware.

## 2016-07-23 NOTE — Telephone Encounter (Signed)
Ok  #30 

## 2016-07-23 NOTE — Telephone Encounter (Signed)
See message below °

## 2016-07-23 NOTE — Telephone Encounter (Signed)
Patty with [facility] stated that patient would like to have a Rx for Tramadol.  CB# is [phone].

## 2016-08-06 ENCOUNTER — Telehealth: Payer: Self-pay | Admitting: Physician Assistant

## 2016-08-06 ENCOUNTER — Ambulatory Visit: Payer: Self-pay | Admitting: Cardiology

## 2016-08-06 NOTE — Telephone Encounter (Signed)
Received records from [facility] for appointment on 08/11/16 with Rosaria Ferries, PA.  Records put with Rhonda's schedule for 08/11/16. lp

## 2016-08-11 ENCOUNTER — Encounter: Payer: Self-pay | Admitting: Physician Assistant

## 2016-08-11 ENCOUNTER — Other Ambulatory Visit: Payer: Self-pay

## 2016-08-11 ENCOUNTER — Ambulatory Visit (INDEPENDENT_AMBULATORY_CARE_PROVIDER_SITE_OTHER): Payer: Medicare HMO | Admitting: Physician Assistant

## 2016-08-11 VITALS — BP 106/70 | HR 60 | Ht 59.0 in | Wt 101.6 lb

## 2016-08-11 DIAGNOSIS — R55 Syncope and collapse: Secondary | ICD-10-CM

## 2016-08-11 DIAGNOSIS — I10 Essential (primary) hypertension: Secondary | ICD-10-CM

## 2016-08-11 DIAGNOSIS — I2510 Atherosclerotic heart disease of native coronary artery without angina pectoris: Secondary | ICD-10-CM | POA: Diagnosis not present

## 2016-08-11 DIAGNOSIS — R002 Palpitations: Secondary | ICD-10-CM

## 2016-08-11 NOTE — Progress Notes (Signed)
Cardiology Office Note   Date:  08/11/2016   ID:  Theresa Crosby, DOB 01/19/1933, MRN [id number]  PCP:  Dr Doretha Sou, [place], [place]  Cardiologist:  Dr Sallyanne Kuster 10/18/2015  Rosaria Ferries, PA-C   Chief Complaint  Patient presents with  . Follow-up    pt fell/ passed out.     History of Present Illness: Theresa Crosby is a 83 y.o. female with a history of CABG 09/2011 (LIMA to LAD, SVG-OM 2, SVG-diagonal, SVG-RCA), HTN, HLD, GERD  IM notes faxed over and reviewed. 05/11 pt had a syncopal episode and fell, broke L hip. S/p hip repair and d/c to [facility], went home on 06/02.   Theresa Crosby presents for cardiology evaluation  She has never had a syncopal episode before. No hx presyncope. On the day of admission, pt was in [place], except she got SOB walking and felt tired. She had eaten breakfast and had liquids. She was irritable. She limited her activity.   The syncopal episode happened later, she was getting more irritable and felt a little "fuzzy". She remembers clearly the events leading up to the syncope but had no warning that she was going to fall. No palpitations, no prodrome, no presyncope. She does not remember falling. She woke up on the floor, apparently only out a second or 2. She knew who and where she was. The pain in her hip was severe and she was very frightened. She wanted to pass out but did not, she believes that it was from the pain and fear.    She has had no episodes like it before or since. She sometimes has palpitations, gets rid of them with a deep breath.   She has had 3 other falls at the school, 2 were clearly mechanical and 1 is questionable.   She has not had CP with exertion or at rest. No LE edema except 2nd surgery, no orthopnea or PND.  No hx orthostatic sx.   Past Medical History:  Diagnosis Date  . Abnormal nuclear stress test 10/05/2011  . Anxiety    2005- related to care of aging parent     . Arthritis    degeneration - spondylosis, OA - all over body,   . Blood transfusion    postop- back surg. 1987  . Bunion   . CAD (coronary artery disease)significant diseas w/ 99% LAD, 80% D1, 80% LCX 10/05/2011  . GERD (gastroesophageal reflux disease)   . Hyperlipemia   . Hypertension    ireg. heartbeat, no referral made to cardiologist , sees [place]. Med.   . Neuromuscular disorder (HCC)    burning- L leg , numbing of toes   . S/P CABG x 4, LIMA-LAD, SVG-OM2, SVG-diag, SVG-RCA 10/05/2011  . Shortness of breath     Past Surgical History:  Procedure Laterality Date  . ABDOMINAL HYSTERECTOMY     partial hysterectomy- 1972  . ANTERIOR APPROACH HEMI HIP ARTHROPLASTY Left 06/26/2016   Procedure: ANTERIOR APPROACH TOTAL HIP ARTHROPLASTY;  Surgeon: Leandrew Koyanagi, MD;  Location: [facility];  Service: Orthopedics;  Laterality: Left;  . BACK SURGERY     1987-   . BREAST CYST ASPIRATION  06/18/2010  . CARDIAC CATHETERIZATION  10/12/2011   high-grade ca+ prox. LAD diagonal branch,higly diseased diagonal branch & obtuse marginal branch  . CORONARY ARTERY BYPASS GRAFT  10/04/2011   Procedure: CORONARY ARTERY BYPASS GRAFTING (CABG);  Surgeon: Gaye Pollack, MD;  Location[facility]  OR;  Service: Open Heart Surgery;  Laterality: N/A;  CABG x four using left internal mammary artery and right leg greater saphenous vein   . EYE SURGERY     cataracts removed- bilateral, no IOL  . FOOT SURGERY Right January 2013   HT 5  . LEFT HEART CATHETERIZATION WITH CORONARY ANGIOGRAM N/A 10/02/2011   Procedure: LEFT HEART CATHETERIZATION WITH CORONARY ANGIOGRAM;  Surgeon: Lorretta Harp, MD;  Location: [facility] CATH LAB;  Service: Cardiovascular;  Laterality: N/A;  . LUMBAR LAMINECTOMY/DECOMPRESSION MICRODISCECTOMY  03/04/2011   Procedure: LUMBAR LAMINECTOMY/DECOMPRESSION MICRODISCECTOMY;  Surgeon: Hosie Spangle, MD;  Location: [facility]URO ORS;  Service: Neurosurgery;  Laterality: N/A;  Thoracic Ten-Thoracic Twelve Thoracic  Laminectomy  . NM MYOVIEW LTD  09/15/2011   mild ischemia mid anterior & apical anterior region.EF: 86%  . TOE SURGERY     R foot- removed calus between 5-4  . TUBAL LIGATION      Medication Sig  . ADVAIR DISKUS 100-50 MCG/DOSE AEPB Inhale 1 puff into the lungs every 12 (twelve) hours as needed for wheezing.  Marland Kitchen albuterol (PROVENTIL HFA;VENTOLIN HFA) 108 (90 BASE) MCG/ACT inhaler Inhale 1-2 puffs into the lungs every 6 (six) hours as needed for wheezing or shortness of breath.  Marland Kitchen amLODipine (NORVASC) 5 MG tablet Take 5 mg by mouth daily.    Marland Kitchen aspirin EC 81 MG tablet Take 1 tablet (81 mg total) by mouth daily.  . Cholecalciferol (VITAMIN D3) 1000 UNITS CAPS Take 2,000 Units by mouth every morning.    . dorzolamide-timolol (COSOPT) 22.3-6.8 MG/ML ophthalmic solution Place 1 drop into both eyes 2 (two) times daily.  . fluticasone (FLONASE) 50 MCG/ACT nasal spray Place 2 sprays into the nose as needed for rhinitis.  Marland Kitchen latanoprost (XALATAN) 0.005 % ophthalmic solution Place 1 drop into both eyes at bedtime.    Marland Kitchen loratadine (CLARITIN) 10 MG tablet Take 10 mg by mouth daily as needed for allergies.  . magnesium oxide (MAG-OX) 400 MG tablet Take 400 mg by mouth daily.  . metoprolol tartrate (LOPRESSOR) 25 MG tablet Take 25 mg by mouth 2 (two) times daily.   . nitroGLYCERIN (NITROSTAT) 0.4 MG SL tablet Place 0.4 mg under the tongue every 5 (five) minutes as needed for chest pain.  . pantoprazole (PROTONIX) 40 MG tablet Take 40 mg by mouth daily.  . polyethylene glycol (MIRALAX) packet Take 17 g by mouth daily.  . Povidone-Iodine (BETADINE SWABSTICKS EX) Apply 1 application topically 2 (two) times daily.  . rosuvastatin (CRESTOR) 10 MG tablet Take 1 tablet (10 mg total) by mouth daily.  Marland Kitchen senna-docusate (SENOKOT-S) 8.6-50 MG tablet Take 1 tablet by mouth 2 (two) times daily.  . traMADol (ULTRAM) 50 MG tablet Take 1 tablet (50 mg total) by mouth daily as needed.   No current facility-administered  medications for this visit.     Allergies:   Aspirin and Lactose intolerance (gi)    Social History:  The patient  reports that she has never smoked. She has never used smokeless tobacco. She reports that she does not drink alcohol or use drugs.   Family History:  The patient's family history includes Heart attack in her brother, father, and mother; Hypertension in her father.    ROS:  Please see the history of present illness. All other systems are reviewed and negative.    PHYSICAL EXAM: VS:  BP 106/70   Pulse 60   Ht 4\' 11"  (1.499 m)   Wt 101 lb 9.6 oz (  46.1 kg)   BMI 20.52 kg/m  , BMI Body mass index is 20.52 kg/m. GEN: Well nourished, well developed, female in no acute distress  HEENT: normal for age  Neck: no JVD, no carotid bruit, no masses Cardiac: RRR; soft murmur, no rubs, or gallops Respiratory:  clear to auscultation bilaterally, normal work of breathing GI: soft, nontender, nondistended, + BS MS: no deformity or atrophy; no edema; distal pulses are 2+ in all 4 extremities   Skin: warm and dry, no rash Neuro:  Strength and sensation are intact Psych: euthymic mood, full affect   EKG:  EKG is ordered today. The ekg ordered today demonstrates sinus rhythm with sinus arrhythmia, heart rate 61 No significant change from 06/26/2016   Recent Labs: 06/26/2016: ALT 21 06/28/2016: BUN 14; Creatinine, Ser 0.81; Potassium 4.3; Sodium 135 06/29/2016: Hemoglobin 9.8; Platelets 141    Lipid Panel    Component Value Date/Time   CHOL 148 10/30/2015 1405   TRIG 59 10/30/2015 1405   HDL 77 10/30/2015 1405   CHOLHDL 1.9 10/30/2015 1405   VLDL 12 10/30/2015 1405   LDLCALC 59 10/30/2015 1405     Wt Readings from Last 3 Encounters:  08/11/16 101 lb 9.6 oz (46.1 kg)  07/03/16 107 lb 6.4 oz (48.7 kg)  06/26/16 102 lb 6.4 oz (46.4 kg)     Other studies Reviewed: Additional studies/ records that were reviewed today include: office notes, hospital records and  testing.  ASSESSMENT AND PLAN:  1.  Syncope, palpitations: Pt does not remember falling and woke up on the floor. Denies orthostatic sx, po intake normal that day. No palpitations that day, but has had in the past. She had DOE and fatigue, was irritable that day.  Will get event monitor and echo. F/u on the results.   2. CAD: No hx ischemic sx, continue ASA, BB, statin  3. HTN: BP under good control today   Current medicines are reviewed at length with the patient today.  The patient does not have concerns regarding medicines.  The following changes have been made:  no change  Labs/ tests ordered today include:  No orders of the defined types were placed in this encounter.    Disposition:   FU with Dr Sallyanne Kuster  Signed, Lenoard Aden  08/11/2016 10:21 AM    [place] Phone: [phone]; Fax: [phone]  This note was written with the assistance of speech recognition software. Please excuse any transcriptional errors.

## 2016-08-11 NOTE — Patient Instructions (Addendum)
Medication Instructions:  Your physician recommends that you continue on your current medications as directed. Please refer to the Current Medication list given to you today.  If you need a refill on your cardiac medications before your next appointment, please call your pharmacy.  HERE IN OUR OFFICE AT [facility]  Testing/Procedures: Your physician has requested that you have an echocardiogram. Echocardiography is a painless test that uses sound waves to create images of your heart. It provides your doctor with information about the size and shape of your heart and how well your heart's chambers and valves are working. This procedure takes approximately one hour. There are no restrictions for this procedure.  Your physician has recommended that you wear an event monitor. Event monitors are medical devices that record the heart's electrical activity. Doctors most often [place] these monitors to diagnose arrhythmias. Arrhythmias are problems with the speed or rhythm of the heartbeat. The monitor is a small, portable device. You can wear one while you do your normal daily activities. This is usually used to diagnose what is causing palpitations/syncope (passing out).  Follow-Up: Your physician wants you to follow-up in: [facility] CURRENT September APPT TO Sharia Reeve)  Thank you for choosing [facility] at [facility]!!

## 2016-08-17 ENCOUNTER — Ambulatory Visit (INDEPENDENT_AMBULATORY_CARE_PROVIDER_SITE_OTHER): Payer: Worker's Compensation

## 2016-08-17 ENCOUNTER — Encounter (INDEPENDENT_AMBULATORY_CARE_PROVIDER_SITE_OTHER): Payer: Self-pay | Admitting: Orthopaedic Surgery

## 2016-08-17 ENCOUNTER — Ambulatory Visit (INDEPENDENT_AMBULATORY_CARE_PROVIDER_SITE_OTHER): Payer: Medicare HMO | Admitting: Orthopaedic Surgery

## 2016-08-17 DIAGNOSIS — S72002A Fracture of unspecified part of neck of left femur, initial encounter for closed fracture: Secondary | ICD-10-CM

## 2016-08-17 NOTE — Progress Notes (Signed)
Theresa Crosby is 6 weeks status post left total hip replacement for femoral neck fracture. She complains of mild pain in her left hip. She continues to walk with a walker. She has quit her job. She still complains of her left leg being longer than the right. She did not get a shoe lift because of the cost. She is interested in a transferable orthotic or shoe lift that'll be more cost effective. She she has completed physical therapy. Left hip exam shows mild pain with hip range of motion. Positive Stinchfield sign. Lateral hip is slightly tender. Reassurances given that the implant looks stable. I think she is still mainly complaining of muscular weakness as she is only 6 weeks out. I gave her prescription to [facility] to see if there is another option to correct her leg length discrepancy. I'll like to see her back in 6 weeks for recheck. No x-rays needed.

## 2016-08-26 ENCOUNTER — Encounter: Payer: Self-pay | Admitting: Podiatry

## 2016-08-26 ENCOUNTER — Ambulatory Visit (INDEPENDENT_AMBULATORY_CARE_PROVIDER_SITE_OTHER): Payer: Medicare HMO

## 2016-08-26 ENCOUNTER — Ambulatory Visit (INDEPENDENT_AMBULATORY_CARE_PROVIDER_SITE_OTHER): Payer: Medicare HMO | Admitting: Podiatry

## 2016-08-26 ENCOUNTER — Ambulatory Visit (HOSPITAL_COMMUNITY): Payer: Medicare HMO | Attending: Cardiovascular Disease

## 2016-08-26 ENCOUNTER — Other Ambulatory Visit: Payer: Self-pay

## 2016-08-26 DIAGNOSIS — B351 Tinea unguium: Secondary | ICD-10-CM | POA: Diagnosis not present

## 2016-08-26 DIAGNOSIS — I088 Other rheumatic multiple valve diseases: Secondary | ICD-10-CM | POA: Diagnosis not present

## 2016-08-26 DIAGNOSIS — I2510 Atherosclerotic heart disease of native coronary artery without angina pectoris: Secondary | ICD-10-CM

## 2016-08-26 DIAGNOSIS — M79673 Pain in unspecified foot: Secondary | ICD-10-CM

## 2016-08-26 DIAGNOSIS — I5030 Unspecified diastolic (congestive) heart failure: Secondary | ICD-10-CM | POA: Insufficient documentation

## 2016-08-26 DIAGNOSIS — R55 Syncope and collapse: Secondary | ICD-10-CM

## 2016-08-26 DIAGNOSIS — I10 Essential (primary) hypertension: Secondary | ICD-10-CM | POA: Diagnosis not present

## 2016-08-26 DIAGNOSIS — M79606 Pain in leg, unspecified: Secondary | ICD-10-CM

## 2016-08-26 DIAGNOSIS — R002 Palpitations: Secondary | ICD-10-CM | POA: Diagnosis present

## 2016-08-26 NOTE — Patient Instructions (Addendum)
Seen for hypertrophic nails. Doing well since the left hip surgery. All nails debrided. Neuremedy dispensed x 2 bottles. Return in 3 months or as needed.

## 2016-08-26 NOTE — Progress Notes (Signed)
Subjective: 83 year old female presents complaining of painful thick toe nails on both feet.  Patient came in assisted by a walker. 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  Has had complete hip replacement in Jun 26, 2016. Still going through rehabilitation.  She was told that she had a black out.   Objective: Thick dystrophic nails x 10. Hard callused great toe at distal medial right. Mild left forefoot edema present. Pedal pulses are palpable. All epicritic and tactile sensations grossly intact.   Assessment: Onychomycosis x 10. Hammer toe deformity 2 right with digital corn and 4 left.  Plan: Debrided all nails and digital corn.  Return in 3 months or as needed

## 2016-08-31 ENCOUNTER — Telehealth (INDEPENDENT_AMBULATORY_CARE_PROVIDER_SITE_OTHER): Payer: Self-pay

## 2016-08-31 NOTE — Telephone Encounter (Signed)
Patient would like a call back, stated that she is having some pain in the pelvic area.  Would like to know what can be done or does she need to have more physical therapy?  Cb# is [phone].  Please advise.  Thank You.

## 2016-09-01 ENCOUNTER — Telehealth (INDEPENDENT_AMBULATORY_CARE_PROVIDER_SITE_OTHER): Payer: Self-pay | Admitting: Orthopaedic Surgery

## 2016-09-01 NOTE — Telephone Encounter (Signed)
Patient called advised the pain is in her left leg groin pain. Patient want to know what does Dr Erlinda Hong want her to do. Patient said she can't sit. The number to contact patient is [phone]

## 2016-09-01 NOTE — Telephone Encounter (Signed)
Where is the pelvic pain

## 2016-09-01 NOTE — Telephone Encounter (Signed)
Patient states she is having Groin pain and its radiating down Left leg

## 2016-09-01 NOTE — Telephone Encounter (Signed)
See message below  Please advise

## 2016-09-01 NOTE — Telephone Encounter (Signed)
She should come in to see [place] so I can evaluate her

## 2016-09-01 NOTE — Telephone Encounter (Signed)
See message below °

## 2016-09-02 ENCOUNTER — Telehealth (INDEPENDENT_AMBULATORY_CARE_PROVIDER_SITE_OTHER): Payer: Self-pay

## 2016-09-02 NOTE — Telephone Encounter (Signed)
Received fax request from [facility] requesting records on this patient for a WC claim. DOI 06/26/16. Claim#[id number]. Emailed notes to adjustor per her request and asked her to let me know if claim is just under investigation or if it has been accepted so we will know to correct billing info.

## 2016-09-02 NOTE — Telephone Encounter (Signed)
IC appt made

## 2016-09-07 ENCOUNTER — Ambulatory Visit (INDEPENDENT_AMBULATORY_CARE_PROVIDER_SITE_OTHER): Payer: Medicare HMO | Admitting: Orthopaedic Surgery

## 2016-09-07 DIAGNOSIS — S72002A Fracture of unspecified part of neck of left femur, initial encounter for closed fracture: Secondary | ICD-10-CM

## 2016-09-07 MED ORDER — METHOCARBAMOL 500 MG PO TABS: 500.0000 mg | ORAL_TABLET | Freq: Four times a day (QID) | ORAL | 2 refills | Status: DC | PRN

## 2016-09-07 NOTE — Progress Notes (Signed)
Patient is a 78 day status post left total hip replacement for femoral neck fracture. She comes back today for follow-up. She has been complaining of left groin pain has improved. She has an appointment with [facility] for a shoe lift. She states her pain comes and goes. She is now living at home by herself and she is essentially independent with all of her ADLs. She walks with a cane. Overall she is doing better. I think she has done very well and progressing appropriately as expected. I think she has expected to recover faster but I reminded her that she had a major injury with a surgery and she is 83 years old. From my standpoint I think she is doing fine. I'll see her back in 2 months with standing AP pelvis.

## 2016-09-11 ENCOUNTER — Telehealth (INDEPENDENT_AMBULATORY_CARE_PROVIDER_SITE_OTHER): Payer: Self-pay | Admitting: Orthopaedic Surgery

## 2016-09-11 ENCOUNTER — Telehealth (INDEPENDENT_AMBULATORY_CARE_PROVIDER_SITE_OTHER): Payer: Self-pay

## 2016-09-11 DIAGNOSIS — S72002A Fracture of unspecified part of neck of left femur, initial encounter for closed fracture: Secondary | ICD-10-CM

## 2016-09-11 NOTE — Telephone Encounter (Signed)
Faxed the 09/07/16 office note to [facility] per her request

## 2016-09-11 NOTE — Telephone Encounter (Signed)
Pt called states she has been taking the medicine for spasms but it is not working. Pt states the last two nights have been horrible pain. Pt wonders if going to PT will help so she can be sure she is doing exercises correctly. Please call pt.

## 2016-09-14 NOTE — Telephone Encounter (Signed)
I think she needs a ilipsoas injection with Dr. Ernestina Patches. Don't think PT would necessairly help

## 2016-09-14 NOTE — Addendum Note (Signed)
Addended by: Precious Bard on: 09/14/2016 11:57 AM   Modules accepted: Orders

## 2016-09-14 NOTE — Telephone Encounter (Signed)
See message below °

## 2016-09-14 NOTE — Telephone Encounter (Signed)
Called patient to advise . She is aware someone will call her to schedule appt for inj

## 2016-09-16 ENCOUNTER — Telehealth (INDEPENDENT_AMBULATORY_CARE_PROVIDER_SITE_OTHER): Payer: Self-pay

## 2016-09-16 NOTE — Telephone Encounter (Signed)
We can give her norco #[phone] if she wants

## 2016-09-16 NOTE — Telephone Encounter (Signed)
Sherrie from [employer] called and states patient has been calling her several times saying that she is having pain and has been taking Tylenol/Robaxin and not helping. She has not been sleeping well. Per Sherrie she said patient does not want the injection anymore.

## 2016-09-17 MED ORDER — HYDROCODONE-ACETAMINOPHEN 5-325 MG PO TABS: 1.0000 | ORAL_TABLET | Freq: Two times a day (BID) | ORAL | 0 refills | Status: DC | PRN

## 2016-09-17 NOTE — Addendum Note (Signed)
Addended by: Precious Bard on: 09/17/2016 11:03 AM   Modules accepted: Orders

## 2016-09-17 NOTE — Telephone Encounter (Signed)
Rx ready for pick up here in our office.

## 2016-09-18 ENCOUNTER — Ambulatory Visit (INDEPENDENT_AMBULATORY_CARE_PROVIDER_SITE_OTHER): Payer: Medicare HMO | Admitting: Cardiovascular Disease

## 2016-09-18 ENCOUNTER — Encounter: Payer: Self-pay | Admitting: Cardiovascular Disease

## 2016-09-18 VITALS — BP 154/89 | HR 65 | Ht 59.0 in | Wt 101.8 lb

## 2016-09-18 DIAGNOSIS — I2510 Atherosclerotic heart disease of native coronary artery without angina pectoris: Secondary | ICD-10-CM | POA: Diagnosis not present

## 2016-09-18 DIAGNOSIS — I10 Essential (primary) hypertension: Secondary | ICD-10-CM

## 2016-09-18 DIAGNOSIS — E785 Hyperlipidemia, unspecified: Secondary | ICD-10-CM

## 2016-09-18 NOTE — Progress Notes (Signed)
Cardiology Office Note    Date:  09/18/2016   ID:  Theresa Crosby, DOB 11-Jan-1933, MRN [id number]  PCP:  Doretha Sou, MD  Cardiologist:   Sanda Klein, MD   Chief Complaint  Patient presents with  . Follow-up    pt denied chest pain and SOB, pt c/o swelling in left ankle    History of Present Illness:  Theresa Crosby is a 83 y.o. female with coronary artery disease status post bypass surgery in 2013([place], LIMA to LAD, SVG-OM 2, SVG-diagonal, SVG-RCA , August 2013), hypertension and hyperlipidemia returning for follow-up.   She had a fall and required left total hip replacement. She has retired since then. She still complains of pain in her left hip and believes that her legs are not equal in length. She would like a second opinion from another orthopedic surgeon. She still has stinging pain and tenderness at the keloid scar at the site of her sternotomy. She has seen 2 different dermatologists left performed injections of the scar without any benefit, actually with development of new keloid.  She denies angina or dyspnea at rest or with exertion and has not had palpitations, edema, orthopnea, PND, claudication, leg edema or neurological planes.   Her hip fracture was apparently preceded by syncope, we performed an event monitor and an echocardiogram. Left ventricular systolic function was normal and so far there is no significant arrhythmia. She has not had recurrent syncope.  She is no longer taking a statin. She believes that I recommended it to be stopped, but we had only discussed a temporary "statin holiday".  She has occasional problems with back pain (has undergone lumbar spine stenosis in 1985 and 2013).    Past Medical History:  Diagnosis Date  . Abnormal nuclear stress test 10/05/2011  . Anxiety    2005- related to care of aging parent   . Arthritis    degeneration - spondylosis, OA - all over body,   . Blood transfusion    postop- back surg. 1987   . Bunion   . CAD (coronary artery disease)significant diseas w/ 99% LAD, 80% D1, 80% LCX 10/05/2011  . GERD (gastroesophageal reflux disease)   . Hyperlipemia   . Hypertension    ireg. heartbeat, no referral made to cardiologist , sees [facility]. Med.   . Neuromuscular disorder (HCC)    burning- L leg , numbing of toes   . S/P CABG x 4, LIMA-LAD, SVG-OM2, SVG-diag, SVG-RCA 10/05/2011  . Shortness of breath     Past Surgical History:  Procedure Laterality Date  . ABDOMINAL HYSTERECTOMY     partial hysterectomy- 1972  . ANTERIOR APPROACH HEMI HIP ARTHROPLASTY Left 06/26/2016   Procedure: ANTERIOR APPROACH TOTAL HIP ARTHROPLASTY;  Surgeon: Leandrew Koyanagi, MD;  Location: [facility];  Service: Orthopedics;  Laterality: Left;  . BACK SURGERY     1987-   . BREAST CYST ASPIRATION  06/18/2010  . CARDIAC CATHETERIZATION  10/12/2011   high-grade ca+ prox. LAD diagonal branch,higly diseased diagonal branch & obtuse marginal branch  . CORONARY ARTERY BYPASS GRAFT  10/04/2011   Procedure: CORONARY ARTERY BYPASS GRAFTING (CABG);  Surgeon: Gaye Pollack, MD;  Location: [facility];  Service: Open Heart Surgery;  Laterality: N/A;  CABG x four using left internal mammary artery and right leg greater saphenous vein   . EYE SURGERY     cataracts removed- bilateral, no IOL  . FOOT SURGERY Right January 2013   HT 5  . LEFT HEART  CATHETERIZATION WITH CORONARY ANGIOGRAM N/A 10/02/2011   Procedure: LEFT HEART CATHETERIZATION WITH CORONARY ANGIOGRAM;  Surgeon: Lorretta Harp, MD;  Location: [facility] CATH LAB;  Service: Cardiovascular;  Laterality: N/A;  . LUMBAR LAMINECTOMY/DECOMPRESSION MICRODISCECTOMY  03/04/2011   Procedure: LUMBAR LAMINECTOMY/DECOMPRESSION MICRODISCECTOMY;  Surgeon: Hosie Spangle, MD;  Location: [facility] NEUR[facility] ORS;  Service: Neurosurgery;  Laterality: N/A;  Thoracic Ten-Thoracic Twelve Thoracic Laminectomy  . NM MYOVIEW LTD  09/15/2011   mild ischemia mid anterior & apical anterior region.EF: 86%  . TOE SURGERY      R foot- removed calus between 5-4  . TUBAL LIGATION      Current Medications: Outpatient Medications Prior to Visit  Medication Sig Dispense Refill  . ADVAIR DISKUS 100-50 MCG/DOSE AEPB Inhale 1 puff into the lungs every 12 (twelve) hours as needed for wheezing.  2  . albuterol (PROVENTIL HFA;VENTOLIN HFA) 108 (90 BASE) MCG/ACT inhaler Inhale 1-2 puffs into the lungs every 6 (six) hours as needed for wheezing or shortness of breath.    Marland Kitchen amLODipine (NORVASC) 5 MG tablet Take 5 mg by mouth daily.      Marland Kitchen aspirin EC 81 MG tablet Take 1 tablet (81 mg total) by mouth daily. 100 tablet 0  . Cholecalciferol (VITAMIN D3) 1000 UNITS CAPS Take 2,000 Units by mouth every morning.      . dorzolamide-timolol (COSOPT) 22.3-6.8 MG/ML ophthalmic solution Place 1 drop into both eyes 2 (two) times daily.  5  . fluticasone (FLONASE) 50 MCG/ACT nasal spray Place 2 sprays into the nose as needed for rhinitis.    Marland Kitchen HYDROcodone-acetaminophen (NORCO/VICODIN) 5-325 MG tablet Take 1 tablet by mouth 2 (two) times daily as needed for moderate pain. 30 tablet 0  . latanoprost (XALATAN) 0.005 % ophthalmic solution Place 1 drop into both eyes at bedtime.      Marland Kitchen loratadine (CLARITIN) 10 MG tablet Take 10 mg by mouth daily as needed for allergies.    . magnesium oxide (MAG-OX) 400 MG tablet Take 400 mg by mouth daily.    . methocarbamol (ROBAXIN) 500 MG tablet Take 1 tablet (500 mg total) by mouth every 6 (six) hours as needed for muscle spasms. 30 tablet 2  . metoprolol tartrate (LOPRESSOR) 25 MG tablet Take 25 mg by mouth 2 (two) times daily.     . traMADol (ULTRAM) 50 MG tablet Take 1 tablet (50 mg total) by mouth daily as needed. 30 tablet 0  . pantoprazole (PROTONIX) 40 MG tablet Take 40 mg by mouth daily.     No facility-administered medications prior to visit.      Allergies:   Aspirin and Lactose intolerance (gi)   Social History   Social History  . Marital status: Widowed    Spouse name: N/A  . Number of  children: N/A  . Years of education: N/A   Occupational History  . Was working at [employer] Topics  . Smoking status: Never Smoker  . Smokeless tobacco: Never Used  . Alcohol use No  . Drug use: No  . Sexual activity: Not on file   Other Topics Concern  . Not on file   Social History Narrative   Lives alone in an apartment.      Family History:  The patient's \ family history includes Heart attack in her brother, father, and mother; Hypertension in her father.   ROS:   Please see the history of present illness.    ROS All other  systems reviewed and are negative.   PHYSICAL EXAM:   VS:  BP (!) 154/89   Pulse 65   Ht 4\' 11"  (1.499 m)   Wt 101 lb 12.8 oz (46.2 kg)   BMI 20.56 kg/m     General: Alert, oriented x3; Well nourished, well developed, in no acute distress  Head: no evidence of trauma, PERRL, EOMI, no exophtalmos or lid lag, no myxedema, no xanthelasma; normal ears, nose and oropharynx Neck: normal jugular venous pulsations and no hepatojugular reflux; brisk carotid pulses without delay and no carotid bruits Chest: clear to auscultation, no signs of consolidation by percussion or palpation, normal fremitus, symmetrical and full respiratory excursions Cardiovascular: normal position and quality of the apical impulse, regular rhythm, normal first and second heart sounds, no murmurs, rubs or gallops Abdomen: no tenderness or distention, no masses by palpation, no abnormal pulsatility or arterial bruits, normal bowel sounds, no hepatosplenomegaly Extremities: no clubbing, cyanosis or edema; 2+ radial, ulnar and brachial pulses bilaterally; 2+ right femoral, posterior tibial and dorsalis pedis pulses; 2+ left femoral, posterior tibial and dorsalis pedis pulses; no subclavian or femoral bruits Neurological: grossly nonfocal Psych: euthymic mood, full affect  Wt Readings from Last 3 Encounters:  09/18/16 101 lb 12.8 oz (46.2 kg)    08/11/16 101 lb 9.6 oz (46.1 kg)  07/03/16 107 lb 6.4 oz (48.7 kg)      Studies/Labs Reviewed:   EKG:  EKG is  ordered today.  The ekg ordered today demonstrates Sinus rhythm transitioning to ectopic atrial rhythm with normal rate in the 70s, nonspecific T-wave changes, QTc 457 ms  Recent Labs: 06/26/2016: ALT 21 06/28/2016: BUN 14; Creatinine, Ser 0.81; Potassium 4.3; Sodium 135 06/29/2016: Hemoglobin 9.8; Platelets 141   Lipid Panel    Component Value Date/Time   CHOL 148 10/30/2015 1405   TRIG 59 10/30/2015 1405   HDL 77 10/30/2015 1405   CHOLHDL 1.9 10/30/2015 1405   VLDL 12 10/30/2015 1405   LDLCALC 59 10/30/2015 1405     ASSESSMENT:    1. Coronary artery disease involving native coronary artery of native heart without angina pectoris   2. Dyslipidemia   3. Essential hypertension      PLAN:  In order of problems listed above:  1. CAD: Feels well, no symptoms of angina pectoris, normal left ventricular systolic function, risk factors generally well addressed. 2. HLP: Off statin, recheck labs. Her "statin holiday" has been going on for a long time now. She has multiple musculoskeletal complaints now, after being off the medication for a long time. I suspect her myalgias and arthralgias were not related to the medication anyway. Target LDL under 70. 3. HTN: Blood pressure was elevated when she first checked in, when rechecked was 141/70. At home her blood pressure was 129/68 mmHg this morning. No changes in medications recommended.    Medication Adjustments/Labs and Tests Ordered: Current medicines are reviewed at length with the patient today.  Concerns regarding medicines are outlined above.  Medication changes, Labs and Tests ordered today are listed in the Patient Instructions below. Patient Instructions  Dr Sallyanne Kuster recommends that you continue on your current medications as directed. Please refer to the Current Medication list given to you today.  Your  physician recommends that you return for lab work at your convenience - FASTING.  Dr Sallyanne Kuster recommends that you schedule a follow-up appointment in 12 months. You will receive a reminder letter in the mail two months in advance. If you don't receive a  letter, please call our office to schedule the follow-up appointment.  If you need a refill on your cardiac medications before your next appointment, please call your pharmacy.    Signed, Sanda Klein, MD  09/18/2016 6:31 PM    [facility] [address], [place], [place]  [address] Phone: [phone]; Fax: [phone]

## 2016-09-18 NOTE — Patient Instructions (Signed)
Dr Croitoru recommends that you continue on your current medications as directed. Please refer to the Current Medication list given to you today.  Your physician recommends that you return for lab work at your convenience - FASTING.  Dr Croitoru recommends that you schedule a follow-up appointment in 12 months. You will receive a reminder letter in the mail two months in advance. If you don't receive a letter, please call our office to schedule the follow-up appointment.  If you need a refill on your cardiac medications before your next appointment, please call your pharmacy. 

## 2016-09-21 ENCOUNTER — Ambulatory Visit (INDEPENDENT_AMBULATORY_CARE_PROVIDER_SITE_OTHER): Payer: Self-pay | Admitting: Orthopaedic Surgery

## 2016-09-22 LAB — LIPID PANEL
Chol/HDL Ratio: 2.1 ratio (ref 0.0–4.4)
Cholesterol, Total: 165 mg/dL (ref 100–199)
HDL: 79 mg/dL (ref >39)
LDL Calculated: 72 mg/dL (ref 0–99)
Triglycerides: 70 mg/dL (ref 0–149)
VLDL Cholesterol Cal: 14 mg/dL (ref 5–40)

## 2016-09-23 ENCOUNTER — Telehealth: Payer: Self-pay | Admitting: Cardiovascular Disease

## 2016-09-23 NOTE — Telephone Encounter (Signed)
New message   Patient calling back to speak with nurse regarding her messages she left on the voice mail.

## 2016-09-23 NOTE — Telephone Encounter (Signed)
Cholesterol reviewed with patient

## 2016-09-24 ENCOUNTER — Telehealth (INDEPENDENT_AMBULATORY_CARE_PROVIDER_SITE_OTHER): Payer: Self-pay

## 2016-09-24 NOTE — Telephone Encounter (Signed)
Please advise 

## 2016-09-24 NOTE — Telephone Encounter (Signed)
[name] case manager called stating that patient pain medicine is making her sick and would like to know if another Rx can be prescribed.  Currently taking Tylenol, but it is not helping.  Cb# [phone].  Please advise. Thank You.

## 2016-09-24 NOTE — Telephone Encounter (Signed)
Made in error

## 2016-09-24 NOTE — Telephone Encounter (Signed)
Tramadol #30

## 2016-09-25 NOTE — Telephone Encounter (Signed)
Does not want anything. Would like to come in to her next OV first.

## 2016-09-25 NOTE — Telephone Encounter (Signed)
Tylenol #3. #20

## 2016-09-25 NOTE — Telephone Encounter (Signed)
FYI:  Called patient to advise and she states Tramadol does not help and DOES NOT want anything prescribe until she comes in for her appt on Friday 8/17

## 2016-09-28 ENCOUNTER — Telehealth (INDEPENDENT_AMBULATORY_CARE_PROVIDER_SITE_OTHER): Payer: Self-pay | Admitting: Orthopaedic Surgery

## 2016-09-28 NOTE — Telephone Encounter (Signed)
Patient's son would like for Dr. Erlinda Hong to call him.  CB#[phone].  Thank you.

## 2016-09-28 NOTE — Telephone Encounter (Signed)
Called Son and Spoke to him advised him that Dr Erlinda Hong is out of the office until friday

## 2016-10-02 ENCOUNTER — Ambulatory Visit (INDEPENDENT_AMBULATORY_CARE_PROVIDER_SITE_OTHER): Payer: Worker's Compensation

## 2016-10-02 ENCOUNTER — Ambulatory Visit (INDEPENDENT_AMBULATORY_CARE_PROVIDER_SITE_OTHER): Payer: Worker's Compensation | Admitting: Orthopaedic Surgery

## 2016-10-02 DIAGNOSIS — S72002A Fracture of unspecified part of neck of left femur, initial encounter for closed fracture: Secondary | ICD-10-CM

## 2016-10-02 DIAGNOSIS — M7062 Trochanteric bursitis, left hip: Secondary | ICD-10-CM | POA: Diagnosis not present

## 2016-10-02 MED ORDER — BUPIVACAINE HCL 0.5 % IJ SOLN
3.0000 mL | INTRAMUSCULAR | Status: AC | PRN
  Administered 2016-10-02: 3 mL via INTRA_ARTICULAR

## 2016-10-02 MED ORDER — LIDOCAINE HCL 1 % IJ SOLN
3.0000 mL | INTRAMUSCULAR | Status: AC | PRN
  Administered 2016-10-02: 3 mL

## 2016-10-02 MED ORDER — METHYLPREDNISOLONE ACETATE 40 MG/ML IJ SUSP
40.0000 mg | INTRAMUSCULAR | Status: AC | PRN
  Administered 2016-10-02: 40 mg via INTRA_ARTICULAR

## 2016-10-02 NOTE — Progress Notes (Signed)
Office Visit Note   Patient: Theresa Crosby           Date of Birth: 1933-02-12           MRN: [id number] Visit Date: 10/02/2016              Requested by: Doretha Sou, MD [address] [place], [place] [address] PCP: Doretha Sou, MD   Assessment & Plan: Visit Diagnoses:  1. Left displaced femoral neck fracture ([place])   2. Trochanteric bursitis, left hip     Plan: Overall impression is trochanteric bursitis. Injection was performed today. Patient tolerates well. Recommended physical therapy with modalities as an outpatient. Recommend that she see her primary care doctor OB/GYN regarding the incontinence. She is compensating well from her leg length discrepancy. Follow-up in 2 months for recheck. Total face to face encounter time was greater than 25 minutes and over half of this time was spent in counseling and/or coordination of care.  Follow-Up Instructions: Return in about 2 months (around 12/02/2016).   Orders:  Orders Placed This Encounter  Procedures  . XR Pelvis 1-2 Views   No orders of the defined types were placed in this encounter.     Procedures: Large Joint Inj Date/Time: 10/02/2016 11:15 AM Performed by: Leandrew Koyanagi Authorized by: Leandrew Koyanagi   Consent Given by:  Patient Timeout: prior to procedure the correct patient, procedure, and site was verified   Indications:  Pain Location:  Hip Site:  L greater trochanter Prep: patient was prepped and draped in usual sterile fashion   Needle Size:  22 G Approach:  Lateral Ultrasound Guidance: No   Fluoroscopic Guidance: No   Arthrogram: No   Medications:  3 mL lidocaine 1 %; 3 mL bupivacaine 0.5 %; 40 mg methylPREDNISolone acetate 40 MG/ML     Clinical Data: No additional findings.   Subjective: Chief Complaint  Patient presents with  . Left Hip - Pain, Follow-up    Patient is 98 days status post left total hip replacement. She does home exercise. She is mainly  complaining of significant pain on the lateral aspect of her hip is worse with lying down. She is currently wearing a shoe insert on the contralateral leg which has helped. She is ambulate with a cane. She is also having some bladder incontinence. She seems to be frustrated by the fact that she is having increasing issues ever since her hip fracture.    Review of Systems  Constitutional: Negative.   HENT: Negative.   Eyes: Negative.   Respiratory: Negative.   Cardiovascular: Negative.   Endocrine: Negative.   Musculoskeletal: Negative.   Neurological: Negative.   Hematological: Negative.   Psychiatric/Behavioral: Negative.   All other systems reviewed and are negative.    Objective: Vital Signs: There were no vitals taken for this visit.  Physical Exam  Constitutional: She is oriented to person, place, and time. She appears well-developed and well-nourished.  Pulmonary/Chest: Effort normal.  Neurological: She is alert and oriented to person, place, and time.  Skin: Skin is warm. Capillary refill takes less than 2 seconds.  Psychiatric: She has a normal mood and affect. Her behavior is normal. Judgment and thought content normal.  Nursing note and vitals reviewed.   Ortho Exam Left hip exam shows a fully healed surgical scar. She is very tender over the trochanteric bursa. Specialty Comments:  No specialty comments available.  Imaging: No results found.   PMFS History: Patient Active Problem List  Diagnosis Date Noted  . Left displaced femoral neck fracture ([place]) 06/26/2016  . Hypertension, essential 06/26/2016  . HLD (hyperlipidemia) 06/26/2016  . Coronary heart disease 06/26/2016  . Left ventricular diastolic dysfunction 09/60/4540  . Asthma 06/26/2016  . Left shoulder pain 12/10/2014  . Pain in toe of left foot 02/13/2014  . Pain in lower limb 12/18/2013  . Ingrown nail 11/30/2012  . Pain in joint, ankle and foot 08/30/2012  . Onychomycosis due to dermatophyte  05/30/2012  . Chest pain 11/02/2011  . Oral thrush 11/02/2011  . Neuropathy, lower extremities 10/07/2011  . HTN (hypertension) 10/07/2011  . Dyslipidemia 10/07/2011  . Chest pain at rest associated with dyspnea 10/05/2011  . Abnormal nuclear stress test 10/05/2011  . CAD s/p CABG x 4 2013 10/05/2011  . S/P CABG x 4, LIMA-LAD, SVG-OM2, SVG-diag, SVG-RCA  10/04/11 10/05/2011   Past Medical History:  Diagnosis Date  . Abnormal nuclear stress test 10/05/2011  . Anxiety    2005- related to care of aging parent   . Arthritis    degeneration - spondylosis, OA - all over body,   . Blood transfusion    postop- back surg. 1987  . Bunion   . CAD (coronary artery disease)significant diseas w/ 99% LAD, 80% D1, 80% LCX 10/05/2011  . GERD (gastroesophageal reflux disease)   . Hyperlipemia   . Hypertension    ireg. heartbeat, no referral made to cardiologist , sees [facility]. Med.   . Neuromuscular disorder (HCC)    burning- L leg , numbing of toes   . S/P CABG x 4, LIMA-LAD, SVG-OM2, SVG-diag, SVG-RCA 10/05/2011  . Shortness of breath     Family History  Problem Relation Age of Onset  . Heart attack Mother   . Heart attack Father   . Hypertension Father   . Heart attack Brother   . Anesthesia problems Neg Hx   . Hypotension Neg Hx   . Malignant hyperthermia Neg Hx   . Pseudochol deficiency Neg Hx     Past Surgical History:  Procedure Laterality Date  . ABDOMINAL HYSTERECTOMY     partial hysterectomy- 1972  . ANTERIOR APPROACH HEMI HIP ARTHROPLASTY Left 06/26/2016   Procedure: ANTERIOR APPROACH TOTAL HIP ARTHROPLASTY;  Surgeon: Leandrew Koyanagi, MD;  Location: [facility];  Service: Orthopedics;  Laterality: Left;  . BACK SURGERY     1987-   . BREAST CYST ASPIRATION  06/18/2010  . CARDIAC CATHETERIZATION  10/12/2011   high-grade ca+ prox. LAD diagonal branch,higly diseased diagonal branch & obtuse marginal branch  . CORONARY ARTERY BYPASS GRAFT  10/04/2011   Procedure: CORONARY ARTERY BYPASS  GRAFTING (CABG);  Surgeon: Gaye Pollack, MD;  Location: [facility];  Service: Open Heart Surgery;  Laterality: N/A;  CABG x four using left internal mammary artery and right leg greater saphenous vein   . EYE SURGERY     cataracts removed- bilateral, no IOL  . FOOT SURGERY Right January 2013   HT 5  . LEFT HEART CATHETERIZATION WITH CORONARY ANGIOGRAM N/A 10/02/2011   Procedure: LEFT HEART CATHETERIZATION WITH CORONARY ANGIOGRAM;  Surgeon: Lorretta Harp, MD;  Location: [facility] CATH LAB;  Service: Cardiovascular;  Laterality: N/A;  . LUMBAR LAMINECTOMY/DECOMPRESSION MICRODISCECTOMY  03/04/2011   Procedure: LUMBAR LAMINECTOMY/DECOMPRESSION MICRODISCECTOMY;  Surgeon: Hosie Spangle, MD;  Location: [facility] NEURO ORS;  Service: Neurosurgery;  Laterality: N/A;  Thoracic Ten-Thoracic Twelve Thoracic Laminectomy  . NM MYOVIEW LTD  09/15/2011   mild ischemia mid anterior & apical anterior region.EF:  86%  . TOE SURGERY     R foot- removed calus between 5-4  . TUBAL LIGATION     Social History   Occupational History  . Was working at [employer] Topics  . Smoking status: Never Smoker  . Smokeless tobacco: Never Used  . Alcohol use No  . Drug use: No  . Sexual activity: Not on file

## 2016-10-05 ENCOUNTER — Telehealth (INDEPENDENT_AMBULATORY_CARE_PROVIDER_SITE_OTHER): Payer: Self-pay | Admitting: Orthopaedic Surgery

## 2016-10-05 NOTE — Telephone Encounter (Signed)
Tedra Coupe from [facility] called asking for the surgery notes and the order for PT to be faxed over to their office at [phone]

## 2016-10-05 NOTE — Telephone Encounter (Signed)
Faxed to [phone] 

## 2016-10-06 ENCOUNTER — Telehealth (INDEPENDENT_AMBULATORY_CARE_PROVIDER_SITE_OTHER): Payer: Self-pay

## 2016-10-06 NOTE — Telephone Encounter (Signed)
Faxed order will be sent for scanning

## 2016-10-06 NOTE — Telephone Encounter (Signed)
Received vm from [place] @ [facility] needing PT rx faxed to them and I didn't see one in chart. Please fax to her @ [phone]

## 2016-10-07 ENCOUNTER — Telehealth (INDEPENDENT_AMBULATORY_CARE_PROVIDER_SITE_OTHER): Payer: Self-pay

## 2016-10-07 NOTE — Telephone Encounter (Signed)
Patient will come in to sign medical release form to have records faxed to another office.

## 2016-10-20 ENCOUNTER — Telehealth (INDEPENDENT_AMBULATORY_CARE_PROVIDER_SITE_OTHER): Payer: Self-pay | Admitting: Orthopaedic Surgery

## 2016-10-20 NOTE — Telephone Encounter (Signed)
REQUEST FOR RECORDS PROCESSED BY [facility]. PATIENT CALLED STATING [facility] DID NOT [facility] NOTE. I FAXED [phone]

## 2016-11-09 ENCOUNTER — Ambulatory Visit (INDEPENDENT_AMBULATORY_CARE_PROVIDER_SITE_OTHER): Payer: Worker's Compensation | Admitting: Orthopaedic Surgery

## 2016-11-09 ENCOUNTER — Encounter (INDEPENDENT_AMBULATORY_CARE_PROVIDER_SITE_OTHER): Payer: Self-pay | Admitting: Orthopaedic Surgery

## 2016-11-09 DIAGNOSIS — S72002A Fracture of unspecified part of neck of left femur, initial encounter for closed fracture: Secondary | ICD-10-CM

## 2016-11-09 NOTE — Progress Notes (Signed)
Office Visit Note   Patient: Theresa Crosby           Date of Birth: 05-Jul-1932           MRN: [id number] Visit Date: 11/09/2016              Requested by: Doretha Sou, MD [address] [place], [place] [address] PCP: Doretha Sou, MD   Assessment & Plan: Visit Diagnoses:  1. Left displaced femoral neck fracture (HCC)     Plan: Patient is 4 months status post left total hip replacement for femoral neck fracture. She is getting much better now. I recommend continue with physical therapy. Follow-up in 6 months with repeat standing AP pelvis.  Follow-Up Instructions: Return in about 6 months (around 05/09/2017).   Orders:  No orders of the defined types were placed in this encounter.  No orders of the defined types were placed in this encounter.     Procedures: No procedures performed   Clinical Data: No additional findings.   Subjective: Chief Complaint  Patient presents with  . Left Hip - Pain, Follow-up    Patient is 4 months status post left total hip replacement. She is doing much better. She is doing physical therapy twice a week. She no longer complains of the left groin pain. She's not taking any pain medicines. She is ambulate with a cane.    Review of Systems   Objective: Vital Signs: There were no vitals taken for this visit.  Physical Exam  Ortho Exam Left hip exam shows a fully healed surgical scar. Ambulates with a cane. Painless range of motion of the hip. Specialty Comments:  No specialty comments available.  Imaging: No results found.   PMFS History: Patient Active Problem List   Diagnosis Date Noted  . Left displaced femoral neck fracture ([place]) 06/26/2016  . Hypertension, essential 06/26/2016  . HLD (hyperlipidemia) 06/26/2016  . Coronary heart disease 06/26/2016  . Left ventricular diastolic dysfunction 75/11/2583  . Asthma 06/26/2016  . Left shoulder pain 12/10/2014  . Pain in toe of left foot  02/13/2014  . Pain in lower limb 12/18/2013  . Ingrown nail 11/30/2012  . Pain in joint, ankle and foot 08/30/2012  . Onychomycosis due to dermatophyte 05/30/2012  . Chest pain 11/02/2011  . Oral thrush 11/02/2011  . Neuropathy, lower extremities 10/07/2011  . HTN (hypertension) 10/07/2011  . Dyslipidemia 10/07/2011  . Chest pain at rest associated with dyspnea 10/05/2011  . Abnormal nuclear stress test 10/05/2011  . CAD s/p CABG x 4 2013 10/05/2011  . S/P CABG x 4, LIMA-LAD, SVG-OM2, SVG-diag, SVG-RCA  10/04/11 10/05/2011   Past Medical History:  Diagnosis Date  . Abnormal nuclear stress test 10/05/2011  . Anxiety    2005- related to care of aging parent   . Arthritis    degeneration - spondylosis, OA - all over body,   . Blood transfusion    postop- back surg. 1987  . Bunion   . CAD (coronary artery disease)significant diseas w/ 99% LAD, 80% D1, 80% LCX 10/05/2011  . GERD (gastroesophageal reflux disease)   . Hyperlipemia   . Hypertension    ireg. heartbeat, no referral made to cardiologist , sees [facility].   . Neuromuscular disorder (HCC)    burning- L leg , numbing of toes   . S/P CABG x 4, LIMA-LAD, SVG-OM2, SVG-diag, SVG-RCA 10/05/2011  . Shortness of breath     Family History  Problem Relation Age of Onset  .  Heart attack Mother   . Heart attack Father   . Hypertension Father   . Heart attack Brother   . Anesthesia problems Neg Hx   . Hypotension Neg Hx   . Malignant hyperthermia Neg Hx   . Pseudochol deficiency Neg Hx     Past Surgical History:  Procedure Laterality Date  . ABDOMINAL HYSTERECTOMY     partial hysterectomy- 1972  . ANTERIOR APPROACH HEMI HIP ARTHROPLASTY Left 06/26/2016   Procedure: ANTERIOR APPROACH TOTAL HIP ARTHROPLASTY;  Surgeon: Leandrew Koyanagi, MD;  Location: [facility];  Service: Orthopedics;  Laterality: Left;  . BACK SURGERY     1987-   . BREAST CYST ASPIRATION  06/18/2010  . CARDIAC CATHETERIZATION  10/12/2011   high-grade ca+ prox. LAD  diagonal branch,higly diseased diagonal branch & obtuse marginal branch  . CORONARY ARTERY BYPASS GRAFT  10/04/2011   Procedure: CORONARY ARTERY BYPASS GRAFTING (CABG);  Surgeon: Gaye Pollack, MD;  Location: [facility];  Service: Open Heart Surgery;  Laterality: N/A;  CABG x four using left internal mammary artery and right leg greater saphenous vein   . EYE SURGERY     cataracts removed- bilateral, no IOL  . FOOT SURGERY Right January 2013   HT 5  . LEFT HEART CATHETERIZATION WITH CORONARY ANGIOGRAM N/A 10/02/2011   Procedure: LEFT HEART CATHETERIZATION WITH CORONARY ANGIOGRAM;  Surgeon: Lorretta Harp, MD;  Location: [facility] CATH [facility];  Service: Cardiovascular;  Laterality: N/A;  . LUMBAR LAMINECTOMY/DECOMPRESSION MICRODISCECTOMY  03/04/2011   Procedure: LUMBAR LAMINECTOMY/DECOMPRESSION MICRODISCECTOMY;  Surgeon: Hosie Spangle, MD;  Location: [facility];  Service: Neurosurgery;  Laterality: N/A;  Thoracic Ten-Thoracic Twelve Thoracic Laminectomy  . NM MYOVIEW LTD  09/15/2011   mild ischemia mid anterior & apical anterior region.EF: 86%  . TOE SURGERY     R foot- removed calus between 5-4  . TUBAL LIGATION     Social History   Occupational History  . Was working at [employer]  . Smoking status: Never Smoker  . Smokeless tobacco: Never Used  . Alcohol use No  . Drug use: No  . Sexual activity: Not on file

## 2016-11-10 ENCOUNTER — Ambulatory Visit: Payer: Self-pay | Admitting: Cardiovascular Disease

## 2016-11-24 ENCOUNTER — Telehealth (INDEPENDENT_AMBULATORY_CARE_PROVIDER_SITE_OTHER): Payer: Self-pay

## 2016-11-24 NOTE — Telephone Encounter (Signed)
Received vm from Virl Axe (case mgr) stating she was just assigned to this case and is requesting the 11/09/16 office note and May op note. Faxed to her @ [phone].

## 2016-11-26 ENCOUNTER — Ambulatory Visit: Payer: Medicare HMO | Admitting: Podiatry

## 2016-12-02 ENCOUNTER — Ambulatory Visit (INDEPENDENT_AMBULATORY_CARE_PROVIDER_SITE_OTHER): Payer: Medicare HMO | Admitting: Podiatry

## 2016-12-02 ENCOUNTER — Encounter: Payer: Self-pay | Admitting: Podiatry

## 2016-12-02 DIAGNOSIS — B351 Tinea unguium: Secondary | ICD-10-CM | POA: Diagnosis not present

## 2016-12-02 DIAGNOSIS — M79671 Pain in right foot: Secondary | ICD-10-CM

## 2016-12-02 DIAGNOSIS — M79672 Pain in left foot: Secondary | ICD-10-CM

## 2016-12-02 DIAGNOSIS — L600 Ingrowing nail: Secondary | ICD-10-CM | POA: Diagnosis not present

## 2016-12-02 NOTE — Progress Notes (Signed)
Subjective: 83 y.o. year old female patient presents complaining of painful nails. Patient requests toe nails, corns and calluses trimmed.  Continuing with walking exercise following left hip surgery. Having difficulty reading down to feet since the surgery.   Hx of left hip replacement following a fall and fractured hip 06/26/16.  Objective: Dermatologic: Thick yellow deformed nails x 10.  Deformed ingrown hallucal nails without broken skin. Vascular: Pedal pulses are all palpable. Orthopedic: Contracted lesser digits  Neurologic: All epicritic and tactile sensations grossly intact.  Assessment: Symptomatic dystrophic mycotic nails x 10. Ingrown hallucal nail without inflammation.  Treatment: All mycotic nails debrided.  Return in 3 months or as needed.

## 2016-12-02 NOTE — Patient Instructions (Signed)
Seen for hypertrophic nails. All nails debrided. Return in 3 months or as needed.  

## 2016-12-03 ENCOUNTER — Ambulatory Visit (INDEPENDENT_AMBULATORY_CARE_PROVIDER_SITE_OTHER): Payer: Self-pay | Admitting: Orthopaedic Surgery

## 2016-12-04 ENCOUNTER — Ambulatory Visit (INDEPENDENT_AMBULATORY_CARE_PROVIDER_SITE_OTHER): Payer: Self-pay | Admitting: Orthopaedic Surgery

## 2016-12-07 ENCOUNTER — Ambulatory Visit (INDEPENDENT_AMBULATORY_CARE_PROVIDER_SITE_OTHER): Payer: Worker's Compensation | Admitting: Orthopaedic Surgery

## 2016-12-07 ENCOUNTER — Ambulatory Visit (INDEPENDENT_AMBULATORY_CARE_PROVIDER_SITE_OTHER): Payer: Medicare HMO

## 2016-12-07 ENCOUNTER — Encounter (INDEPENDENT_AMBULATORY_CARE_PROVIDER_SITE_OTHER): Payer: Self-pay | Admitting: Orthopaedic Surgery

## 2016-12-07 ENCOUNTER — Ambulatory Visit (INDEPENDENT_AMBULATORY_CARE_PROVIDER_SITE_OTHER): Payer: Worker's Compensation

## 2016-12-07 DIAGNOSIS — M25572 Pain in left ankle and joints of left foot: Secondary | ICD-10-CM | POA: Diagnosis not present

## 2016-12-07 DIAGNOSIS — S72002A Fracture of unspecified part of neck of left femur, initial encounter for closed fracture: Secondary | ICD-10-CM | POA: Diagnosis not present

## 2016-12-07 DIAGNOSIS — M25562 Pain in left knee: Secondary | ICD-10-CM

## 2016-12-07 DIAGNOSIS — G8929 Other chronic pain: Secondary | ICD-10-CM

## 2016-12-07 NOTE — Progress Notes (Signed)
Office Visit Note   Patient: Theresa Crosby           Date of Birth: 1932/07/09           MRN: [id number] Visit Date: 12/07/2016              Requested by: Doretha Sou, MD [address] [place], [place] [address] PCP: Doretha Sou, MD   Assessment & Plan: Visit Diagnoses:  1. Left displaced femoral neck fracture ([place])   2. Chronic pain of left knee   3. Pain in left ankle and joints of left foot     Plan: From a total hip replacement standpoint she is stable and doing well.  I do think that it is important for her to do 8 more sessions of physical therapy so that she can normalize her hip strength and gait as much as possible.  From her knee and ankle standpoint this appears to be age appropriate osteoarthritis.  I would like her to finish out her physical therapy and then given her a prescription for an FCE to be completed afterwards.  I plan on seeing her back after that in order to do a right and release. Total face to face encounter time was greater than 25 minutes and over half of this time was spent in counseling and/or coordination of care.  Follow-Up Instructions: Return in about 10 weeks (around 02/15/2017).   Orders:  Orders Placed This Encounter  Procedures  . XR Pelvis 1-2 Views  . XR KNEE 3 VIEW LEFT  . XR Ankle Complete Left   No orders of the defined types were placed in this encounter.     Procedures: No procedures performed   Clinical Data: No additional findings.   Subjective: Chief Complaint  Patient presents with  . Left Knee - Pain  . Left Ankle - Pain  . Left Hip - Follow-up, Pain    Patient is 5 months status post left total hip replacement for femoral neck fracture.  This is a workers comp case.  She is also complaining today of left knee and left ankle pain is worse with activity.  She has finished 16 visits of physical therapy.  She denies any numbness and tingling.  She does not feel that she is able to return  back to her previous job permanently.    Review of Systems  Constitutional: Negative.   HENT: Negative.   Eyes: Negative.   Respiratory: Negative.   Cardiovascular: Negative.   Endocrine: Negative.   Musculoskeletal: Negative.   Neurological: Negative.   Hematological: Negative.   Psychiatric/Behavioral: Negative.   All other systems reviewed and are negative.    Objective: Vital Signs: There were no vitals taken for this visit.  Physical Exam  Constitutional: She is oriented to person, place, and time. She appears well-developed and well-nourished.  HENT:  Head: Normocephalic and atraumatic.  Eyes: EOM are normal.  Neck: Neck supple.  Pulmonary/Chest: Effort normal.  Abdominal: Soft.  Neurological: She is alert and oriented to person, place, and time.  Skin: Skin is warm. Capillary refill takes less than 2 seconds.  Psychiatric: She has a normal mood and affect. Her behavior is normal. Judgment and thought content normal.  Nursing note and vitals reviewed.   Ortho Exam Left hip exam exam shows a fully healed surgical scar.  This is stable. Left knee and ankle exam are essentially benign.  There is no effusion.  Normal range of motion of both joints. Specialty  Comments:  No specialty comments available.  Imaging: No results found.   PMFS History: Patient Active Problem List   Diagnosis Date Noted  . Left displaced femoral neck fracture ([place]) 06/26/2016  . Hypertension, essential 06/26/2016  . HLD (hyperlipidemia) 06/26/2016  . Coronary heart disease 06/26/2016  . Left ventricular diastolic dysfunction 15/40/0867  . Asthma 06/26/2016  . Left shoulder pain 12/10/2014  . Pain in toe of left foot 02/13/2014  . Pain in lower limb 12/18/2013  . Ingrown nail 11/30/2012  . Pain in joint, ankle and foot 08/30/2012  . Onychomycosis due to dermatophyte 05/30/2012  . Chest pain 11/02/2011  . Oral thrush 11/02/2011  . Neuropathy, lower extremities 10/07/2011  . HTN  (hypertension) 10/07/2011  . Dyslipidemia 10/07/2011  . Chest pain at rest associated with dyspnea 10/05/2011  . Abnormal nuclear stress test 10/05/2011  . CAD s/p CABG x 4 2013 10/05/2011  . S/P CABG x 4, LIMA-LAD, SVG-OM2, SVG-diag, SVG-RCA  10/04/11 10/05/2011   Past Medical History:  Diagnosis Date  . Abnormal nuclear stress test 10/05/2011  . Anxiety    2005- related to care of aging parent   . Arthritis    degeneration - spondylosis, OA - all over body,   . Blood transfusion    postop- back surg. 1987  . Bunion   . CAD (coronary artery disease)significant diseas w/ 99% LAD, 80% D1, 80% LCX 10/05/2011  . GERD (gastroesophageal reflux disease)   . Hyperlipemia   . Hypertension    ireg. heartbeat, no referral made to cardiologist , sees [facility] Med.   . Neuromuscular disorder (HCC)    burning- L leg , numbing of toes   . S/P CABG x 4, LIMA-LAD, SVG-OM2, SVG-diag, SVG-RCA 10/05/2011  . Shortness of breath     Family History  Problem Relation Age of Onset  . Heart attack Mother   . Heart attack Father   . Hypertension Father   . Heart attack Brother   . Anesthesia problems Neg Hx   . Hypotension Neg Hx   . Malignant hyperthermia Neg Hx   . Pseudochol deficiency Neg Hx     Past Surgical History:  Procedure Laterality Date  . ABDOMINAL HYSTERECTOMY     partial hysterectomy- 1972  . ANTERIOR APPROACH HEMI HIP ARTHROPLASTY Left 06/26/2016   Procedure: ANTERIOR APPROACH TOTAL HIP ARTHROPLASTY;  Surgeon: Leandrew Koyanagi, MD;  Location: [facility];  Service: Orthopedics;  Laterality: Left;  . BACK SURGERY     1987-   . BREAST CYST ASPIRATION  06/18/2010  . CARDIAC CATHETERIZATION  10/12/2011   high-grade ca+ prox. LAD diagonal branch,higly diseased diagonal branch & obtuse marginal branch  . CORONARY ARTERY BYPASS GRAFT  10/04/2011   Procedure: CORONARY ARTERY BYPASS GRAFTING (CABG);  Surgeon: Gaye Pollack, MD;  Location: [facility];  Service: Open Heart Surgery;  Laterality: N/A;  CABG x  four using left internal mammary artery and right leg greater saphenous vein   . EYE SURGERY     cataracts removed- bilateral, no IOL  . FOOT SURGERY Right January 2013   HT 5  . LEFT HEART CATHETERIZATION WITH CORONARY ANGIOGRAM N/A 10/02/2011   Procedure: LEFT HEART CATHETERIZATION WITH CORONARY ANGIOGRAM;  Surgeon: Lorretta Harp, MD;  Location: [facility] C[facility] LAB;  Service: Cardiovascular;  Laterality: N/A;  . LUMBAR LAMINECTOMY/DECOMPRESSION MICRODISCECTOMY  03/04/2011   Procedure: LUMBAR LAMINECTOMY/DECOMPRESSION MICRODISCECTOMY;  Surgeon: Hosie Spangle, MD;  Location: [facility];  Service: Neurosurgery;  Laterality: N/A;  Thoracic  Ten-Thoracic Twelve Thoracic Laminectomy  . NM MYOVIEW LTD  09/15/2011   mild ischemia mid anterior & apical anterior region.EF: 86%  . TOE SURGERY     R foot- removed calus between 5-4  . TUBAL LIGATION     Social History   Occupational History  . Was working at [employer]  . Smoking status: Never Smoker  . Smokeless tobacco: Never Used  . Alcohol use No  . Drug use: No  . Sexual activity: Not on file

## 2017-01-06 ENCOUNTER — Telehealth (INDEPENDENT_AMBULATORY_CARE_PROVIDER_SITE_OTHER): Payer: Self-pay | Admitting: Orthopaedic Surgery

## 2017-01-06 NOTE — Telephone Encounter (Signed)
See message.

## 2017-01-06 NOTE — Telephone Encounter (Signed)
Theresa Crosby with [facility] [employer] called advised he is scheduled FCE next Tuesday 01/12/17 and need an order to go beyond patient's current physical activities to complete FCE. The number to contact Octavia Bruckner is [phone]  The fax# is [phone]

## 2017-01-06 NOTE — Telephone Encounter (Signed)
approve

## 2017-01-12 NOTE — Telephone Encounter (Signed)
Order faxed this morning.

## 2017-01-18 ENCOUNTER — Encounter (INDEPENDENT_AMBULATORY_CARE_PROVIDER_SITE_OTHER): Payer: Self-pay | Admitting: Orthopaedic Surgery

## 2017-01-18 ENCOUNTER — Ambulatory Visit (INDEPENDENT_AMBULATORY_CARE_PROVIDER_SITE_OTHER): Payer: Worker's Compensation | Admitting: Orthopaedic Surgery

## 2017-01-18 DIAGNOSIS — S72002A Fracture of unspecified part of neck of left femur, initial encounter for closed fracture: Secondary | ICD-10-CM

## 2017-01-18 DIAGNOSIS — Z96641 Presence of right artificial hip joint: Secondary | ICD-10-CM | POA: Insufficient documentation

## 2017-01-18 NOTE — Progress Notes (Signed)
Office Visit Note   Patient: Theresa Crosby           Date of Birth: September 02, 1932           MRN: [id number] Visit Date: 01/18/2017              Requested by: Doretha Sou, MD [address] [place], [place] [address] PCP: Doretha Sou, MD   Assessment & Plan: Visit Diagnoses:  1. Presence of right artificial hip joint   2. Left displaced femoral neck fracture ([place])     Plan: At this point patient has reached MMI terms of her femoral neck fracture.  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  In terms of the specific activities these are detailed in the office the report.  She has been classified into the  light category lifting no more than 18 pounds.  Work restrictions are as detailed in [employer] report.  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  At this point she is released from my care.  We did discuss that I could refer her to a dermatologist for her keloid but she declined. Total face to face encounter time was greater than 25 minutes and over half of this time was spent in counseling and/or coordination of care.  Follow-Up Instructions: Return if symptoms worsen or fail to improve.   Orders:  No orders of the defined types were placed in this encounter.  No orders of the defined types were placed in this encounter.     Procedures: No procedures performed   Clinical Data: No additional findings.   Subjective: Chief Complaint  Patient presents with  . Right Hip - Follow-up  . Right Leg - Follow-up    Patient is 7 months status post left total hip replacement for femoral neck fracture.  She is today for her right and release.  She recently had her FCE.  She is complaining of scar tenderness.    Review of Systems   Objective: Vital Signs: There were no vitals taken for this visit.  Physical Exam  Ortho Exam Left hip exam shows a tender surgical scar.  She  does have a leg length discrepancy of approximately 1 inch.  Rotation of the hip is painless but she does have some mild limitation in range of motion. Specialty Comments:  No specialty comments available.  Imaging: No results found.   PMFS History: Patient Active Problem List   Diagnosis Date Noted  . Presence of right artificial hip joint 01/18/2017  . Left displaced femoral neck fracture ([place]) 06/26/2016  . Hypertension, essential 06/26/2016  . HLD (hyperlipidemia) 06/26/2016  . Coronary heart disease 06/26/2016  . Left ventricular diastolic dysfunction 73/53/2992  . Asthma 06/26/2016  . Left shoulder pain 12/10/2014  . Pain in toe of left foot 02/13/2014  . Pain in lower limb 12/18/2013  . Ingrown nail 11/30/2012  . Pain in joint, ankle and foot 08/30/2012  . Onychomycosis due to dermatophyte 05/30/2012  . Chest pain 11/02/2011  . Oral thrush 11/02/2011  . Neuropathy, lower extremities 10/07/2011  . HTN (hypertension) 10/07/2011  . Dyslipidemia 10/07/2011  . Chest pain at rest associated with dyspnea 10/05/2011  . Abnormal nuclear stress test 10/05/2011  . CAD s/p CABG x 4 2013 10/05/2011  . S/P CABG x 4, LIMA-LAD, SVG-OM2, SVG-diag, SVG-RCA  10/04/11 10/05/2011   Past Medical History:  Diagnosis Date  . Abnormal nuclear  stress test 10/05/2011  . Anxiety    2005- related to care of aging parent   . Arthritis    degeneration - spondylosis, OA - all over body,   . Blood transfusion    postop- back surg. 1987  . Bunion   . CAD (coronary artery disease)significant diseas w/ 99% LAD, 80% D1, 80% LCX 10/05/2011  . GERD (gastroesophageal reflux disease)   . Hyperlipemia   . Hypertension    ireg. heartbeat, no referral made to cardiologist , sees [facility] Med.   . Neuromuscular disorder (HCC)    burning- L leg , numbing of toes   . S/P CABG x 4, LIMA-LAD, SVG-OM2, SVG-diag, SVG-RCA 10/05/2011  . Shortness of breath     Family History  Problem Relation Age of Onset  .  Heart attack Mother   . Heart attack Father   . Hypertension Father   . Heart attack Brother   . Anesthesia problems Neg Hx   . Hypotension Neg Hx   . Malignant hyperthermia Neg Hx   . Pseudochol deficiency Neg Hx     Past Surgical History:  Procedure Laterality Date  . ABDOMINAL HYSTERECTOMY     partial hysterectomy- 1972  . ANTERIOR APPROACH HEMI HIP ARTHROPLASTY Left 06/26/2016   Procedure: ANTERIOR APPROACH TOTAL HIP ARTHROPLASTY;  Surgeon: Leandrew Koyanagi, MD;  Location: [facility];  Service: Orthopedics;  Laterality: Left;  . BACK SURGERY     1987-   . BREAST CYST ASPIRATION  06/18/2010  . CARDIAC CATHETERIZATION  10/12/2011   high-grade ca+ prox. LAD diagonal branch,higly diseased diagonal branch & obtuse marginal branch  . CORONARY ARTERY BYPASS GRAFT  10/04/2011   Procedure: CORONARY ARTERY BYPASS GRAFTING (CABG);  Surgeon: Gaye Pollack, MD;  Location: [facility];  Service: Open Heart Surgery;  Laterality: N/A;  CABG x four using left internal mammary artery and right leg greater saphenous vein   . EYE SURGERY     cataracts removed- bilateral, no IOL  . FOOT SURGERY Right January 2013   HT 5  . LEFT HEART CATHETERIZATION WITH CORONARY ANGIOGRAM N/A 10/02/2011   Procedure: LEFT HEART CATHETERIZATION WITH CORONARY ANGIOGRAM;  Surgeon: Lorretta Harp, MD;  Location: [facility] CATH LAB;  Service: Cardiovascular;  Laterality: N/A;  . LUMBAR LAMINECTOMY/DECOMPRESSION MICRODISCECTOMY  03/04/2011   Procedure: LUMBAR LAMINECTOMY/DECOMPRESSION MICRODISCECTOMY;  Surgeon: Hosie Spangle, MD;  Location: [facility];  Service: Neurosurgery;  Laterality: N/A;  Thoracic Ten-Thoracic Twelve Thoracic Laminectomy  . NM MYOVIEW LTD  09/15/2011   mild ischemia mid anterior & apical anterior region.EF: 86%  . TOE SURGERY     R foot- removed calus between 5-4  . TUBAL LIGATION     Social History   Occupational History  . Occupation: Was working at [employer]  . Smoking status:  Never Smoker  . Smokeless tobacco: Never Used  Substance and Sexual Activity  . Alcohol use: No    Alcohol/week: 0.0 oz  . Drug use: No  . Sexual activity: Not on file

## 2017-01-27 ENCOUNTER — Telehealth (INDEPENDENT_AMBULATORY_CARE_PROVIDER_SITE_OTHER): Payer: Self-pay

## 2017-01-27 NOTE — Telephone Encounter (Signed)
Faxed the 01/18/17 office note to the case mgr per her request

## 2017-02-18 ENCOUNTER — Encounter (INDEPENDENT_AMBULATORY_CARE_PROVIDER_SITE_OTHER): Payer: Self-pay

## 2017-02-18 ENCOUNTER — Ambulatory Visit (INDEPENDENT_AMBULATORY_CARE_PROVIDER_SITE_OTHER): Payer: Self-pay | Admitting: Orthopaedic Surgery

## 2017-02-19 ENCOUNTER — Telehealth (INDEPENDENT_AMBULATORY_CARE_PROVIDER_SITE_OTHER): Payer: Self-pay | Admitting: Orthopaedic Surgery

## 2017-02-19 NOTE — Telephone Encounter (Signed)
Corky Mull with [facility] calling to let us know they are closing Physical Therapy due to 30 day gap in treatment.

## 2017-02-19 NOTE — Telephone Encounter (Signed)
FYI

## 2017-02-19 NOTE — Telephone Encounter (Signed)
ok 

## 2017-03-03 ENCOUNTER — Telehealth (INDEPENDENT_AMBULATORY_CARE_PROVIDER_SITE_OTHER): Payer: Self-pay | Admitting: Orthopaedic Surgery

## 2017-03-03 NOTE — Telephone Encounter (Signed)
Whatever is easier for her  we are always happy to see her

## 2017-03-03 NOTE — Telephone Encounter (Signed)
Patient called saying that she is experiencing a lot of pain in her lower back down to her rectum and left lower leg. States she's been taking tylenol for the past week but it isn't helping. Didn't know if she should make an appointment with Dr. Erlinda Hong or with her PCP. CB # [phone] Won't be home til 4 so she requested a call afterwards.

## 2017-03-03 NOTE — Telephone Encounter (Signed)
Please advise 

## 2017-03-04 ENCOUNTER — Ambulatory Visit: Payer: Self-pay | Admitting: Podiatry

## 2017-03-04 NOTE — Telephone Encounter (Signed)
IC patient and advised, appt made for Monday with Dr Erlinda Hong.  She has an old Rx hydrocodone, and is taking 1/2 tabs to help ease her pain.

## 2017-03-08 ENCOUNTER — Ambulatory Visit (INDEPENDENT_AMBULATORY_CARE_PROVIDER_SITE_OTHER): Payer: Worker's Compensation | Admitting: Orthopaedic Surgery

## 2017-03-08 ENCOUNTER — Encounter (INDEPENDENT_AMBULATORY_CARE_PROVIDER_SITE_OTHER): Payer: Self-pay | Admitting: Orthopaedic Surgery

## 2017-03-08 ENCOUNTER — Ambulatory Visit (INDEPENDENT_AMBULATORY_CARE_PROVIDER_SITE_OTHER): Payer: Worker's Compensation

## 2017-03-08 DIAGNOSIS — G8929 Other chronic pain: Secondary | ICD-10-CM | POA: Diagnosis not present

## 2017-03-08 DIAGNOSIS — M25552 Pain in left hip: Secondary | ICD-10-CM

## 2017-03-08 DIAGNOSIS — M545 Low back pain: Secondary | ICD-10-CM

## 2017-03-08 MED ORDER — TIZANIDINE HCL 4 MG PO TABS: 4.0000 mg | ORAL_TABLET | Freq: Four times a day (QID) | ORAL | 2 refills | Status: DC | PRN

## 2017-03-08 MED ORDER — PREDNISONE 10 MG (21) PO TBPK: ORAL_TABLET | ORAL | 0 refills | Status: DC

## 2017-03-08 NOTE — Progress Notes (Signed)
Office Visit Note   Patient: Theresa Crosby           Date of Birth: 09-08-1932           MRN: [id number] Visit Date: 03/08/2017              Requested by: Doretha Sou, MD [address] [place], [place] [address] PCP: Doretha Sou, MD   Assessment & Plan: Visit Diagnoses:  1. Chronic low back pain, unspecified back pain laterality, with sciatica presence unspecified   2. Pain in left hip     Plan: Impression is a female with lumbar radiculopathy.  Prescription for prednisone taper and Zanaflex.  If not better patient instructed to give [place] a call so that we can order an MRI.  She has had previous MRIs in 2012 and 2015 which shows degenerative changes.  Follow-Up Instructions: Return if symptoms worsen or fail to improve.   Orders:  Orders Placed This Encounter  Procedures  . XR HIP UNILAT W OR W/O PELVIS 2-3 VIEWS LEFT  . XR Lumbar Spine 2-3 Views   Meds ordered this encounter  Medications  . predniSONE (STERAPRED UNI-PAK 21 TAB) 10 MG (21) TBPK tablet    Sig: Take as directed    Dispense:  21 tablet    Refill:  0  . tiZANidine (ZANAFLEX) 4 MG tablet    Sig: Take 1 tablet (4 mg total) by mouth every 6 (six) hours as needed for muscle spasms.    Dispense:  30 tablet    Refill:  2      Procedures: No procedures performed   Clinical Data: No additional findings.   Subjective: Chief Complaint  Patient presents with  . Right Hip - Pain  . Lower Back - Pain    Patient is a 84 year old female comes in with a few day history of low back pain with radiation to her bilateral left ankle.  Denies any injuries.  She walks with a cane.  She states that she has been fairly active recently.  She endorses some numbness and tingling.  Denies any bowel or bladder dysfunction.    Review of Systems  Constitutional: Negative.   HENT: Negative.   Eyes: Negative.   Respiratory: Negative.   Cardiovascular: Negative.   Endocrine: Negative.     Musculoskeletal: Negative.   Neurological: Negative.   Hematological: Negative.   Psychiatric/Behavioral: Negative.   All other systems reviewed and are negative.    Objective: Vital Signs: There were no vitals taken for this visit.  Physical Exam  Constitutional: She is oriented to person, place, and time. She appears well-developed and well-nourished.  HENT:  Head: Normocephalic and atraumatic.  Eyes: EOM are normal.  Neck: Neck supple.  Pulmonary/Chest: Effort normal.  Abdominal: Soft.  Neurological: She is alert and oriented to person, place, and time.  Skin: Skin is warm. Capillary refill takes less than 2 seconds.  Psychiatric: She has a normal mood and affect. Her behavior is normal. Judgment and thought content normal.  Nursing note and vitals reviewed.   Ortho Exam Left hip exam shows painless rotation.  Negative straight leg.  Low back is tender to palpation.  She has constant radiation down to her lateral left ankle. Specialty Comments:  No specialty comments available.  Imaging: Xr Hip Unilat W Or W/o Pelvis 2-3 Views Left  Result Date: 03/08/2017 Stable left total hip replacement and stable alignment  Xr Lumbar Spine 2-3 Views  Result Date: 03/08/2017 Advanced degenerative  disc disease at L4-5 and lumbar spondylosis with degenerative scoliosis    PMFS History: Patient Active Problem List   Diagnosis Date Noted  . Presence of right artificial hip joint 01/18/2017  . Left displaced femoral neck fracture ([place]) 06/26/2016  . Hypertension, essential 06/26/2016  . HLD (hyperlipidemia) 06/26/2016  . Coronary heart disease 06/26/2016  . Left ventricular diastolic dysfunction 08/12/9483  . Asthma 06/26/2016  . Left shoulder pain 12/10/2014  . Pain in toe of left foot 02/13/2014  . Pain in lower limb 12/18/2013  . Ingrown nail 11/30/2012  . Pain in joint, ankle and foot 08/30/2012  . Onychomycosis due to dermatophyte 05/30/2012  . Chest pain 11/02/2011   . Oral thrush 11/02/2011  . Neuropathy, lower extremities 10/07/2011  . HTN (hypertension) 10/07/2011  . Dyslipidemia 10/07/2011  . Chest pain at rest associated with dyspnea 10/05/2011  . Abnormal nuclear stress test 10/05/2011  . CAD s/p CABG x 4 2013 10/05/2011  . S/P CABG x 4, LIMA-LAD, SVG-OM2, SVG-diag, SVG-RCA  10/04/11 10/05/2011   Past Medical History:  Diagnosis Date  . Abnormal nuclear stress test 10/05/2011  . Anxiety    2005- related to care of aging parent   . Arthritis    degeneration - spondylosis, OA - all over body,   . Blood transfusion    postop- back surg. 1987  . Bunion   . CAD (coronary artery disease)significant diseas w/ 99% LAD, 80% D1, 80% LCX 10/05/2011  . GERD (gastroesophageal reflux disease)   . Hyperlipemia   . Hypertension    ireg. heartbeat, no referral made to cardiologist , sees [place]. [facility].   . Neuromuscular disorder (HCC)    burning- L leg , numbing of toes   . S/P CABG x 4, LIMA-LAD, SVG-OM2, SVG-diag, SVG-RCA 10/05/2011  . Shortness of breath     Family History  Problem Relation Age of Onset  . Heart attack Mother   . Heart attack Father   . Hypertension Father   . Heart attack Brother   . Anesthesia problems Neg Hx   . Hypotension Neg Hx   . Malignant hyperthermia Neg Hx   . Pseudochol deficiency Neg Hx     Past Surgical History:  Procedure Laterality Date  . ABDOMINAL HYSTERECTOMY     partial hysterectomy- 1972  . ANTERIOR APPROACH HEMI HIP ARTHROPLASTY Left 06/26/2016   Procedure: ANTERIOR APPROACH TOTAL HIP ARTHROPLASTY;  Surgeon: Leandrew Koyanagi, MD;  Location: [place];  Service: Orthopedics;  Laterality: Left;  . BACK SURGERY     1987-   . BREAST CYST ASPIRATION  06/18/2010  . CARDIAC CATHETERIZATION  10/12/2011   high-grade ca+ prox. LAD diagonal branch,higly diseased diagonal branch & obtuse marginal branch  . CORONARY ARTERY BYPASS GRAFT  10/04/2011   Procedure: CORONARY ARTERY BYPASS GRAFTING (CABG);  Surgeon: Gaye Pollack,  MD;  Location: [facility];  Service: Open Heart Surgery;  Laterality: N/A;  CABG x four using left internal mammary artery and right leg greater saphenous vein   . EYE SURGERY     cataracts removed- bilateral, no IOL  . FOOT SURGERY Right January 2013   HT 5  . LEFT HEART CATHETERIZATION WITH CORONARY ANGIOGRAM N/A 10/02/2011   Procedure: LEFT HEART CATHETERIZATION WITH CORONARY ANGIOGRAM;  Surgeon: Lorretta Harp, MD;  Location: [facility] C[facility] LAB;  Service: Cardiovascular;  Laterality: N/A;  . LUMBAR LAMINECTOMY/DECOMPRESSION MICRODISCECTOMY  03/04/2011   Procedure: LUMBAR LAMINECTOMY/DECOMPRESSION MICRODISCECTOMY;  Surgeon: Hosie Spangle, MD;  Location: [place]  [facility];  Service: Neurosurgery;  Laterality: N/A;  Thoracic Ten-Thoracic Twelve Thoracic Laminectomy  . NM MYOVIEW LTD  09/15/2011   mild ischemia mid anterior & apical anterior region.EF: 86%  . TOE SURGERY     R foot- removed calus between 5-4  . TUBAL LIGATION     Social History   Occupational History  . Occupation: Was working at [employer]  . Smoking status: Never Smoker  . Smokeless tobacco: Never Used  Substance and Sexual Activity  . Alcohol use: No    Alcohol/week: 0.0 oz  . Drug use: No  . Sexual activity: Not on file

## 2017-03-12 ENCOUNTER — Telehealth (INDEPENDENT_AMBULATORY_CARE_PROVIDER_SITE_OTHER): Payer: Self-pay | Admitting: Orthopaedic Surgery

## 2017-03-12 NOTE — Telephone Encounter (Signed)
Patient was seen on Monday for leg and back pain and was put on Prednisone, she states that Tuesday she has an appointment at the dentist and they are supposed to do "slight gum surgery" will this interfere with anything or injections, or if she does not need to have surgery. Please advise # [phone]

## 2017-03-15 ENCOUNTER — Telehealth (INDEPENDENT_AMBULATORY_CARE_PROVIDER_SITE_OTHER): Payer: Self-pay | Admitting: Orthopaedic Surgery

## 2017-03-15 ENCOUNTER — Other Ambulatory Visit (INDEPENDENT_AMBULATORY_CARE_PROVIDER_SITE_OTHER): Payer: Self-pay

## 2017-03-15 NOTE — Telephone Encounter (Signed)
Patient called asked for a call back as soon as possible concerning message she left on Friday. The number to contact patient is [phone]

## 2017-03-15 NOTE — Telephone Encounter (Signed)
Please advise 

## 2017-03-15 NOTE — Telephone Encounter (Signed)
2 g amoxicillin 30 mins before surgery

## 2017-03-15 NOTE — Telephone Encounter (Signed)
Called patient to see what pharm she used and she states she does not want anymore medicine. Would like an cortisone injection in the leg. States she would like to speak to DR Erlinda Hong no one else. She is off balance and in so  Much pain. PLEASE call patient.  CB: [phone]

## 2017-03-15 NOTE — Telephone Encounter (Signed)
I called and left voicemail.

## 2017-03-16 ENCOUNTER — Telehealth (INDEPENDENT_AMBULATORY_CARE_PROVIDER_SITE_OTHER): Payer: Self-pay | Admitting: Orthopaedic Surgery

## 2017-03-16 NOTE — Telephone Encounter (Signed)
Please call patient

## 2017-03-16 NOTE — Telephone Encounter (Signed)
Patient called in regards to a call from [place] last night.  Patient wants a call back she missed the call.  She did not want to give reason for the call

## 2017-03-16 NOTE — Telephone Encounter (Signed)
I called and spoke with patient, she does not want to come tomorrow, due to possible inclement weather. Made appointment for Thursday with Mendel Ryder.

## 2017-03-16 NOTE — Telephone Encounter (Signed)
I told her to call an appt for this week with Mendel Ryder

## 2017-03-18 ENCOUNTER — Ambulatory Visit (INDEPENDENT_AMBULATORY_CARE_PROVIDER_SITE_OTHER): Payer: Worker's Compensation | Admitting: Physician Assistant

## 2017-03-18 ENCOUNTER — Encounter (INDEPENDENT_AMBULATORY_CARE_PROVIDER_SITE_OTHER): Payer: Self-pay | Admitting: Physician Assistant

## 2017-03-18 DIAGNOSIS — M5432 Sciatica, left side: Secondary | ICD-10-CM

## 2017-03-18 DIAGNOSIS — M7062 Trochanteric bursitis, left hip: Secondary | ICD-10-CM | POA: Diagnosis not present

## 2017-03-18 MED ORDER — BUPIVACAINE HCL 0.25 % IJ SOLN
2.0000 mL | INTRAMUSCULAR | Status: AC | PRN
  Administered 2017-03-18: 2 mL via INTRA_ARTICULAR

## 2017-03-18 MED ORDER — METHYLPREDNISOLONE ACETATE 40 MG/ML IJ SUSP
40.0000 mg | INTRAMUSCULAR | Status: AC | PRN
  Administered 2017-03-18: 40 mg via INTRA_ARTICULAR

## 2017-03-18 MED ORDER — LIDOCAINE HCL 1 % IJ SOLN
3.0000 mL | INTRAMUSCULAR | Status: AC | PRN
  Administered 2017-03-18: 3 mL

## 2017-03-18 NOTE — Progress Notes (Signed)
Office Visit Note   Patient: Theresa Crosby           Date of Birth: [birth year]           MRN: [id number] Visit Date: 03/18/2017              Requested by: Doretha Sou, MD [address] [place], [place] [address] PCP: Doretha Sou, MD   Assessment & Plan: Visit Diagnoses:  1. Trochanteric bursitis, left hip   2. Sciatica, left side     Plan: At this point.  Has a multitude of issues to include sciatica as well as trochanteric bursitis to the left hip.  Of note is worth trying a cortisone injection into the left greater trochanter to see if this is of some relief.  If she does not get any relief with this, we will likely refer her back to neurosurgery.  She does mention that she would like to see a different neurosurgeon than Dr. Sherwood Gambler who she has seen in the past.  She will call and let us know within the next week or so.  Follow-Up Instructions: Return if symptoms worsen or fail to improve.   Orders:  No orders of the defined types were placed in this encounter.  No orders of the defined types were placed in this encounter.     Procedures: Large Joint Inj: L greater trochanter on 03/18/2017 1:07 PM Indications: pain Details: 22 G needle, lateral approach Medications: 3 mL lidocaine 1 %; 2 mL bupivacaine 0.25 %; 40 mg methylPREDNISolone acetate 40 MG/ML      Clinical Data: No additional findings.   Subjective: No chief complaint on file.   HPI.  Comes in for follow-up.  Continued left lower extremity pain and weakness.  History of sciatica recently put on prednisone.  This minimally helped but did make her significantly sad.  She finished this this past Monday.  She does note that she has seen Dr. Sherwood Gambler in the past for her back with the last MRI being in 2017.  She has had an epidural steroid injection from his office.  She is also status post left total hip replacement 06/26/2016.  Today, she has pain to the  left lateral leg as well as  the groin.  She does note burning to her thighs.  Pain is worse sleeping on the left side as well as trying to put on her shoe.  Review of Systems as detailed in HPI.  All others reviewed and are negative.   Objective: Vital Signs: There were no vitals taken for this visit.  Physical Exam well-developed well-nourished female in no acute distress.  Alert and oriented x3.  Ortho Exam examination of her left lower extremity reveals marked tenderness over the greater trochanter.  Minimally positive logroll.  Positive straight leg raise.    Specialty Comments:  No specialty comments available.  Imaging: No results found.   PMFS History: Patient Active Problem List   Diagnosis Date Noted  . Trochanteric bursitis, left hip 03/18/2017  . Sciatica, left side 03/18/2017  . Presence of right artificial hip joint 01/18/2017  . Left displaced femoral neck fracture ([place]) 06/26/2016  . Hypertension, essential 06/26/2016  . HLD (hyperlipidemia) 06/26/2016  . Coronary heart disease 06/26/2016  . Left ventricular diastolic dysfunction 78/67/6720  . Asthma 06/26/2016  . Left shoulder pain 12/10/2014  . Pain in toe of left foot 02/13/2014  . Pain in lower limb 12/18/2013  . Ingrown nail 11/30/2012  . Pain  in joint, ankle and foot 08/30/2012  . Onychomycosis due to dermatophyte 05/30/2012  . Chest pain 11/02/2011  . Oral thrush 11/02/2011  . Neuropathy, lower extremities 10/07/2011  . HTN (hypertension) 10/07/2011  . Dyslipidemia 10/07/2011  . Chest pain at rest associated with dyspnea 10/05/2011  . Abnormal nuclear stress test 10/05/2011  . CAD s/p CABG x 4 2013 10/05/2011  . S/P CABG x 4, LIMA-LAD, SVG-OM2, SVG-diag, SVG-RCA  10/04/11 10/05/2011   Past Medical History:  Diagnosis Date  . Abnormal nuclear stress test 10/05/2011  . Anxiety    2005- related to care of aging parent   . Arthritis    degeneration - spondylosis, OA - all over body,   . Blood transfusion    postop- back  surg. 1987  . Bunion   . CAD (coronary artery disease)significant diseas w/ 99% LAD, 80% D1, 80% LCX 10/05/2011  . GERD (gastroesophageal reflux disease)   . Hyperlipemia   . Hypertension    ireg. heartbeat, no referral made to cardiologist , sees [place] Med.   . Neuromuscular disorder (HCC)    burning- L leg , numbing of toes   . S/P CABG x 4, LIMA-LAD, SVG-OM2, SVG-diag, SVG-RCA 10/05/2011  . Shortness of breath     Family History  Problem Relation Age of Onset  . Heart attack Mother   . Heart attack Father   . Hypertension Father   . Heart attack Brother   . Anesthesia problems Neg Hx   . Hypotension Neg Hx   . Malignant hyperthermia Neg Hx   . Pseudochol deficiency Neg Hx     Past Surgical History:  Procedure Laterality Date  . ABDOMINAL HYSTERECTOMY     partial hysterectomy- 1972  . ANTERIOR APPROACH HEMI HIP ARTHROPLASTY Left 06/26/2016   Procedure: ANTERIOR APPROACH TOTAL HIP ARTHROPLASTY;  Surgeon: Leandrew Koyanagi, MD;  Location: [facility];  Service: Orthopedics;  Laterality: Left;  . BACK SURGERY     1987-   . BREAST CYST ASPIRATION  06/18/2010  . CARDIAC CATHETERIZATION  10/12/2011   high-grade ca+ prox. LAD diagonal branch,higly diseased diagonal branch & obtuse marginal branch  . CORONARY ARTERY BYPASS GRAFT  10/04/2011   Procedure: CORONARY ARTERY BYPASS GRAFTING (CABG);  Surgeon: Gaye Pollack, MD;  Location: [facility];  Service: Open Heart Surgery;  Laterality: N/A;  CABG x four using left internal mammary artery and right leg greater saphenous vein   . EYE SURGERY     cataracts removed- bilateral, no IOL  . FOOT SURGERY Right January 2013   HT 5  . LEFT HEART CATHETERIZATION WITH CORONARY ANGIOGRAM N/A 10/02/2011   Procedure: LEFT HEART CATHETERIZATION WITH CORONARY ANGIOGRAM;  Surgeon: Lorretta Harp, MD;  Location: [facility] CATH LAB;  Service: Cardiovascular;  Laterality: N/A;  . LUMBAR LAMINECTOMY/DECOMPRESSION MICRODISCECTOMY  03/04/2011   Procedure: LUMBAR  LAMINECTOMY/DECOMPRESSION MICRODISCECTOMY;  Surgeon: Hosie Spangle, MD;  Location: [place];  Service: Neurosurgery;  Laterality: N/A;  Thoracic Ten-Thoracic Twelve Thoracic Laminectomy  . NM MYOVIEW LTD  09/15/2011   mild ischemia mid anterior & apical anterior region.EF: 86%  . TOE SURGERY     R foot- removed calus between 5-4  . TUBAL LIGATION     Social History   Occupational History  . Occupation: Was working at [employer]  . Smoking status: Never Smoker  . Smokeless tobacco: Never Used  Substance and Sexual Activity  . Alcohol use: No    Alcohol/week: 0.0 oz  .  Drug use: No  . Sexual activity: Not on file       

## 2017-04-13 ENCOUNTER — Telehealth (INDEPENDENT_AMBULATORY_CARE_PROVIDER_SITE_OTHER): Payer: Self-pay

## 2017-04-13 NOTE — Telephone Encounter (Signed)
This patient called me and states that work comp is sending her for a 2nd opinion appt with Dr. Jodelle Gross in [facility] and she needs a CD of her xrays to take with her. Can you please help her with this and call her when its ready and she will pick up. Thanks

## 2017-04-13 NOTE — Telephone Encounter (Signed)
Pt called to advise that work comp is sending her for a 2nd opinion appt with Dr. Jodelle Gross in [facility] and she needs her op note faxed to his office at [phone].

## 2017-04-16 IMAGING — CR DG CHEST 2V
2 series · 2 of 2 positions shown · non-contrast
Comparison: 05/11/2012

CLINICAL DATA: Left chest pain, back and shoulder pain for 1 month.
No known injury.

EXAM:
CHEST  2 VIEW

[w chest pa]
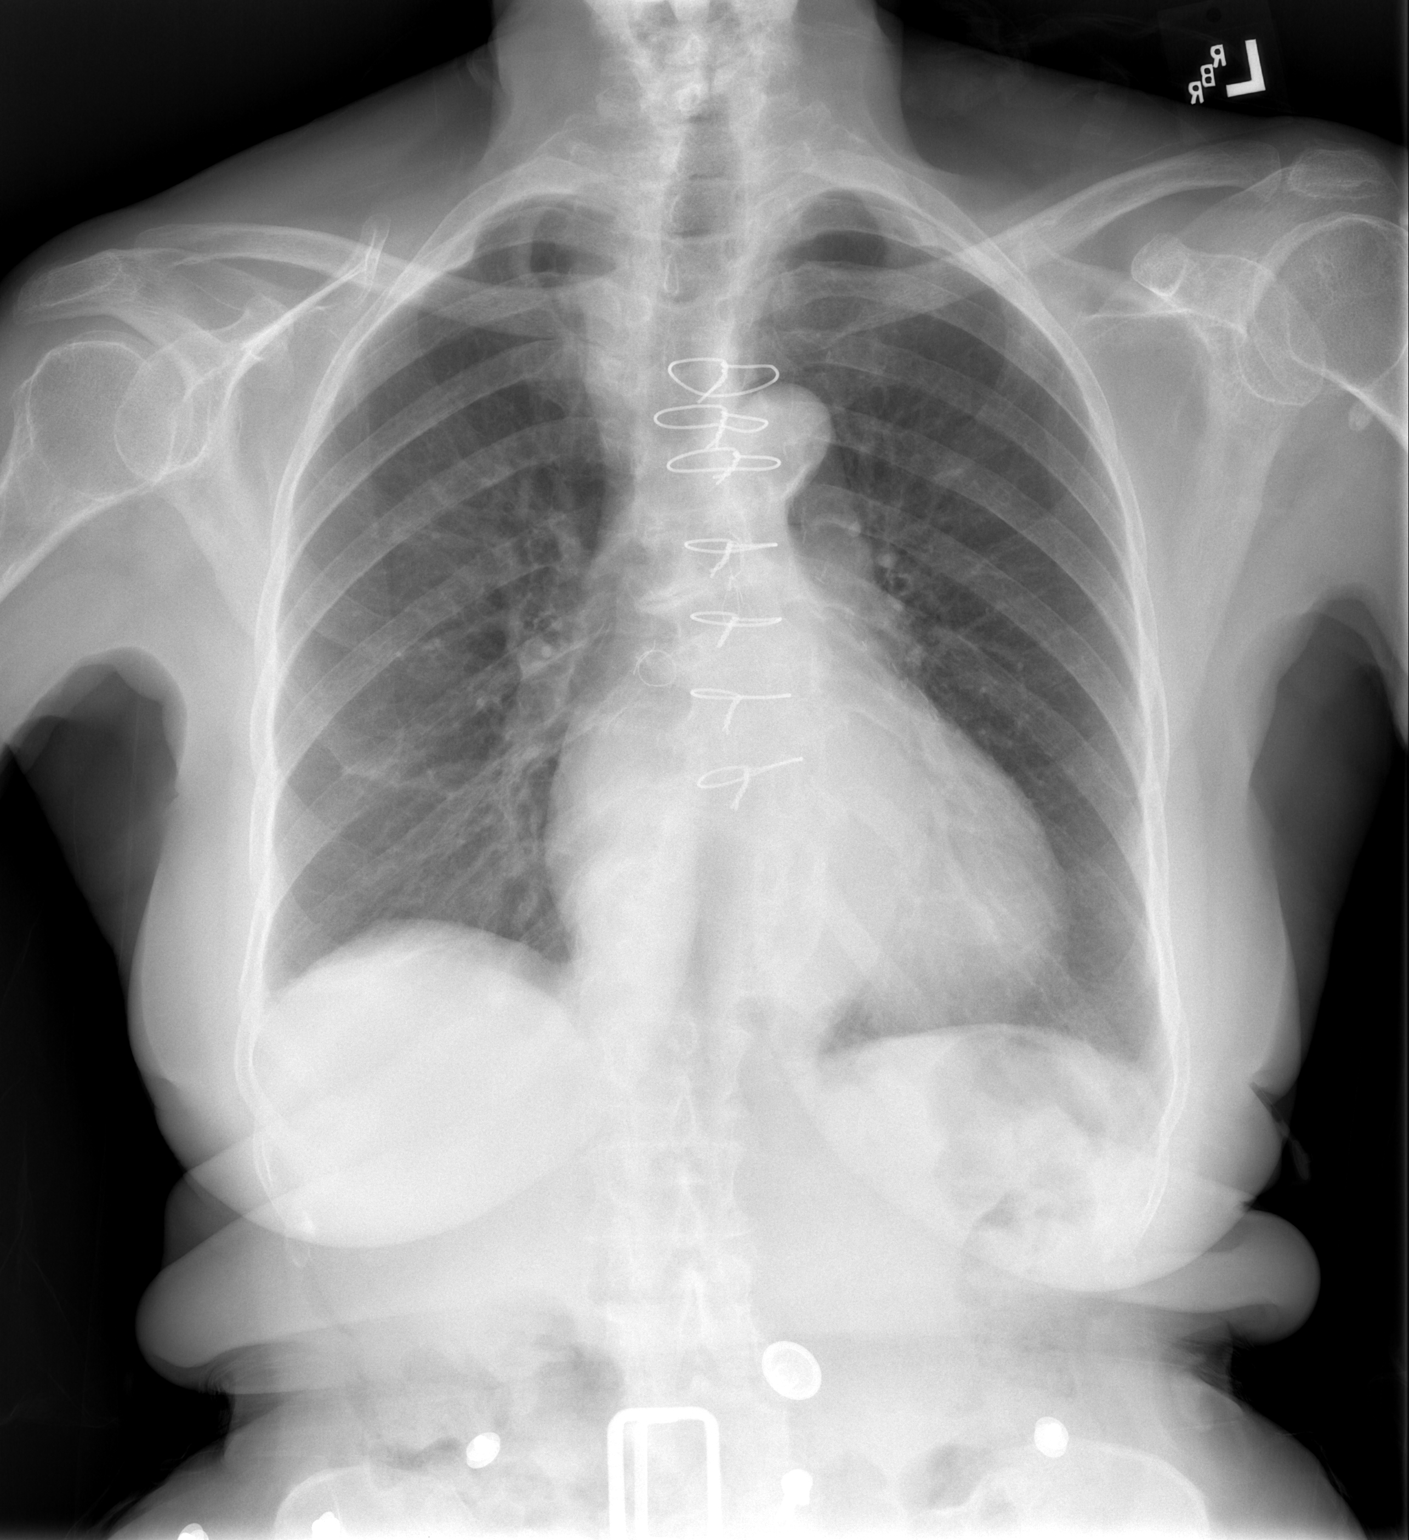

[w chest lat]
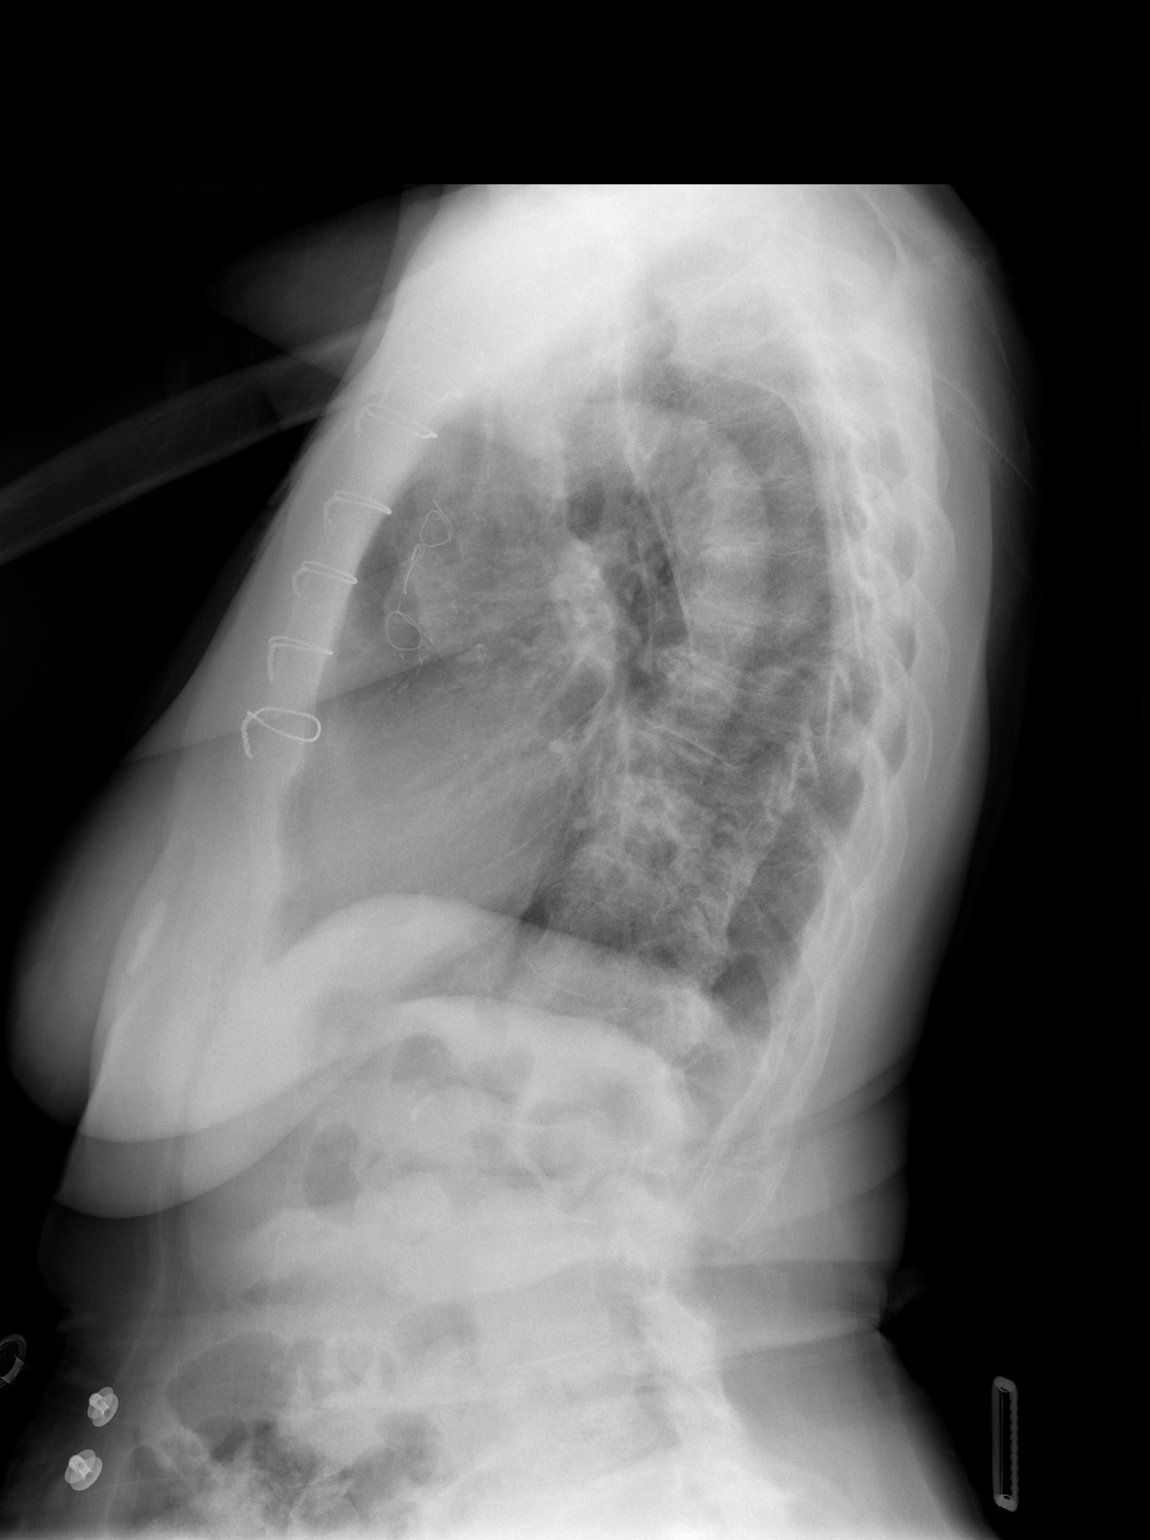

[2 of 2 positions shown; findings below may reference images not displayed]

FINDINGS: Prior CABG. Mild cardiomegaly. Mild hyperinflation. Lungs are clear.
No effusions or edema. No acute bony abnormality.
IMPRESSION: Cardiomegaly.  No active disease.  Mild hyperinflation.

## 2017-05-10 ENCOUNTER — Ambulatory Visit (INDEPENDENT_AMBULATORY_CARE_PROVIDER_SITE_OTHER): Payer: Worker's Compensation | Admitting: Orthopaedic Surgery

## 2017-05-10 ENCOUNTER — Encounter (INDEPENDENT_AMBULATORY_CARE_PROVIDER_SITE_OTHER): Payer: Self-pay | Admitting: Orthopaedic Surgery

## 2017-05-10 DIAGNOSIS — Z96642 Presence of left artificial hip joint: Secondary | ICD-10-CM | POA: Diagnosis not present

## 2017-05-10 NOTE — Progress Notes (Signed)
Post-Op Visit Note   Patient: Theresa Crosby           Date of Birth: Jun 27, 1932           MRN: [id number] Visit Date: 05/10/2017 PCP: Doretha Sou, MD   Assessment & Plan:  Chief Complaint:  Chief Complaint  Patient presents with  . Left Hip - Pain, Follow-up   Visit Diagnoses:  1. Status post total hip replacement, left     Plan: Patient comes in for follow-up of her left hip.  Status post  left total hip replacement, date of surgery 06/26/2016.  We saw her a few months back where she was having left lower extremity pain.  She had a component of lumbar radiculopathy as well as trochanteric bursitis.  We injected her trochanteric bursa which is significantly helped.  She still has occasional burning to the groin as well as to the top of her foot.  She is seen Dr. Sherwood Gambler in the past.  Examination of the left hip reveals minimal tenderness over the trochanteric bursa.  Full hip flexion.  Negative logroll.  Positive straight leg raise.  She is neurovascular intact distally.  At this point, we would like for Theresa Crosby to continue working on her home exercise program to include iliotibial band stretches.  She will follow-up with [place] in 2 weeks time for repeat evaluation and x-ray of her left hip.  She will call with concerns or questions in the meantime.  Follow-Up Instructions: Return in about 2 months (around 07/10/2017).   Orders:  No orders of the defined types were placed in this encounter.  No orders of the defined types were placed in this encounter.   Imaging: No results found.  PMFS History: Patient Active Problem List   Diagnosis Date Noted  . Status post total hip replacement, left 05/10/2017  . Trochanteric bursitis, left hip 03/18/2017  . Sciatica, left side 03/18/2017  . Presence of right artificial hip joint 01/18/2017  . Left displaced femoral neck fracture ([place]) 06/26/2016  . Hypertension, essential 06/26/2016  . HLD (hyperlipidemia) 06/26/2016  .  Coronary heart disease 06/26/2016  . Left ventricular diastolic dysfunction 40/98/1191  . Asthma 06/26/2016  . Left shoulder pain 12/10/2014  . Pain in toe of left foot 02/13/2014  . Pain in lower limb 12/18/2013  . Ingrown nail 11/30/2012  . Pain in joint, ankle and foot 08/30/2012  . Onychomycosis due to dermatophyte 05/30/2012  . Chest pain 11/02/2011  . Oral thrush 11/02/2011  . Neuropathy, lower extremities 10/07/2011  . HTN (hypertension) 10/07/2011  . Dyslipidemia 10/07/2011  . Chest pain at rest associated with dyspnea 10/05/2011  . Abnormal nuclear stress test 10/05/2011  . CAD s/p CABG x 4 2013 10/05/2011  . S/P CABG x 4, LIMA-LAD, SVG-OM2, SVG-diag, SVG-RCA  10/04/11 10/05/2011   Past Medical History:  Diagnosis Date  . Abnormal nuclear stress test 10/05/2011  . Anxiety    2005- related to care of aging parent   . Arthritis    degeneration - spondylosis, OA - all over body,   . Blood transfusion    postop- back surg. 1987  . Bunion   . CAD (coronary artery disease)significant diseas w/ 99% LAD, 80% D1, 80% LCX 10/05/2011  . GERD (gastroesophageal reflux disease)   . Hyperlipemia   . Hypertension    ireg. heartbeat, no referral made to cardiologist , sees [place] Med.   . Neuromuscular disorder (HCC)    burning- L leg , numbing of toes   .  S/P CABG x 4, LIMA-LAD, SVG-OM2, SVG-diag, SVG-RCA 10/05/2011  . Shortness of breath     Family History  Problem Relation Age of Onset  . Heart attack Mother   . Heart attack Father   . Hypertension Father   . Heart attack Brother   . Anesthesia problems Neg Hx   . Hypotension Neg Hx   . Malignant hyperthermia Neg Hx   . Pseudochol deficiency Neg Hx     Past Surgical History:  Procedure Laterality Date  . ABDOMINAL HYSTERECTOMY     partial hysterectomy- 1972  . ANTERIOR APPROACH HEMI HIP ARTHROPLASTY Left 06/26/2016   Procedure: ANTERIOR APPROACH TOTAL HIP ARTHROPLASTY;  Surgeon: Leandrew Koyanagi, MD;  Location: [place];   Service: Orthopedics;  Laterality: Left;  . BACK SURGERY     1987-   . BREAST CYST ASPIRATION  06/18/2010  . CARDIAC CATHETERIZATION  10/12/2011   high-grade ca+ prox. LAD diagonal branch,higly diseased diagonal branch & obtuse marginal branch  . CORONARY ARTERY BYPASS GRAFT  10/04/2011   Procedure: CORONARY ARTERY BYPASS GRAFTING (CABG);  Surgeon: Gaye Pollack, MD;  Location: [place];  Service: Open Heart Surgery;  Laterality: N/A;  CABG x four using left internal mammary artery and right leg greater saphenous vein   . EYE SURGERY     cataracts removed- bilateral, no IOL  . FOOT SURGERY Right January 2013   HT 5  . LEFT HEART CATHETERIZATION WITH CORONARY ANGIOGRAM N/A 10/02/2011   Procedure: LEFT HEART CATHETERIZATION WITH CORONARY ANGIOGRAM;  Surgeon: Lorretta Harp, MD;  Location: [facility] CATH LAB;  Service: Cardiovascular;  Laterality: N/A;  . LUMBAR LAMINECTOMY/DECOMPRESSION MICRODISCECTOMY  03/04/2011   Procedure: LUMBAR LAMINECTOMY/DECOMPRESSION MICRODISCECTOMY;  Surgeon: Hosie Spangle, MD;  Location: [place] NEURO ORS;  Service: Neurosurgery;  Laterality: N/A;  Thoracic Ten-Thoracic Twelve Thoracic Laminectomy  . NM MYOVIEW LTD  09/15/2011   mild ischemia mid anterior & apical anterior region.EF: 86%  . TOE SURGERY     R foot- removed calus between 5-4  . TUBAL LIGATION     Social History   Occupational History  . Occupation: Was working at [employer]  . Smoking status: Never Smoker  . Smokeless tobacco: Never Used  Substance and Sexual Activity  . Alcohol use: No    Alcohol/week: 0.0 oz  . Drug use: No  . Sexual activity: Not on file

## 2017-05-20 NOTE — Progress Notes (Signed)
Cardiology Office Note   Date:  05/24/2017   ID:  Theresa Crosby, DOB [birth year], MRN [id number]  PCP:  Doretha Sou, MD  Cardiologist: Dr. Sallyanne Kuster   Chief Complaint  Patient presents with  . Shortness of Breath  . Chest Pain     History of Present Illness: Theresa Crosby is a 84 y.o. female who presents for ongoing assessment and management of CAD status post CABG in 2013 (LIMA to LAD, SVG-OM 2, SVG-diagonal, SVG-RCA , August 2013), hypertension and hyperlipidemia, and right hip replacement in the setting of a mechanical fall she was last seen by Dr. Sallyanne Kuster on 09/18/2016 for follow-up.  At that time no planned testing or medication changes were made at that time.  She comes today complaining of dyspnea.  On further interrogation of her symptoms, she states that she feels it only when she is frustrated.  She has grandchildren and family members who are stressful to be around and she begins to feel tightness in her chest with associated trouble breathing.  When family goes away and she is by herself, she feels better.  The patient did take a 30-minute walk with her son yesterday and had no trouble with chest discomfort or dyspnea on exertion.  But on returning home to a "house full of people"she again felt some tenseness in her chest and had trouble taking deep breaths.  Patient also states she has trouble breathing when she becomes frustrated sleep at night or bathing herself.  Bath bench and cannot reach her back as well or use the hand-held sprayer without causing some frustration.  At that point she has trouble breathing.  The patient also complains of pain at her sternotomy site with a keloid scar formation causing her to have soreness sent to dermatologist by her primary care but did not like in [place] but is not kept that appointment.  Past Medical History:  Diagnosis Date  . Abnormal nuclear stress test 10/05/2011  . Anxiety    2005- related to care of aging  parent   . Arthritis    degeneration - spondylosis, OA - all over body,   . Blood transfusion    postop- back surg. 1987  . Bunion   . CAD (coronary artery disease)significant diseas w/ 99% LAD, 80% D1, 80% LCX 10/05/2011  . GERD (gastroesophageal reflux disease)   . Hyperlipemia   . Hypertension    ireg. heartbeat, no referral made to cardiologist , sees [place].   . Neuromuscular disorder (HCC)    burning- L leg , numbing of toes   . S/P CABG x 4, LIMA-LAD, SVG-OM2, SVG-diag, SVG-RCA 10/05/2011  . Shortness of breath     Past Surgical History:  Procedure Laterality Date  . ABDOMINAL HYSTERECTOMY     partial hysterectomy- 1972  . ANTERIOR APPROACH HEMI HIP ARTHROPLASTY Left 06/26/2016   Procedure: ANTERIOR APPROACH TOTAL HIP ARTHROPLASTY;  Surgeon: Leandrew Koyanagi, MD;  Location: [facility];  Service: Orthopedics;  Laterality: Left;  . BACK SURGERY     1987-   . BREAST CYST ASPIRATION  06/18/2010  . CARDIAC CATHETERIZATION  10/12/2011   high-grade ca+ prox. LAD diagonal branch,higly diseased diagonal branch & obtuse marginal branch  . CORONARY ARTERY BYPASS GRAFT  10/04/2011   Procedure: CORONARY ARTERY BYPASS GRAFTING (CABG);  Surgeon: Gaye Pollack, MD;  Location: [facility];  Service: Open Heart Surgery;  Laterality: N/A;  CABG x four using left internal mammary artery and right leg greater saphenous vein   .  EYE SURGERY     cataracts removed- bilateral, no IOL  . FOOT SURGERY Right January 2013   HT 5  . LEFT HEART CATHETERIZATION WITH CORONARY ANGIOGRAM N/A 10/02/2011   Procedure: LEFT HEART CATHETERIZATION WITH CORONARY ANGIOGRAM;  Surgeon: Lorretta Harp, MD;  Location: [facility] CATH LAB;  Service: Cardiovascular;  Laterality: N/A;  . LUMBAR LAMINECTOMY/DECOMPRESSION MICRODISCECTOMY  03/04/2011   Procedure: LUMBAR LAMINECTOMY/DECOMPRESSION MICRODISCECTOMY;  Surgeon: Hosie Spangle, MD;  Location: [facility];  Service: Neurosurgery;  Laterality: N/A;  Thoracic Ten-Thoracic Twelve  Thoracic Laminectomy  . NM MYOVIEW LTD  09/15/2011   mild ischemia mid anterior & apical anterior region.EF: 86%  . TOE SURGERY     R foot- removed calus between 5-4  . TUBAL LIGATION       Current Outpatient Medications  Medication Sig Dispense Refill  . albuterol (PROVENTIL HFA;VENTOLIN HFA) 108 (90 BASE) MCG/ACT inhaler Inhale 1-2 puffs into the lungs every 6 (six) hours as needed for wheezing or shortness of breath.    Marland Kitchen alendronate (FOSAMAX) 35 MG tablet TAKE 1 TAB BY MOUTH EVERY 7DAY TAKE MED ON EMPTY STOMACH W/FULL GLASS OF WATER/DO NOT LIE DOWN 30MIN  0  . amLODipine (NORVASC) 5 MG tablet Take 5 mg by mouth daily.      Marland Kitchen aspirin EC 81 MG tablet Take 1 tablet (81 mg total) by mouth daily. 100 tablet 0  . Cholecalciferol (VITAMIN D3) 1000 UNITS CAPS Take 2,000 Units by mouth every morning.      . dorzolamide-timolol (COSOPT) 22.3-6.8 MG/ML ophthalmic solution Place 1 drop into both eyes 2 (two) times daily.  5  . estradiol (ESTRACE) 0.1 MG/GM vaginal cream     . fluticasone (FLONASE) 50 MCG/ACT nasal spray Place 2 sprays into the nose as needed for rhinitis.    Marland Kitchen latanoprost (XALATAN) 0.005 % ophthalmic solution Place 1 drop into both eyes at bedtime.      Marland Kitchen loratadine (CLARITIN) 10 MG tablet Take 10 mg by mouth daily as needed for allergies.    . magnesium oxide (MAG-OX) 400 MG tablet Take 400 mg by mouth daily.    . metoprolol tartrate (LOPRESSOR) 25 MG tablet Take 25 mg by mouth 2 (two) times daily.     . rosuvastatin (CRESTOR) 10 MG tablet      No current facility-administered medications for this visit.     Allergies:   Aspirin and Lactose intolerance (gi)    Social History:  The patient  reports that she has never smoked. She has never used smokeless tobacco. She reports that she does not drink alcohol or use drugs.   Family History:  The patient's family history includes Heart attack in her brother, father, and mother; Hypertension in her father.    ROS: All other  systems are reviewed and negative. Unless otherwise mentioned in H&P    PHYSICAL EXAM: VS:  BP 134/68   Pulse (!) 55   Ht 4\' 11"  (1.499 m)   Wt 106 lb (48.1 kg)   SpO2 97%   BMI 21.41 kg/m  , BMI Body mass index is 21.41 kg/m. GEN: Well nourished, well developed, in no acute distress  HEENT: normal  Neck: no JVD, carotid bruits, or masses Cardiac: RRR; no murmurs, rubs, or gallops,no edema  Respiratory:  Clear to auscultation bilaterally, normal work of breathing GI: soft, nontender, nondistended, + BS MS: no deformity or atrophy  Skin: warm and dry, no rash Neuro:  Strength and sensation are intact Psych: euthymic  mood, full affect   EKG: Normal sinus rhythm sinus bradycardia heart rate 59 bpm, possible left atrial enlargement nonspecific T wave abnormalities noted anterior lateral.  Recent Labs: 06/26/2016: ALT 21 06/28/2016: BUN 14; Creatinine, Ser 0.81; Potassium 4.3; Sodium 135 06/29/2016: Hemoglobin 9.8; Platelets 141    Lipid Panel    Component Value Date/Time   CHOL 165 09/22/2016 1128   TRIG 70 09/22/2016 1128   HDL 79 09/22/2016 1128   CHOLHDL 2.1 09/22/2016 1128   CHOLHDL 1.9 10/30/2015 1405   VLDL 12 10/30/2015 1405   LDLCALC 72 09/22/2016 1128      Wt Readings from Last 3 Encounters:  05/24/17 106 lb (48.1 kg)  09/18/16 101 lb 12.8 oz (46.2 kg)  08/11/16 101 lb 9.6 oz (46.1 kg)      Other studies Reviewed: Echocardiogram 08-Sep-2016 Left ventricle: The cavity size was normal. Systolic function was   normal. The estimated ejection fraction was in the range of 60%   to 65%. Wall motion was normal; there were no regional wall   motion abnormalities. Doppler parameters are consistent with   abnormal left ventricular relaxation (grade 1 diastolic   dysfunction). - Aortic valve: Transvalvular velocity was within the normal range.   There was no stenosis. There was no regurgitation. - Mitral valve: Mildly calcified annulus. Transvalvular velocity   was  within the normal range. There was no evidence for stenosis.   There was trivial regurgitation. - Right ventricle: The cavity size was normal. Wall thickness was   normal. Systolic function was normal. - Atrial septum: No defect or patent foramen ovale was identified   by color flow Doppler. - Tricuspid valve: There was mild regurgitation. - Pulmonic valve: There was moderate regurgitation. - Pulmonary arteries: Systolic pressure was within the normal   range. PA peak pressure: 29 mm Hg (S).   ASSESSMENT AND PLAN:  1.  Coronary artery disease: She has a history of coronary artery bypass grafting  in 2013 (LIMA to LAD, SVG-OM 2, SVG-diagonal, SVG-RCA).  Patient continues on metoprolol, secondary prevention with statin therapy, Crestor 10 mg daily, and aspirin.  At this time ischemic testing will not be completed.  2.  Dyspnea: Associated with frustration, anxiety, and feeling tense.  The patient was able to take a 30-minute walk with her son and complete ADLs on her own without worsening symptoms, or any symptoms of dyspnea.  I will check echocardiogram.  Most recent was in July 2018 and was normal.  If abnormal can consider repeat stress test.  3.  Hypertension: Today blood pressure is well controlled.  She remains on amlodipine 5 mg daily along with metoprolol.  Consider increasing blood pressure in the setting of anxiety and frustration contributing to dyspnea.  4.  Chronic pain in her legs: Uncertain if this is intermittent claudication symptoms because she states that he sometimes has some soreness after she stopped walking.  I am able to palpate dorsalis pedis pulses although not significantly strong.  She is concerned about this and will order ABIs for further evaluation.  I doubt PAD at this time.  5.  Painful sternotomy site with keloid scarring: The patient has been referred to dermatology but is not yet kept that appointment.  Possible topical pain medication can be started at PCP  discretion-Voltoran Gel.  Will defer to PCP for recommendations about this.  6. Hypercholesterolemia: On statin therapy.  Labs per PCP   Current medicines are reviewed at length with the patient today.  Labs/ tests ordered today include:ABI and Echo.   Phill Myron. West Pugh, ANP, AACC   05/24/2017 9:24 AM    [facility] [address], [place], [place] [address] Phone: [phone]; Fax: [phone]

## 2017-05-24 ENCOUNTER — Ambulatory Visit: Payer: Medicare HMO | Admitting: Adult Health

## 2017-05-24 ENCOUNTER — Encounter: Payer: Self-pay | Admitting: Adult Health

## 2017-05-24 VITALS — BP 134/68 | HR 55 | Ht 59.0 in | Wt 106.0 lb

## 2017-05-24 DIAGNOSIS — M79604 Pain in right leg: Secondary | ICD-10-CM

## 2017-05-24 DIAGNOSIS — I2510 Atherosclerotic heart disease of native coronary artery without angina pectoris: Secondary | ICD-10-CM | POA: Diagnosis not present

## 2017-05-24 DIAGNOSIS — M79605 Pain in left leg: Secondary | ICD-10-CM | POA: Diagnosis not present

## 2017-05-24 DIAGNOSIS — I10 Essential (primary) hypertension: Secondary | ICD-10-CM | POA: Diagnosis not present

## 2017-05-24 DIAGNOSIS — R0600 Dyspnea, unspecified: Secondary | ICD-10-CM

## 2017-05-24 DIAGNOSIS — R0609 Other forms of dyspnea: Secondary | ICD-10-CM

## 2017-05-24 DIAGNOSIS — R079 Chest pain, unspecified: Secondary | ICD-10-CM | POA: Diagnosis not present

## 2017-05-24 NOTE — Patient Instructions (Signed)
Medication Instructions:  NO CHANGES- Your physician recommends that you continue on your current medications as directed. Please refer to the Current Medication list given to you today.  If you need a refill on your cardiac medications before your next appointment, please call your pharmacy.  Testing/Procedures: Echocardiogram - Your physician has requested that you have an echocardiogram. Echocardiography is a painless test that uses sound waves to create images of your heart. It provides your doctor with information about the size and shape of your heart and how well your heart's chambers and valves are working. This procedure takes approximately one hour. There are no restrictions for this procedure. This will be performed at our [facility] location - [address], [address].  Your physician has requested that you have an ankle brachial index (ABI). During this test an ultrasound and blood pressure cuff are used to evaluate the arteries that supply the arms and legs with blood. Allow thirty minutes for this exam. There are no restrictions or special instructions.  Special Instructions: PLEASE MAKE SURE TO DISCUSS PAIN ISSUES WITH PRIMARY CARE  Follow-Up: Your physician wants you to follow-up in: [place]    Thank you for choosing [facility] at [facility]!!

## 2017-05-24 NOTE — Progress Notes (Signed)
Thank you MCr 

## 2017-05-25 ENCOUNTER — Other Ambulatory Visit: Payer: Self-pay | Admitting: Adult Health

## 2017-05-25 DIAGNOSIS — I739 Peripheral vascular disease, unspecified: Secondary | ICD-10-CM

## 2017-05-27 ENCOUNTER — Ambulatory Visit (HOSPITAL_COMMUNITY): Payer: Medicare HMO | Attending: Cardiovascular Disease

## 2017-05-27 ENCOUNTER — Other Ambulatory Visit: Payer: Self-pay

## 2017-05-27 DIAGNOSIS — E785 Hyperlipidemia, unspecified: Secondary | ICD-10-CM | POA: Diagnosis not present

## 2017-05-27 DIAGNOSIS — I071 Rheumatic tricuspid insufficiency: Secondary | ICD-10-CM | POA: Diagnosis not present

## 2017-05-27 DIAGNOSIS — I2510 Atherosclerotic heart disease of native coronary artery without angina pectoris: Secondary | ICD-10-CM | POA: Diagnosis not present

## 2017-05-27 DIAGNOSIS — I371 Nonrheumatic pulmonary valve insufficiency: Secondary | ICD-10-CM | POA: Insufficient documentation

## 2017-05-27 DIAGNOSIS — R0600 Dyspnea, unspecified: Secondary | ICD-10-CM

## 2017-05-27 DIAGNOSIS — I10 Essential (primary) hypertension: Secondary | ICD-10-CM | POA: Insufficient documentation

## 2017-05-27 DIAGNOSIS — R0609 Other forms of dyspnea: Secondary | ICD-10-CM | POA: Diagnosis present

## 2017-05-27 DIAGNOSIS — Z951 Presence of aortocoronary bypass graft: Secondary | ICD-10-CM | POA: Insufficient documentation

## 2017-06-01 ENCOUNTER — Ambulatory Visit (HOSPITAL_COMMUNITY)
Admission: RE | Admit: 2017-06-01 | Discharge: 2017-06-01 | Disposition: A | Discharge status: Home / Self Care | Payer: Medicare HMO | Source: Ambulatory Visit | Attending: Cardiology | Admitting: Cardiology

## 2017-06-01 DIAGNOSIS — I739 Peripheral vascular disease, unspecified: Secondary | ICD-10-CM

## 2017-06-01 DIAGNOSIS — M79605 Pain in left leg: Secondary | ICD-10-CM | POA: Insufficient documentation

## 2017-06-01 DIAGNOSIS — M79604 Pain in right leg: Secondary | ICD-10-CM | POA: Diagnosis not present

## 2017-06-03 ENCOUNTER — Other Ambulatory Visit: Payer: Self-pay | Admitting: Internal Medicine

## 2017-06-03 DIAGNOSIS — Z1231 Encounter for screening mammogram for malignant neoplasm of breast: Secondary | ICD-10-CM

## 2017-06-24 ENCOUNTER — Ambulatory Visit
Admission: RE | Admit: 2017-06-24 | Discharge: 2017-06-24 | Disposition: A | Discharge status: Home / Self Care | Payer: Medicare HMO | Source: Ambulatory Visit | Attending: Internal Medicine | Admitting: Internal Medicine

## 2017-06-24 DIAGNOSIS — Z1231 Encounter for screening mammogram for malignant neoplasm of breast: Secondary | ICD-10-CM

## 2017-07-07 ENCOUNTER — Ambulatory Visit: Payer: Self-pay | Admitting: Podiatry

## 2017-07-13 ENCOUNTER — Encounter (INDEPENDENT_AMBULATORY_CARE_PROVIDER_SITE_OTHER): Payer: Self-pay | Admitting: Orthopaedic Surgery

## 2017-07-13 ENCOUNTER — Ambulatory Visit (INDEPENDENT_AMBULATORY_CARE_PROVIDER_SITE_OTHER): Payer: Worker's Compensation

## 2017-07-13 ENCOUNTER — Ambulatory Visit (INDEPENDENT_AMBULATORY_CARE_PROVIDER_SITE_OTHER): Payer: Worker's Compensation | Admitting: Orthopaedic Surgery

## 2017-07-13 DIAGNOSIS — M25552 Pain in left hip: Secondary | ICD-10-CM

## 2017-07-13 DIAGNOSIS — Z96642 Presence of left artificial hip joint: Secondary | ICD-10-CM | POA: Diagnosis not present

## 2017-07-13 NOTE — Progress Notes (Signed)
Post-Op Visit Note   Patient: Theresa Crosby           Date of Birth: 11-02-1932           MRN: [id number] Visit Date: 07/13/2017 PCP: Doretha Sou, MD   Assessment & Plan:  Chief Complaint:  Chief Complaint  Patient presents with  . Left Hip - Follow-up    1 year post left total hip arthroplasty, anterior approach   Visit Diagnoses:  1. Status post left hip replacement   2. Pain in left hip     Plan: Patient is a 84 year old female who presents to our clinic today 13 months status post anterior approach total hip replacement on the left, date of surgery 06/26/2016.  She has been doing okay in regards to the hip since surgery.  She has had issues with sciatica as well as trochanteric bursitis, however.  She is walking quite a bit trying to regain strength.  She does still feel like she is walking with a limp due to the leg length discrepancy.  She did not want to have her shoe built up at [employer] because she did not like how it would work.  She has been using several orthotics instead.  Examination of her left hip reveals a fully healed incision.  Full range of motion of the hip.  4 out of 5 strength with resisted hip flexion.  At this point, the patient would like to attend a little more physical therapy to work more on strength.  We will provide her with a new prescription today.  She will follow-up with [place] in 1 years time for repeat evaluation and x-ray.  Call with concerns or questions in the meantime.    Follow-Up Instructions: Return in about 1 year (around 07/14/2018).   Orders:  Orders Placed This Encounter  Procedures  . XR HIP UNILAT W OR W/O PELVIS 2-3 VIEWS LEFT  . Ambulatory referral to Physical Therapy   No orders of the defined types were placed in this encounter.   Imaging: Xr Hip Unilat W Or W/o Pelvis 2-3 Views Left  Result Date: 07/13/2017 Stable left total hip replacement   PMFS History: Patient Active Problem List   Diagnosis Date Noted    . Pain in left hip 07/13/2017  . Status post left hip replacement 05/10/2017  . Trochanteric bursitis, left hip 03/18/2017  . Sciatica, left side 03/18/2017  . Presence of right artificial hip joint 01/18/2017  . Left displaced femoral neck fracture ([place]) 06/26/2016  . Hypertension, essential 06/26/2016  . HLD (hyperlipidemia) 06/26/2016  . Coronary heart disease 06/26/2016  . Left ventricular diastolic dysfunction 83/66/2947  . Asthma 06/26/2016  . Left shoulder pain 12/10/2014  . Pain in toe of left foot 02/13/2014  . Pain in lower limb 12/18/2013  . Ingrown nail 11/30/2012  . Pain in joint, ankle and foot 08/30/2012  . Onychomycosis due to dermatophyte 05/30/2012  . Chest pain 11/02/2011  . Oral thrush 11/02/2011  . Neuropathy, lower extremities 10/07/2011  . HTN (hypertension) 10/07/2011  . Dyslipidemia 10/07/2011  . Chest pain at rest associated with dyspnea 10/05/2011  . Abnormal nuclear stress test 10/05/2011  . CAD s/p CABG x 4 2013 10/05/2011  . S/P CABG x 4, LIMA-LAD, SVG-OM2, SVG-diag, SVG-RCA  10/04/11 10/05/2011   Past Medical History:  Diagnosis Date  . Abnormal nuclear stress test 10/05/2011  . Anxiety    2005- related to care of aging parent   . Arthritis  degeneration - spondylosis, OA - all over body,   . Blood transfusion    postop- back surg. 1987  . Bunion   . CAD (coronary artery disease)significant diseas w/ 99% LAD, 80% D1, 80% LCX 10/05/2011  . GERD (gastroesophageal reflux disease)   . Hyperlipemia   . Hypertension    ireg. heartbeat, no referral made to cardiologist , sees [place]. Med.   . Neuromuscular disorder (HCC)    burning- L leg , numbing of toes   . S/P CABG x 4, LIMA-LAD, SVG-OM2, SVG-diag, SVG-RCA 10/05/2011  . Shortness of breath     Family History  Problem Relation Age of Onset  . Heart attack Mother   . Heart attack Father   . Hypertension Father   . Heart attack Brother   . Anesthesia problems Neg Hx   . Hypotension Neg Hx    . Malignant hyperthermia Neg Hx   . Pseudochol deficiency Neg Hx     Past Surgical History:  Procedure Laterality Date  . ABDOMINAL HYSTERECTOMY     partial hysterectomy- 1972  . ANTERIOR APPROACH HEMI HIP ARTHROPLASTY Left 06/26/2016   Procedure: ANTERIOR APPROACH TOTAL HIP ARTHROPLASTY;  Surgeon: Leandrew Koyanagi, MD;  Location: [facility];  Service: Orthopedics;  Laterality: Left;  . BACK SURGERY     1987-   . BREAST CYST ASPIRATION Left 06/18/2010  . CARDIAC CATHETERIZATION  10/12/2011   high-grade ca+ prox. LAD diagonal branch,higly diseased diagonal branch & obtuse marginal branch  . CORONARY ARTERY BYPASS GRAFT  10/04/2011   Procedure: CORONARY ARTERY BYPASS GRAFTING (CABG);  Surgeon: Gaye Pollack, MD;  Location: [facility];  Service: Open Heart Surgery;  Laterality: N/A;  CABG x four using left internal mammary artery and right leg greater saphenous vein   . EYE SURGERY     cataracts removed- bilateral, no IOL  . FOOT SURGERY Right January 2013   HT 5  . LEFT HEART CATHETERIZATION WITH CORONARY ANGIOGRAM N/A 10/02/2011   Procedure: LEFT HEART CATHETERIZATION WITH CORONARY ANGIOGRAM;  Surgeon: Lorretta Harp, MD;  Location: [facility] CATH LAB;  Service: Cardiovascular;  Laterality: N/A;  . LUMBAR LAMINECTOMY/DECOMPRESSION MICRODISCECTOMY  03/04/2011   Procedure: LUMBAR LAMINECTOMY/DECOMPRESSION MICRODISCECTOMY;  Surgeon: Hosie Spangle, MD;  Location: [facility]UR[facility]S;  Service: Neurosurgery;  Laterality: N/A;  Thoracic Ten-Thoracic Twelve Thoracic Laminectomy  . NM MYOVIEW LTD  09/15/2011   mild ischemia mid anterior & apical anterior region.EF: 86%  . TOE SURGERY     R foot- removed calus between 5-4  . TUBAL LIGATION     Social History   Occupational History  . Occupation: Was working at [employer]  . Smoking status: Never Smoker  . Smokeless tobacco: Never Used  Substance and Sexual Activity  . Alcohol use: No    Alcohol/week: 0.0 oz  . Drug use: No  .  Sexual activity: Not on file

## 2017-07-21 ENCOUNTER — Ambulatory Visit: Payer: Medicare HMO | Admitting: Podiatry

## 2017-07-21 ENCOUNTER — Ambulatory Visit: Payer: Self-pay | Admitting: Podiatry

## 2017-07-21 DIAGNOSIS — M79671 Pain in right foot: Secondary | ICD-10-CM | POA: Diagnosis not present

## 2017-07-21 DIAGNOSIS — M79672 Pain in left foot: Secondary | ICD-10-CM

## 2017-07-21 DIAGNOSIS — B351 Tinea unguium: Secondary | ICD-10-CM | POA: Diagnosis not present

## 2017-07-21 DIAGNOSIS — L600 Ingrowing nail: Secondary | ICD-10-CM | POA: Diagnosis not present

## 2017-07-21 NOTE — Patient Instructions (Signed)
Seen for hypertrophic nails. All nails debrided. Return in 3 months or as needed.  

## 2017-07-21 NOTE — Progress Notes (Signed)
Subjective: 84 y.o. year old female patient presents complaining of painful nails. Patient requests toe nails trimmed.  Still recovering from Hip surgery done in May 2018.  Objective: Dermatologic: Thick yellow deformed nails x 10. Ingrown hallucal nails bilateral. Vascular: Pedal pulses are all palpable. Orthopedic: Contracted lesser digits bilateral. Neurologic: All epicritic and tactile sensations grossly intact.  Assessment: Dystrophic mycotic nails x 10. Painful ingrown nails. Painful feet.  Treatment: All mycotic nails debrided.  Continue with physical activity, walking exercise. Return in 3 months or as needed.

## 2017-07-22 ENCOUNTER — Encounter: Payer: Self-pay | Admitting: Podiatry

## 2017-08-11 ENCOUNTER — Telehealth: Payer: Self-pay | Admitting: *Deleted

## 2017-08-11 NOTE — Telephone Encounter (Signed)
Received Physician Orders/Plan of Care from [facility] PT; forwarded to provider/SLS 06/26

## 2017-09-22 ENCOUNTER — Telehealth: Payer: Self-pay | Admitting: *Deleted

## 2017-09-28 ENCOUNTER — Ambulatory Visit (INDEPENDENT_AMBULATORY_CARE_PROVIDER_SITE_OTHER): Payer: Worker's Compensation | Admitting: Orthopaedic Surgery

## 2017-09-28 ENCOUNTER — Encounter (INDEPENDENT_AMBULATORY_CARE_PROVIDER_SITE_OTHER): Payer: Self-pay | Admitting: Orthopaedic Surgery

## 2017-09-28 ENCOUNTER — Ambulatory Visit (INDEPENDENT_AMBULATORY_CARE_PROVIDER_SITE_OTHER): Payer: Worker's Compensation

## 2017-09-28 DIAGNOSIS — M545 Low back pain: Secondary | ICD-10-CM

## 2017-09-28 DIAGNOSIS — M79605 Pain in left leg: Secondary | ICD-10-CM

## 2017-09-28 DIAGNOSIS — G8929 Other chronic pain: Secondary | ICD-10-CM

## 2017-09-28 MED ORDER — GABAPENTIN 100 MG PO CAPS: 100.0000 mg | ORAL_CAPSULE | Freq: Three times a day (TID) | ORAL | 3 refills | Status: DC

## 2017-09-28 NOTE — Addendum Note (Signed)
Addended by: Precious Bard on: 09/28/2017 12:31 PM   Modules accepted: Orders

## 2017-09-28 NOTE — Progress Notes (Signed)
Office Visit Note   Patient: Theresa Crosby           Date of Birth: 03-26-1932           MRN: [id number] Visit Date: 09/28/2017              Requested by: Doretha Sou, MD [address] [place], [place] [address] PCP: Doretha Sou, MD   Assessment & Plan: Visit Diagnoses:  1. Pain in left leg     Plan: Overall impression is progression and worsening of her spinal stenosis.  Her x-rays show that she has a stable left total hip replacement.  She does have significant degenerative changes in the lumbar spine along with degenerative scoliosis.  My impression is that it is not coming from her hip and that her lumbar disease has gotten worse.  At this point I recommend repeating her MRI to look for any progression or exacerbation of her underlying problem compared to the MRI that was done in January of last year.  For now we will try low-dose gabapentin to see if this gives her some relief.  We will refer her back to Dr. Sherwood Gambler once the MRI has been completed.  Patient in agreement with the plan. Total face to face encounter time was greater than 25 minutes and over half of this time was spent in counseling and/or coordination of care.  Follow-Up Instructions: Return if symptoms worsen or fail to improve.   Orders:  Orders Placed This Encounter  Procedures  . XR Lumbar Spine 2-3 Views   Meds ordered this encounter  Medications  . gabapentin (NEURONTIN) 100 MG capsule    Sig: Take 1 capsule (100 mg total) by mouth 3 (three) times daily.    Dispense:  30 capsule    Refill:  3      Procedures: No procedures performed   Clinical Data: No additional findings.   Subjective: Chief Complaint  Patient presents with  . Left Leg - Follow-up    Ms. Albertsen follows up today for continued constant and severe left lower extremity pain that radiates from the hip all the way down to the top of the foot and arch.  She is now walking with a cane.  She states  that recently her physical therapy appointments have not given her any relief and has had worsening pain.   Review of Systems  Constitutional: Negative.   HENT: Negative.   Eyes: Negative.   Respiratory: Negative.   Cardiovascular: Negative.   Endocrine: Negative.   Musculoskeletal: Negative.   Neurological: Negative.   Hematological: Negative.   Psychiatric/Behavioral: Negative.   All other systems reviewed and are negative.    Objective: Vital Signs: There were no vitals taken for this visit.  Physical Exam  Constitutional: She is oriented to person, place, and time. She appears well-developed and well-nourished.  Pulmonary/Chest: Effort normal.  Neurological: She is alert and oriented to person, place, and time.  Skin: Skin is warm. Capillary refill takes less than 2 seconds.  Psychiatric: She has a normal mood and affect. Her behavior is normal. Judgment and thought content normal.  Nursing note and vitals reviewed.   Ortho Exam Left hip exam is not concerning for intra-articular issues.  Her exam is consistent with radiculopathy.  She has no focal motor or sensory deficits. Specialty Comments:  No specialty comments available.  Imaging: No results found.   PMFS History: Patient Active Problem List   Diagnosis Date Noted  . Pain  in left hip 07/13/2017  . Status post left hip replacement 05/10/2017  . Trochanteric bursitis, left hip 03/18/2017  . Sciatica, left side 03/18/2017  . Presence of right artificial hip joint 01/18/2017  . Left displaced femoral neck fracture ([place]) 06/26/2016  . Hypertension, essential 06/26/2016  . HLD (hyperlipidemia) 06/26/2016  . Coronary heart disease 06/26/2016  . Left ventricular diastolic dysfunction 55/73/2202  . Asthma 06/26/2016  . Left shoulder pain 12/10/2014  . Pain in toe of left foot 02/13/2014  . Pain in lower limb 12/18/2013  . Ingrown nail 11/30/2012  . Pain in joint, ankle and foot 08/30/2012  . Onychomycosis  due to dermatophyte 05/30/2012  . Chest pain 11/02/2011  . Oral thrush 11/02/2011  . Neuropathy, lower extremities 10/07/2011  . HTN (hypertension) 10/07/2011  . Dyslipidemia 10/07/2011  . Chest pain at rest associated with dyspnea 10/05/2011  . Abnormal nuclear stress test 10/05/2011  . CAD s/p CABG x 4 2013 10/05/2011  . S/P CABG x 4, LIMA-LAD, SVG-OM2, SVG-diag, SVG-RCA  10/04/11 10/05/2011   Past Medical History:  Diagnosis Date  . Abnormal nuclear stress test 10/05/2011  . Anxiety    2005- related to care of aging parent   . Arthritis    degeneration - spondylosis, OA - all over body,   . Blood transfusion    postop- back surg. 1987  . Bunion   . CAD (coronary artery disease)significant diseas w/ 99% LAD, 80% D1, 80% LCX 10/05/2011  . GERD (gastroesophageal reflux disease)   . Hyperlipemia   . Hypertension    ireg. heartbeat, no referral made to cardiologist , sees [place]. Med.   . Neuromuscular disorder (HCC)    burning- L leg , numbing of toes   . S/P CABG x 4, LIMA-LAD, SVG-OM2, SVG-diag, SVG-RCA 10/05/2011  . Shortness of breath     Family History  Problem Relation Age of Onset  . Heart attack Mother   . Heart attack Father   . Hypertension Father   . Heart attack Brother   . Anesthesia problems Neg Hx   . Hypotension Neg Hx   . Malignant hyperthermia Neg Hx   . Pseudochol deficiency Neg Hx     Past Surgical History:  Procedure Laterality Date  . ABDOMINAL HYSTERECTOMY     partial hysterectomy- 1972  . ANTERIOR APPROACH HEMI HIP ARTHROPLASTY Left 06/26/2016   Procedure: ANTERIOR APPROACH TOTAL HIP ARTHROPLASTY;  Surgeon: Leandrew Koyanagi, MD;  Location: [facility] [facility];  Service: Orthopedics;  Laterality: Left;  . BACK SURGERY     1987-   . BREAST CYST ASPIRATION Left 06/18/2010  . CARDIAC CATHETERIZATION  10/12/2011   high-grade ca+ prox. LAD diagonal branch,higly diseased diagonal branch & obtuse marginal branch  . CORONARY ARTERY BYPASS GRAFT  10/04/2011   Procedure:  CORONARY ARTERY BYPASS GRAFTING (CABG);  Surgeon: Gaye Pollack, MD;  Location: [facility];  Service: Open Heart Surgery;  Laterality: N/A;  CABG x four using left internal mammary artery and right leg greater saphenous vein   . EYE SURGERY     cataracts removed- bilateral, no IOL  . FOOT SURGERY Right January 2013   HT 5  . LEFT HEART CATHETERIZATION WITH CORONARY ANGIOGRAM N/A 10/02/2011   Procedure: LEFT HEART CATHETERIZATION WITH CORONARY ANGIOGRAM;  Surgeon: Lorretta Harp, MD;  Location: [facility] CATH LAB;  Service: Cardiovascular;  Laterality: N/A;  . LUMBAR LAMINECTOMY/DECOMPRESSION MICRODISCECTOMY  03/04/2011   Procedure: LUMBAR LAMINECTOMY/DECOMPRESSION MICRODISCECTOMY;  Surgeon: Hosie Spangle, MD;  Location: [facility]  NEURO ORS;  Service: Neurosurgery;  Laterality: N/A;  Thoracic Ten-Thoracic Twelve Thoracic Laminectomy  . NM MYOVIEW LTD  09/15/2011   mild ischemia mid anterior & apical anterior region.EF: 86%  . TOE SURGERY     R foot- removed calus between 5-4  . TUBAL LIGATION     Social History   Occupational History  . Occupation: Was working at [employer]  . Smoking status: Never Smoker  . Smokeless tobacco: Never Used  Substance and Sexual Activity  . Alcohol use: No    Alcohol/week: 0.0 standard drinks  . Drug use: No  . Sexual activity: Not on file

## 2017-10-04 ENCOUNTER — Ambulatory Visit: Payer: Medicare HMO | Admitting: Cardiovascular Disease

## 2017-10-04 ENCOUNTER — Encounter: Payer: Self-pay | Admitting: Cardiovascular Disease

## 2017-10-04 VITALS — BP 136/78 | HR 55 | Ht 59.0 in | Wt 100.2 lb

## 2017-10-04 DIAGNOSIS — I10 Essential (primary) hypertension: Secondary | ICD-10-CM

## 2017-10-04 DIAGNOSIS — I2510 Atherosclerotic heart disease of native coronary artery without angina pectoris: Secondary | ICD-10-CM

## 2017-10-04 DIAGNOSIS — E7800 Pure hypercholesterolemia, unspecified: Secondary | ICD-10-CM

## 2017-10-04 MED ORDER — ROSUVASTATIN CALCIUM 10 MG PO TABS: 10.0000 mg | ORAL_TABLET | Freq: Every day | ORAL | 3 refills | Status: DC

## 2017-10-04 NOTE — Progress Notes (Signed)
Cardiology Office Note    Date:  10/05/2017   ID:  Theresa, Crosby 31-Dec-1932, MRN [id number]  PCP:  [place]  Cardiologist:   Sanda Klein, MD   Chief Complaint  Patient presents with  . Coronary Artery Disease    History of Present Illness:  Theresa Crosby is a 84 y.o. female with coronary artery disease status post bypass surgery in 2013 ([place], LIMA to LAD, SVG-OM 2, SVG-diagonal, SVG-RCA , August 2013), hypertension and hyperlipidemia returning for follow-up.   Repeat echo in April 2019 showed grade 2 diastolic dysfunction:  - Left ventricle: The cavity size was normal. Systolic function was normal. The estimated ejection fraction was in the range of 60%   to 65%. Wall motion was normal; there were no regional wall motion abnormalities. Features are consistent with a pseudonormal   left ventricular filling pattern, with concomitant abnormal relaxation and increased filling pressure (grade 2 diastolic dysfunction). Doppler parameters are consistent with high ventricular filling pressure. ... - PA peak pressure: 34 mm Hg (S).  His complaints dose around the problems with her left hip.  Since her fall and total hip replacement she has not felt well.  She is troubled by the fact that her legs are unequal in length.  It sounds like she is not communicating well with orthopedic surgeon.  She has advised her that she has some problems with her back causing the pain, but I do not think she believes him.  This is despite the fact that she has long-standing problems with back pain (has undergone lumbar spine stenosis in 1985 and 2013). She is planning to seek another opinion.    She also continues to have some stinging pain at the keloid scar from her sternotomy. She has seen 2 different dermatologists left performed injections of the scar without any benefit, actually with development of new keloid.  The patient specifically denies any chest pain at rest  exertion, dyspnea at rest or with exertion, orthopnea, paroxysmal nocturnal dyspnea, syncope, palpitations, focal neurological deficits, intermittent claudication, lower extremity edema, unexplained weight gain, cough, hemoptysis or wheezing.  Because of her hip problems she is walking less and she believes this is made her to gain weight.  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  Her hip fracture was apparently preceded by syncope, we performed an event monitor and an echocardiogram. Left ventricular systolic function was normal and the monitor did not show significant arrhythmia. She has not had recurrent syncope.  She reports compliance with rosuvastatin.  Her amlodipine was stopped about 2 months ago and her blood pressure has remained in normal range.   Past Medical History:  Diagnosis Date  . Abnormal nuclear stress test 10/05/2011  . Anxiety    2005- related to care of aging parent   . Arthritis    degeneration - spondylosis, OA - all over body,   . Blood transfusion    postop- back surg. 1987  . Bunion   . CAD (coronary artery disease)significant diseas w/ 99% LAD, 80% D1, 80% LCX 10/05/2011  . GERD (gastroesophageal reflux disease)   . Hyperlipemia   . Hypertension    ireg. heartbeat, no referral made to cardiologist , sees [place]. Med.   . Neuromuscular disorder (HCC)    burning- L leg , numbing of toes   . S/P CABG x 4, LIMA-LAD, SVG-OM2, SVG-diag, SVG-RCA 10/05/2011  . Shortness of breath     Past Surgical History:  Procedure Laterality  Date  . ABDOMINAL HYSTERECTOMY     partial hysterectomy- 1972  . ANTERIOR APPROACH HEMI HIP ARTHROPLASTY Left 06/26/2016   Procedure: ANTERIOR APPROACH TOTAL HIP ARTHROPLASTY;  Surgeon: Leandrew Koyanagi, MD;  Location: [place];  Service: Orthopedics;  Laterality: Left;  . BACK SURGERY     1987-   . BREAST CYST ASPIRATION Left 06/18/2010  . CARDIAC CATHETERIZATION  10/12/2011   high-grade ca+ prox. LAD diagonal branch,higly diseased  diagonal branch & obtuse marginal branch  . CORONARY ARTERY BYPASS GRAFT  10/04/2011   Procedure: CORONARY ARTERY BYPASS GRAFTING (CABG);  Surgeon: Gaye Pollack, MD;  Location: [place];  Service: Open Heart Surgery;  Laterality: N/A;  CABG x four using left internal mammary artery and right leg greater saphenous vein   . EYE SURGERY     cataracts removed- bilateral, no IOL  . FOOT SURGERY Right January 2013   HT 5  . LEFT HEART CATHETERIZATION WITH CORONARY ANGIOGRAM N/A 10/02/2011   Procedure: LEFT HEART CATHETERIZATION WITH CORONARY ANGIOGRAM;  Surgeon: Lorretta Harp, MD;  Location: [facility] CATH LAB;  Service: Cardiovascular;  Laterality: N/A;  . LUMBAR LAMINECTOMY/DECOMPRESSION MICRODISCECTOMY  03/04/2011   Procedure: LUMBAR LAMINECTOMY/DECOMPRESSION MICRODISCECTOMY;  Surgeon: Hosie Spangle, MD;  Location: [place] NEURO ORS;  Service: Neurosurgery;  Laterality: N/A;  Thoracic Ten-Thoracic Twelve Thoracic Laminectomy  . NM MYOVIEW LTD  09/15/2011   mild ischemia mid anterior & apical anterior region.EF: 86%  . TOE SURGERY     R foot- removed calus between 5-4  . TUBAL LIGATION      Current Medications: Outpatient Medications Prior to Visit  Medication Sig Dispense Refill  . acetaminophen (TYLENOL) 325 MG tablet Take 1 or 2 tablets every 6 hours as needed for pain    . ADVAIR DISKUS 100-50 MCG/DOSE AEPB USE 1 PUFF EVERY 12 HOURS  0  . albuterol (PROVENTIL HFA;VENTOLIN HFA) 108 (90 BASE) MCG/ACT inhaler Inhale 1-2 puffs into the lungs every 6 (six) hours as needed for wheezing or shortness of breath.    Marland Kitchen aspirin EC 81 MG tablet Take 1 tablet (81 mg total) by mouth daily. 100 tablet 0  . Cholecalciferol (VITAMIN D3) 1000 UNITS CAPS Take 2,000 Units by mouth every morning.      . dorzolamide-timolol (COSOPT) 22.3-6.8 MG/ML ophthalmic solution Place 1 drop into both eyes 2 (two) times daily.  5  . estradiol (ESTRACE) 0.1 MG/GM vaginal cream     . fluticasone (FLONASE) 50 MCG/ACT nasal spray Place  2 sprays into the nose as needed for rhinitis.    Marland Kitchen latanoprost (XALATAN) 0.005 % ophthalmic solution Place 1 drop into both eyes at bedtime.      Marland Kitchen loratadine (CLARITIN) 10 MG tablet Take 10 mg by mouth daily as needed for allergies.    . magnesium oxide (MAG-OX) 400 MG tablet Take 400 mg by mouth daily.    . metoprolol tartrate (LOPRESSOR) 25 MG tablet Take 25 mg by mouth 2 (two) times daily.     Marland Kitchen alendronate (FOSAMAX) 35 MG tablet TAKE 1 TAB BY MOUTH EVERY 7DAY TAKE MED ON EMPTY STOMACH W/FULL GLASS OF WATER/DO NOT LIE DOWN 30MIN  0  . amLODipine (NORVASC) 5 MG tablet Take 5 mg by mouth daily.      Marland Kitchen gabapentin (NEURONTIN) 100 MG capsule Take 1 capsule (100 mg total) by mouth 3 (three) times daily. (Patient not taking: Reported on 10/04/2017) 30 capsule 3  . rosuvastatin (CRESTOR) 10 MG tablet  No facility-administered medications prior to visit.      Allergies:   Aspirin and Lactose intolerance (gi)   Social History   Socioeconomic History  . Marital status: Widowed    Spouse name: Not on file  . Number of children: Not on file  . Years of education: Not on file  . Highest education level: Not on file  Occupational History  . Occupation: Was working at [employer]  . Financial resource strain: Not on file  . Food insecurity:    Worry: Not on file    Inability: Not on file  . Transportation needs:    Medical: Not on file    Non-medical: Not on file  Tobacco Use  . Smoking status: Never Smoker  . Smokeless tobacco: Never Used  Substance and Sexual Activity  . Alcohol use: No    Alcohol/week: 0.0 standard drinks  . Drug use: No  . Sexual activity: Not on file  Lifestyle  . Physical activity:    Days per week: Not on file    Minutes per session: Not on file  . Stress: Not on file  Relationships  . Social connections:    Talks on phone: Not on file    Gets together: Not on file    Attends religious service: Not on file    Active member  of club or organization: Not on file    Attends meetings of clubs or organizations: Not on file    Relationship status: Not on file  Other Topics Concern  . Not on file  Social History Narrative   Lives alone in an apartment.      Family History:  The patient's \ family history includes Heart attack in her brother, father, and mother; Hypertension in her father.   ROS:   Please see the history of present illness.    ROS all other systems are reviewed and are negative  PHYSICAL EXAM:   VS:  BP 136/78   Pulse (!) 55   Ht 4\' 11"  (1.499 m)   Wt 100 lb 3.2 oz (45.5 kg)   BMI 20.24 kg/m    General: Alert, oriented x3, no distress, remains very lean Head: no evidence of trauma, PERRL, EOMI, no exophtalmos or lid lag, no myxedema, no xanthelasma; normal ears, nose and oropharynx Neck: normal jugular venous pulsations and no hepatojugular reflux; brisk carotid pulses without delay and no carotid bruits Chest: clear to auscultation, no signs of consolidation by percussion or palpation, normal fremitus, symmetrical and full respiratory excursions Cardiovascular: normal position and quality of the apical impulse, regular rhythm, normal first and second heart sounds, no murmurs, rubs or gallops Abdomen: no tenderness or distention, no masses by palpation, no abnormal pulsatility or arterial bruits, normal bowel sounds, no hepatosplenomegaly Extremities: no clubbing, cyanosis or edema; 2+ radial, ulnar and brachial pulses bilaterally; 2+ right femoral, posterior tibial and dorsalis pedis pulses; 2+ left femoral, posterior tibial and dorsalis pedis pulses; no subclavian or femoral bruits Neurological: grossly nonfocal Psych: Normal mood and affect  Wt Readings from Last 3 Encounters:  10/04/17 100 lb 3.2 oz (45.5 kg)  05/24/17 106 lb (48.1 kg)  09/18/16 101 lb 12.8 oz (46.2 kg)      Studies/Labs Reviewed:   EKG:  EKG is  ordered today.  The ekg ordered today demonstrates sinus bradycardia  with left atrial abnormality, QTC 422 ms  Recent Labs: No results found for requested labs within last 8760 hours.   Lipid  Panel    Component Value Date/Time   CHOL 165 09/22/2016 1128   TRIG 70 09/22/2016 1128   HDL 79 09/22/2016 1128   CHOLHDL 2.1 09/22/2016 1128   CHOLHDL 1.9 10/30/2015 1405   VLDL 12 10/30/2015 1405   LDLCALC 72 09/22/2016 1128     ASSESSMENT:    1. Coronary artery disease involving native coronary artery of native heart without angina pectoris   2. Hypercholesterolemia   3. Essential hypertension      PLAN:  In order of problems listed above:  1. CAD: Does not have angina pectoris and her risk factors are well addressed. 2. HLP: On statin with LDL very close to target of 70 or less.  Reinforced the fact that the statin (or similar type of lipid-lowering therapy) will be a lifelong medication for her. 3. HTN: Minimally elevated blood pressure.  No changes made to her medications.  At home she reports her systolic blood pressure is typically around 120.    Medication Adjustments/Labs and Tests Ordered: Current medicines are reviewed at length with the patient today.  Concerns regarding medicines are outlined above.  Medication changes, Labs and Tests ordered today are listed in the Patient Instructions below. Patient Instructions  Dr Sallyanne Kuster has recommended making the following medication changes: 1. RESTART Rosuvastatin (Crestor) 10 mg daily  Your physician recommends that you schedule a follow-up appointment in 12 months. You will receive a reminder letter in the mail two months in advance. If you don't receive a letter, please call our office to schedule the follow-up appointment.  If you need a refill on your cardiac medications before your next appointment, please call your pharmacy.    Signed, Sanda Klein, MD  10/05/2017 7:48 PM    [address], [place], [place]  [address] Phone: [phone]; Fax:  [phone]

## 2017-10-04 NOTE — Patient Instructions (Signed)
Dr Sallyanne Kuster has recommended making the following medication changes: 1. RESTART Rosuvastatin (Crestor) 10 mg daily  Your physician recommends that you schedule a follow-up appointment in 12 months. You will receive a reminder letter in the mail two months in advance. If you don't receive a letter, please call our office to schedule the follow-up appointment.  If you need a refill on your cardiac medications before your next appointment, please call your pharmacy.

## 2017-10-05 ENCOUNTER — Encounter: Payer: Self-pay | Admitting: Cardiovascular Disease

## 2017-10-21 ENCOUNTER — Encounter: Payer: Self-pay | Admitting: Podiatry

## 2017-10-21 ENCOUNTER — Ambulatory Visit: Payer: Medicare HMO | Admitting: Podiatry

## 2017-10-21 DIAGNOSIS — M79672 Pain in left foot: Secondary | ICD-10-CM | POA: Diagnosis not present

## 2017-10-21 DIAGNOSIS — L600 Ingrowing nail: Secondary | ICD-10-CM | POA: Diagnosis not present

## 2017-10-21 DIAGNOSIS — B351 Tinea unguium: Secondary | ICD-10-CM

## 2017-10-21 DIAGNOSIS — M79671 Pain in right foot: Secondary | ICD-10-CM

## 2017-10-21 NOTE — Patient Instructions (Signed)
Seen for hypertrophic nails. All nails debrided. Return in 3 months or as needed.  

## 2017-10-21 NOTE — Progress Notes (Signed)
Subjective: 84 y.o. year old female patient presents complaining of painful nails. Patient requests toe nails trimmed.  Her left side hip is still hurting since the surgery in May 2018. She fell and injured her hip while at work. Having difficulty walking and move about due to hip pain.  Objective: Dermatologic: Thick yellow deformed nails x 10. Ingrown hallucal nails bilateral. No acute edema or erythema noted. Vascular: Pedal pulses are faintly palpable. Both feet are cold to touch. Orthopedic: Contracted lesser digits bilateral. Neurologic: All epicritic and tactile sensations grossly intact.  Assessment: Dystrophic mycotic nails x 10. Painful ingrown nails. Painful feet. Difficulty walking.  Treatment: All mycotic nails debrided.  May benefit from home health nurse to help her out some chores that she is not able to do since the hip injury. Return in 3 months or sooner if needed.

## 2017-11-09 ENCOUNTER — Telehealth (INDEPENDENT_AMBULATORY_CARE_PROVIDER_SITE_OTHER): Payer: Self-pay | Admitting: *Deleted

## 2017-11-09 ENCOUNTER — Encounter (INDEPENDENT_AMBULATORY_CARE_PROVIDER_SITE_OTHER): Payer: Self-pay

## 2017-11-09 NOTE — Telephone Encounter (Signed)
Letter made.

## 2017-11-09 NOTE — Telephone Encounter (Signed)
Can you please call patient to let her know letter ready for pick up here in our office please. Thanks.

## 2017-11-09 NOTE — Telephone Encounter (Signed)
Please write letter stating that I need MRI to evaluate for her left hip pain and that so far work up with her hip has been negative.

## 2017-11-09 NOTE — Telephone Encounter (Signed)
Received call from [place] with [facility] stating the adjustor will not authorize MRI LSP wo letter from Dr. Erlinda Hong stating that pt needs MRI to diagnose where pain in L hip is coming from or what is rulling out, needs clarification from DOS where Dr. Erlinda Hong ordered MRI is not clear to tx L hip.   Please fax letter to [phone] Josiah Lobo or if any questions please call [phone]

## 2017-11-09 NOTE — Telephone Encounter (Signed)
See message below °

## 2017-11-11 ENCOUNTER — Telehealth: Payer: Self-pay | Admitting: Cardiovascular Disease

## 2017-11-11 NOTE — Telephone Encounter (Signed)
New Message:     Patient was prescribe the medication gabapentin and would like an alternative medication

## 2017-11-11 NOTE — Telephone Encounter (Signed)
Please advise, patient was called and asked why she wanted to switch from the gabapentin. Patient stated that it makes her feel fatigued/dizzy, and she read that it has a lot of side effects that she is not comfortable taking. She also would like to know if she can take Aleve now instead of Tylenol as the Tylenol does nothing for her pain.  She would like your opinion. Thank you!

## 2017-11-11 NOTE — Telephone Encounter (Signed)
I cannot really give any advice regarding the gabapentin and alternative Rx for neuropathic pain. She should ask her PCP or neurologist . She can take Aleve. It has a higher risk of GI bleeding and a slightly higher risk of MI compared to acetaminophen, but the risk is not prohibitive, and if it improves her QOL it is probably worth it. MCr

## 2017-11-12 NOTE — Telephone Encounter (Signed)
Called patient, gave message from Dr.Croitoru.  Patient verbalized understanding.

## 2017-11-16 NOTE — Telephone Encounter (Signed)
Fax letter to Atty see message below

## 2017-11-16 NOTE — Telephone Encounter (Signed)
FAXED NOTE

## 2017-12-01 ENCOUNTER — Telehealth (INDEPENDENT_AMBULATORY_CARE_PROVIDER_SITE_OTHER): Payer: Self-pay | Admitting: Orthopaedic Surgery

## 2017-12-01 ENCOUNTER — Other Ambulatory Visit (INDEPENDENT_AMBULATORY_CARE_PROVIDER_SITE_OTHER): Payer: Self-pay | Admitting: Orthopaedic Surgery

## 2017-12-01 DIAGNOSIS — M79605 Pain in left leg: Secondary | ICD-10-CM

## 2017-12-01 DIAGNOSIS — M545 Low back pain: Secondary | ICD-10-CM

## 2017-12-01 DIAGNOSIS — G8929 Other chronic pain: Secondary | ICD-10-CM

## 2017-12-01 DIAGNOSIS — M5440 Lumbago with sciatica, unspecified side: Secondary | ICD-10-CM

## 2017-12-01 NOTE — Telephone Encounter (Signed)
Caroline from [employer] called in reference to the patient's MRI and getting it authorized.  Chrys Racer would like for [place] to call the adjustor [facility]) [phone], she is in the office until 3:45.  Chrys Racer requests a written authorization from the adjustor for this patient.  Caroline's CB#[phone] ask for her.  Thank you.

## 2017-12-01 NOTE — Telephone Encounter (Signed)
I called Theresa Crosby back. She states that adj is stating she left a voicemail with our office stating MRI is approved but she doesn't know who with. Tyrone Sage that she can adv adjustor to email me the auth and then we will proceed with scheduling. Theresa Crosby thinks it will need to be set up through one call. She will adv adj to let me know if it does for sure.

## 2017-12-01 NOTE — Telephone Encounter (Signed)
See message.

## 2017-12-02 NOTE — Telephone Encounter (Signed)
Theresa Crosby called back to check status. Still no written auth received. She is going to call adj again and ask her to email me the auth. Went ahead and submitted through one call because they will reach out to adj as well to verify approval.

## 2017-12-28 ENCOUNTER — Telehealth (INDEPENDENT_AMBULATORY_CARE_PROVIDER_SITE_OTHER): Payer: Self-pay | Admitting: Orthopaedic Surgery

## 2017-12-30 ENCOUNTER — Ambulatory Visit (INDEPENDENT_AMBULATORY_CARE_PROVIDER_SITE_OTHER): Payer: Worker's Compensation | Admitting: Orthopaedic Surgery

## 2017-12-30 DIAGNOSIS — Z96642 Presence of left artificial hip joint: Secondary | ICD-10-CM

## 2017-12-30 NOTE — Progress Notes (Addendum)
Office Visit Note   Patient: Theresa Crosby           Date of Birth: 25-Jun-1932           MRN: [id number] Visit Date: 12/30/2017              Requested by: [facility], [facility] [address] [place], [place] [address] PCP: Elim: Visit Diagnoses:  1. Status post left hip replacement     Plan: Today we had a very long discussion about the MRI results, leg length discrepancy and why I needed to make her leg slightly longer in order to impart stability to the hip joint to prevent dislocation.  I have tried everything I can to explain to her why I had to do this and the mechanism of how a hip joint is more stable with increased muscular tension.  I have shown her on a model and picture.  I have explained this at almost every one of our previous office visits but I get the sense that she is not able to fully comprehend.  I have tried everything I can to help her understand.  I have recommended a lumbar spine ESI given the fact that the lumbar MRI explains her symptoms very well but patient refuses to undergo an ESI.  Patient is demanding an ultrasound and CT scan of her left hip.  I discussed that the ultrasound and CT scan of her left hip are not medically indicated.  She is also convinced that her leg length discrepancy is causing her to have frequent urination and bladder symptoms which I tried to reassure her that they were unrelated.  She is not convinced by my attempts to reassure her.  At this point I feel that I have no more to offer her since she is not willing to undergo any treatments that I have recommended.  I have offered to refer her to Dr. Sherwood Gambler for her severe spinal stenosis and degenerative scoliosis.  Her hip replacement is without complication.  She has a minimal leg length discrepancy that I do not believe is a cause of her symptoms and she has an L spine MRI that explains her symptoms very well.  I think that it is best that she seek  a second opinion which I am in favor of so that she can better understand what is going on. Total face to face encounter time was greater than 25 minutes and over half of this time was spent in counseling and/or coordination of care.  Follow-Up Instructions: Return if symptoms worsen or fail to improve.   Orders:  No orders of the defined types were placed in this encounter.  No orders of the defined types were placed in this encounter.     Procedures: No procedures performed   Clinical Data: No additional findings.   Subjective: Chief Complaint  Patient presents with  . Lower Back - Follow-up    Post MRI    Theresa Crosby is following up today to review her lumbar spine MRI and continued left lower extremity radicular pain.  She has tried gabapentin but she could not tolerate the side effects.  We have performed trochanteric bursal injections without significant relief.  She has done extensive physical therapy without significant relief.  Understandably she is very upset that she is reporting that her hip feels this way because of her leg length discrepancy and according to her theory this is causing increased tension on  her hip joint which is also causing frequent urination.   Review of Systems   Objective: Vital Signs: There were no vitals taken for this visit.  Physical Exam  Ortho Exam Left lower extremity exam shows a fully healed surgical scar.  She has painless rotation of her hip.  She has no sciatic tension signs.  Her left leg is approximately 3 mm longer than the right leg. Specialty Comments:  No specialty comments available.  Imaging: No results found.   PMFS History: Patient Active Problem List   Diagnosis Date Noted  . Pain in left hip 07/13/2017  . Status post left hip replacement 05/10/2017  . Trochanteric bursitis, left hip 03/18/2017  . Sciatica, left side 03/18/2017  . Presence of right artificial hip joint 01/18/2017  . Left displaced femoral neck  fracture ([place]) 06/26/2016  . Hypertension, essential 06/26/2016  . HLD (hyperlipidemia) 06/26/2016  . Coronary heart disease 06/26/2016  . Left ventricular diastolic dysfunction 19/62/2297  . Asthma 06/26/2016  . Left shoulder pain 12/10/2014  . Pain in toe of left foot 02/13/2014  . Pain in lower limb 12/18/2013  . Ingrown nail 11/30/2012  . Pain in joint, ankle and foot 08/30/2012  . Onychomycosis due to dermatophyte 05/30/2012  . Chest pain 11/02/2011  . Oral thrush 11/02/2011  . Neuropathy, lower extremities 10/07/2011  . HTN (hypertension) 10/07/2011  . Dyslipidemia 10/07/2011  . Chest pain at rest associated with dyspnea 10/05/2011  . Abnormal nuclear stress test 10/05/2011  . CAD s/p CABG x 4 2013 10/05/2011  . S/P CABG x 4, LIMA-LAD, SVG-OM2, SVG-diag, SVG-RCA  10/04/11 10/05/2011   Past Medical History:  Diagnosis Date  . Abnormal nuclear stress test 10/05/2011  . Anxiety    2005- related to care of aging parent   . Arthritis    degeneration - spondylosis, OA - all over body,   . Blood transfusion    postop- back surg. 1987  . Bunion   . CAD (coronary artery disease)significant diseas w/ 99% LAD, 80% D1, 80% LCX 10/05/2011  . GERD (gastroesophageal reflux disease)   . Hyperlipemia   . Hypertension    ireg. heartbeat, no referral made to cardiologist , sees [place]. Med.   . Neuromuscular disorder (HCC)    burning- L leg , numbing of toes   . S/P CABG x 4, LIMA-LAD, SVG-OM2, SVG-diag, SVG-RCA 10/05/2011  . Shortness of breath     Family History  Problem Relation Age of Onset  . Heart attack Mother   . Heart attack Father   . Hypertension Father   . Heart attack Brother   . Anesthesia problems Neg Hx   . Hypotension Neg Hx   . Malignant hyperthermia Neg Hx   . Pseudochol deficiency Neg Hx     Past Surgical History:  Procedure Laterality Date  . ABDOMINAL HYSTERECTOMY     partial hysterectomy- 1972  . ANTERIOR APPROACH HEMI HIP ARTHROPLASTY Left 06/26/2016    Procedure: ANTERIOR APPROACH TOTAL HIP ARTHROPLASTY;  Surgeon: Leandrew Koyanagi, MD;  Location: [facility];  Service: Orthopedics;  Laterality: Left;  . BACK SURGERY     1987-   . BREAST CYST ASPIRATION Left 06/18/2010  . CARDIAC CATHETERIZATION  10/12/2011   high-grade ca+ prox. LAD diagonal branch,higly diseased diagonal branch & obtuse marginal branch  . CORONARY ARTERY BYPASS GRAFT  10/04/2011   Procedure: CORONARY ARTERY BYPASS GRAFTING (CABG);  Surgeon: Gaye Pollack, MD;  Location: [facility];  Service: Open Heart Surgery;  Laterality:  N/A;  CABG x four using left internal mammary artery and right leg greater saphenous vein   . EYE SURGERY     cataracts removed- bilateral, no IOL  . FOOT SURGERY Right January 2013   HT 5  . LEFT HEART CATHETERIZATION WITH CORONARY ANGIOGRAM N/A 10/02/2011   Procedure: LEFT HEART CATHETERIZATION WITH CORONARY ANGIOGRAM;  Surgeon: Lorretta Harp, MD;  [facility] C[facility] LAB;  Service: Cardiovascular;  Laterality: N/A;  . LUMBAR LAMINECTOMY/DECOMPRESSION MICRODISCECTOMY  03/04/2011   Procedure: LUMBAR LAMINECTOMY/DECOMPRESSION MICRODISCECTOMY;  Surgeon: Hosie Spangle, MD;  Location: [facility];  Service: Neurosurgery;  Laterality: N/A;  Thoracic Ten-Thoracic Twelve Thoracic Laminectomy  . NM MYOVIEW LTD  09/15/2011   mild ischemia mid anterior & apical anterior region.EF: 86%  . TOE SURGERY     R foot- removed calus between 5-4  . TUBAL LIGATION     Social History   Occupational History  . Occupation: Was working at [employer]  . Smoking status: Never Smoker  . Smokeless tobacco: Never Used  Substance and Sexual Activity  . Alcohol use: No    Alcohol/week: 0.0 standard drinks  . Drug use: No  . Sexual activity: Not on file

## 2018-01-11 ENCOUNTER — Telehealth (INDEPENDENT_AMBULATORY_CARE_PROVIDER_SITE_OTHER): Payer: Self-pay | Admitting: Orthopaedic Surgery

## 2018-01-11 NOTE — Telephone Encounter (Signed)
Received vm from [facility] w/ [facility] checking if we received their request for records. I called her back [phone], lm in general mailbox advised that we have not received a request since 8/30. I left our fax number for to re send

## 2018-01-17 ENCOUNTER — Telehealth (INDEPENDENT_AMBULATORY_CARE_PROVIDER_SITE_OTHER): Payer: Self-pay

## 2018-01-17 NOTE — Telephone Encounter (Signed)
Emailed the 12/30/17 office note to the wc adj per her request

## 2018-01-20 ENCOUNTER — Ambulatory Visit: Payer: Self-pay | Admitting: Podiatry

## 2018-06-30 NOTE — Telephone Encounter (Signed)
error 

## 2018-09-09 ENCOUNTER — Telehealth: Payer: Self-pay | Admitting: Physician Assistant

## 2018-09-09 NOTE — Telephone Encounter (Signed)
LM, reminding pt of her appt on 09-12-18 with Almyra Deforest. I also pt to call  back to get pre-screened for COVID-19.

## 2018-09-12 ENCOUNTER — Encounter: Payer: Self-pay | Admitting: Physician Assistant

## 2018-09-12 ENCOUNTER — Other Ambulatory Visit: Payer: Self-pay

## 2018-09-12 ENCOUNTER — Ambulatory Visit (INDEPENDENT_AMBULATORY_CARE_PROVIDER_SITE_OTHER): Payer: Medicare HMO | Admitting: Physician Assistant

## 2018-09-12 ENCOUNTER — Telehealth: Payer: Self-pay | Admitting: Physician Assistant

## 2018-09-12 VITALS — BP 148/64 | HR 63 | Ht 59.0 in | Wt 99.6 lb

## 2018-09-12 DIAGNOSIS — I25709 Atherosclerosis of coronary artery bypass graft(s), unspecified, with unspecified angina pectoris: Secondary | ICD-10-CM | POA: Diagnosis not present

## 2018-09-12 DIAGNOSIS — R0789 Other chest pain: Secondary | ICD-10-CM | POA: Diagnosis not present

## 2018-09-12 DIAGNOSIS — Z96649 Presence of unspecified artificial hip joint: Secondary | ICD-10-CM

## 2018-09-12 DIAGNOSIS — R42 Dizziness and giddiness: Secondary | ICD-10-CM

## 2018-09-12 DIAGNOSIS — I10 Essential (primary) hypertension: Secondary | ICD-10-CM

## 2018-09-12 DIAGNOSIS — E7849 Other hyperlipidemia: Secondary | ICD-10-CM

## 2018-09-12 DIAGNOSIS — R079 Chest pain, unspecified: Secondary | ICD-10-CM

## 2018-09-12 DIAGNOSIS — T8459XD Infection and inflammatory reaction due to other internal joint prosthesis, subsequent encounter: Secondary | ICD-10-CM

## 2018-09-12 NOTE — Progress Notes (Signed)
Cardiology Office Note    Date:  09/12/2018   ID:  Theresa, Crosby [birth year], MRN [id number]  PCP:  Francesca Oman, DO  Cardiologist:  Dr. Sallyanne Kuster   No chief complaint on file.   History of Present Illness:  Theresa Crosby is a 85 y.o. female with PMH of CAD s/p CABG 2013 (LIMA-LAD, SVG-OM2, SVG-Diagonal, SVG-RCA), HTN, and HLD.  Last Myoview obtained on 02/06/2015 showed EF 87%, small defect of mild severity present in the apex location, overall low risk study.  Previous heart monitor obtained on 08/26/2016 showed normal sinus rhythm, very rare PACs, rare relatively little increase in the heart rate during the day concerning for beta-blocker induced chronotropic incompetence.  She had symptoms of heart flutter that occurred in association with normal sinus rhythm.  Echocardiogram in April 2019 showed grade 2 DD, EF 60 to 65%, peak PA pressure 34 mmHg.  She was last seen by Dr. Sallyanne Kuster in August 2019 at which time she was doing well.   More recently, she has been having some issues with prosthetic hip infection that is being treated by [facility].  She had a PICC line placed and underwent IV antibiotic and later transitioned to oral antibiotic with doxycycline.  She denies any significant hip pain and can walk 45 minutes to the nearest bus station without any issue.  In the past several weeks, she has been noticing some chest pain at rest that last only a few minutes.  Symptoms tend to occur about 1-2 times per week.  She typically noticed the symptom when she is sitting down or laying down.  She still has some chronic dizzy spell when she suddenly turned her head or suddenly raise her head.  She was previously on amlodipine 5 mg daily, however her primary care provider has recently decreased her amlodipine to 2.5 mg daily.  Her dizzy spell has improved after this medication change.  I recommended a Lexiscan Myoview for her chest discomfort.   Past Medical History:  Diagnosis Date   . Abnormal nuclear stress test 10/05/2011  . Anxiety    2005- related to care of aging parent   . Arthritis    degeneration - spondylosis, OA - all over body,   . Blood transfusion    postop- back surg. 1987  . Bunion   . CAD (coronary artery disease)significant diseas w/ 99% LAD, 80% D1, 80% LCX 10/05/2011  . GERD (gastroesophageal reflux disease)   . Hyperlipemia   . Hypertension    ireg. heartbeat, no referral made to cardiologist , sees [facility]. Med.   . Neuromuscular disorder (HCC)    burning- L leg , numbing of toes   . S/P CABG x 4, LIMA-LAD, SVG-OM2, SVG-diag, SVG-RCA 10/05/2011  . Shortness of breath     Past Surgical History:  Procedure Laterality Date  . ABDOMINAL HYSTERECTOMY     partial hysterectomy- 1972  . ANTERIOR APPROACH HEMI HIP ARTHROPLASTY Left 06/26/2016   Procedure: ANTERIOR APPROACH TOTAL HIP ARTHROPLASTY;  Surgeon: Leandrew Koyanagi, MD;  Location: [facility];  Service: Orthopedics;  Laterality: Left;  . BACK SURGERY     1987-   . BREAST CYST ASPIRATION Left 06/18/2010  . CARDIAC CATHETERIZATION  10/12/2011   high-grade ca+ prox. LAD diagonal branch,higly diseased diagonal branch & obtuse marginal branch  . CORONARY ARTERY BYPASS GRAFT  10/04/2011   Procedure: CORONARY ARTERY BYPASS GRAFTING (CABG);  Surgeon: Gaye Pollack, MD;  Location: [facility];  Service: Open Heart  Surgery;  Laterality: N/A;  CABG x four using left internal mammary artery and right leg greater saphenous vein   . EYE SURGERY     cataracts removed- bilateral, no IOL  . FOOT SURGERY Right January 2013   HT 5  . LEFT HEART CATHETERIZATION WITH CORONARY ANGIOGRAM N/A 10/02/2011   Procedure: LEFT HEART CATHETERIZATION WITH CORONARY ANGIOGRAM;  Surgeon: Lorretta Harp, MD;  Location: [facility] CATH LAB;  Service: Cardiovascular;  Laterality: N/A;  . LUMBAR LAMINECTOMY/DECOMPRESSION MICRODISCECTOMY  03/04/2011   Procedure: LUMBAR LAMINECTOMY/DECOMPRESSION MICRODISCECTOMY;  Surgeon: Hosie Spangle, MD;  Location:  [place] [facility];  Service: Neurosurgery;  Laterality: N/A;  Thoracic Ten-Thoracic Twelve Thoracic Laminectomy  . NM MYOVIEW LTD  09/15/2011   mild ischemia mid anterior & apical anterior region.EF: 86%  . TOE SURGERY     R foot- removed calus between 5-4  . TUBAL LIGATION      Current Medications: Outpatient Medications Prior to Visit  Medication Sig Dispense Refill  . amLODipine (NORVASC) 5 MG tablet Take 2.5 mg by mouth. Take half tablet daily    . aspirin EC 81 MG tablet Take 1 tablet (81 mg total) by mouth daily. 100 tablet 0  . rosuvastatin (CRESTOR) 10 MG tablet Take 1 tablet (10 mg total) by mouth daily. 90 tablet 3  . acetaminophen (TYLENOL) 325 MG tablet Take 1 or 2 tablets every 6 hours as needed for pain    . ADVAIR DISKUS 100-50 MCG/DOSE AEPB USE 1 PUFF EVERY 12 HOURS  0  . albuterol (PROVENTIL HFA;VENTOLIN HFA) 108 (90 BASE) MCG/ACT inhaler Inhale 1-2 puffs into the lungs every 6 (six) hours as needed for wheezing or shortness of breath.    . Cholecalciferol (VITAMIN D3) 1000 UNITS CAPS Take 2,000 Units by mouth every morning.      . dorzolamide-timolol (COSOPT) 22.3-6.8 MG/ML ophthalmic solution Place 1 drop into both eyes 2 (two) times daily.  5  . estradiol (ESTRACE) 0.1 MG/GM vaginal cream     . fluticasone (FLONASE) 50 MCG/ACT nasal spray Place 2 sprays into the nose as needed for rhinitis.    Marland Kitchen latanoprost (XALATAN) 0.005 % ophthalmic solution Place 1 drop into both eyes at bedtime.      Marland Kitchen loratadine (CLARITIN) 10 MG tablet Take 10 mg by mouth daily as needed for allergies.    . magnesium oxide (MAG-OX) 400 MG tablet Take 400 mg by mouth daily.    . metoprolol tartrate (LOPRESSOR) 25 MG tablet Take 25 mg by mouth 2 (two) times daily.      No facility-administered medications prior to visit.      Allergies:   Aspirin and Lactose intolerance (gi)   Social History   Socioeconomic History  . Marital status: Widowed    Spouse name: Not on file  . Number of children: Not  on file  . Years of education: Not on file  . Highest education level: Not on file  Occupational History  . Occupation: Was working at [employer]  . Financial resource strain: Not on file  . Food insecurity    Worry: Not on file    Inability: Not on file  . Transportation needs    Medical: Not on file    Non-medical: Not on file  Tobacco Use  . Smoking status: Never Smoker  . Smokeless tobacco: Never Used  Substance and Sexual Activity  . Alcohol use: No    Alcohol/week: 0.0 standard drinks  . Drug use:  No  . Sexual activity: Not on file  Lifestyle  . Physical activity    Days per week: Not on file    Minutes per session: Not on file  . Stress: Not on file  Relationships  . Social Herbalist on phone: Not on file    Gets together: Not on file    Attends religious service: Not on file    Active member of club or organization: Not on file    Attends meetings of clubs or organizations: Not on file    Relationship status: Not on file  Other Topics Concern  . Not on file  Social History Narrative   Lives alone in an apartment.      Family History:  The patient's family history includes Heart attack in her brother, father, and mother; Hypertension in her father.   ROS:   Please see the history of present illness.    ROS All other systems reviewed and are negative.   PHYSICAL EXAM:   VS:  BP (!) 148/64   Pulse 63   Ht 4\' 11"  (1.499 m)   Wt 99 lb 9.6 oz (45.2 kg)   SpO2 98%   BMI 20.12 kg/m    GEN: Well nourished, well developed, in no acute distress  HEENT: normal  Neck: no JVD, carotid bruits, or masses Cardiac: RRR; no murmurs, rubs, or gallops,no edema  Respiratory:  clear to auscultation bilaterally, normal work of breathing GI: soft, nontender, nondistended, + BS MS: no deformity or atrophy  Skin: warm and dry, no rash Neuro:  Alert and Oriented x 3, Strength and sensation are intact Psych: euthymic mood, full affect   Wt Readings from Last 3 Encounters:  09/12/18 99 lb 9.6 oz (45.2 kg)  10/04/17 100 lb 3.2 oz (45.5 kg)  05/24/17 106 lb (48.1 kg)      Studies/Labs Reviewed:   EKG:  EKG is ordered today.  The ekg ordered today demonstrates normal sinus rhythm without significant ST-T wave changes.  Recent Labs: No results found for requested labs within last 8760 hours.   Lipid Panel    Component Value Date/Time   CHOL 165 09/22/2016 1128   TRIG 70 09/22/2016 1128   HDL 79 09/22/2016 1128   CHOLHDL 2.1 09/22/2016 1128   CHOLHDL 1.9 10/30/2015 1405   VLDL 12 10/30/2015 1405   LDLCALC 72 09/22/2016 1128    Additional studies/ records that were reviewed today include:    Myoview 02/06/2015 Study Highlights   Nuclear stress EF: 87%.  There was no ST segment deviation noted during stress.  Defect 1: There is a small defect of mild severity present in the apex location.  This is a low risk study.   Low risk stress nuclear study with a small and fixed apical abnormality. Prior to CABG, the nuclear study showed a reversible defect in the same territory. Wall motion is normal in this area: may represent scar or attenuation artifact. Normal left ventricular regional and global systolic function.      ASSESSMENT:    1. Chest pain of uncertain etiology   2. Coronary artery disease involving coronary bypass graft of native heart with angina pectoris ([place])   3. Essential hypertension   4. Other hyperlipidemia   5. Infection of prosthetic hip joint, subsequent encounter   6. Dizziness      PLAN:  In order of problems listed above:  1. Chest pain: The characteristic of the chest pain is somewhat atypical as  it only occurs at rest and does not occur during exertion.  I recommended a Lexiscan Myoview as initial evaluation  2. CAD s/p CABG: Unfortunately she continued to have tenderness in the sternotomy site 7 years after the bypass surgery.  Continue aspirin, statin, beta-blocker  and amlodipine.  3. Hypertension: Blood pressure borderline elevated, however her amlodipine was actually recently decreased to 2.5 mg daily due to dizzy spell when she lift up her head.  She says after the amlodipine was decreased, her dizzy spell has significantly improved.  I will target for a systolic goal of 176-160.  4. Dizziness: No carotid plaque was seen on previous Doppler in 2013 prior to bypass surgery.  Symptom of dizziness improved after decreasing the amlodipine dosage.  If symptom recur later, consider a repeat carotid Doppler  5. Hyperlipidemia: Continue on Crestor  6. Prosthetic hip infection: She had IV antibiotics through PICC line before, currently finishing a course of oral antibiotic.  This is being [facility] by [facility].  She does not have significant pain in her left hip.  She walks about 45 minutes to the local bus station and back.    Medication Adjustments/Labs and Tests Ordered: Current medicines are reviewed at length with the patient today.  Concerns regarding medicines are outlined above.  Medication changes, Labs and Tests ordered today are listed in the Patient Instructions below. Patient Instructions  Medication Instructions:   Your physician recommends that you continue on your current medications as directed. Please refer to the Current Medication list given to you today.  If you need a refill on your cardiac medications before your next appointment, please call your pharmacy.   Lab work: NONE ordered at this time of appointment   If you have labs (blood work) drawn today and your tests are completely normal, you will receive your results only by: Marland Kitchen MyChart Message (if you have MyChart) OR . A paper copy in the mail If you have any lab test that is abnormal or we need to change your treatment, we will call you to review the results.  Testing/Procedures: Your physician has requested that you have a Lexiscan Myoview. For further information please  visit [email or web address]. Please follow instruction sheet, as given.  Please schedule for 2-3 weeks  Follow-Up: At [facility], you and your health needs are our priority.  As part of our continuing mission to provide you with exceptional heart care, we have created designated Provider Care Teams.  These Care Teams include your primary Cardiologist (physician) and Advanced Practice Providers (APPs -  Physician Assistants and Nurse Practitioners) who all work together to provide you with the care you need, when you need it. You will need a follow up appointment in 2-3 months.  Please call our office 2 months in advance to schedule this appointment.  You may see Sanda Klein, MD or one of the following Advanced Practice Providers on your designated Care Team: [place], [place] . Fabian Sharp, PA-C  Any Other Special Instructions Will Be Listed Below (If Applicable).       Hilbert Corrigan, [place]  09/12/2018 2:01 PM    [facility] [place], [place], [place]  [address] Phone: [phone]; Fax: [phone]

## 2018-09-12 NOTE — Patient Instructions (Signed)
Medication Instructions:   Your physician recommends that you continue on your current medications as directed. Please refer to the Current Medication list given to you today.  If you need a refill on your cardiac medications before your next appointment, please call your pharmacy.   Lab work: NONE ordered at this time of appointment   If you have labs (blood work) drawn today and your tests are completely normal, you will receive your results only by: Marland Kitchen MyChart Message (if you have MyChart) OR . A paper copy in the mail If you have any lab test that is abnormal or we need to change your treatment, we will call you to review the results.  Testing/Procedures: Your physician has requested that you have a Lexiscan Myoview. For further information please visit [email or web address]. Please follow instruction sheet, as given.  Please schedule for 2-3 weeks  Follow-Up: At [facility], you and your health needs are our priority.  As part of our continuing mission to provide you with exceptional heart care, we have created designated Provider Care Teams.  These Care Teams include your primary Cardiologist (physician) and Advanced Practice Providers (APPs -  Physician Assistants and Nurse Practitioners) who all work together to provide you with the care you need, when you need it. You will need a follow up appointment in 2-3 months.  Please call our office 2 months in advance to schedule this appointment.  You may see Sanda Klein, MD or one of the following Advanced Practice Providers on your designated Care Team: [place], [place] . Fabian Sharp, PA-C  Any Other Special Instructions Will Be Listed Below (If Applicable).

## 2018-09-12 NOTE — Telephone Encounter (Signed)

## 2018-09-22 ENCOUNTER — Telehealth (HOSPITAL_COMMUNITY): Payer: Self-pay

## 2018-09-22 NOTE — Telephone Encounter (Signed)
Encounter complete. 

## 2018-09-27 ENCOUNTER — Other Ambulatory Visit: Payer: Self-pay

## 2018-09-27 ENCOUNTER — Ambulatory Visit (HOSPITAL_COMMUNITY)
Admission: RE | Admit: 2018-09-27 | Discharge: 2018-09-27 | Disposition: A | Discharge status: Home / Self Care | Payer: Medicare HMO | Source: Ambulatory Visit | Attending: Cardiovascular Disease | Admitting: Cardiovascular Disease

## 2018-09-27 DIAGNOSIS — R0789 Other chest pain: Secondary | ICD-10-CM | POA: Diagnosis present

## 2018-09-27 DIAGNOSIS — R079 Chest pain, unspecified: Secondary | ICD-10-CM

## 2018-09-27 LAB — MYOCARDIAL PERFUSION IMAGING
LV dias vol: 46 mL (ref 46–106)
LV sys vol: 11 mL
Peak HR: 113 {beats}/min
Rest HR: 68 {beats}/min
SDS: 3
SRS: 4
SSS: 7
TID: 1.01

## 2018-09-27 MED ORDER — TECHNETIUM TC 99M TETROFOSMIN IV KIT
9.7000 | PACK | Freq: Once | INTRAVENOUS | Status: AC | PRN
  Administered 2018-09-27: 9.7 via INTRAVENOUS
  Filled 2018-09-27: qty 10

## 2018-09-27 MED ORDER — TECHNETIUM TC 99M TETROFOSMIN IV KIT
30.6000 | PACK | Freq: Once | INTRAVENOUS | Status: AC | PRN
  Administered 2018-09-27: 30.6 via INTRAVENOUS
  Filled 2018-09-27: qty 31

## 2018-09-27 MED ORDER — REGADENOSON 0.4 MG/5ML IV SOLN
0.4000 mg | Freq: Once | INTRAVENOUS | Status: AC
  Administered 2018-09-27: 0.4 mg via INTRAVENOUS

## 2018-09-28 NOTE — Progress Notes (Signed)
The patient has been notified of the result and verbalized understanding.  All questions (if any) were answered. Jacqulynn Cadet, [facility] 09/28/2018 4:39 PM   Patient stated that she would like for a copy of the results to be sent to her PCP.

## 2018-12-09 ENCOUNTER — Encounter: Payer: Self-pay | Admitting: Cardiovascular Disease

## 2018-12-09 ENCOUNTER — Ambulatory Visit (INDEPENDENT_AMBULATORY_CARE_PROVIDER_SITE_OTHER): Payer: Medicare HMO | Admitting: Cardiovascular Disease

## 2018-12-09 ENCOUNTER — Other Ambulatory Visit: Payer: Self-pay

## 2018-12-09 VITALS — BP 130/75 | HR 60 | Temp 97.3°F | Ht 59.0 in | Wt 103.0 lb

## 2018-12-09 DIAGNOSIS — I2510 Atherosclerotic heart disease of native coronary artery without angina pectoris: Secondary | ICD-10-CM

## 2018-12-09 DIAGNOSIS — Z96649 Presence of unspecified artificial hip joint: Secondary | ICD-10-CM | POA: Diagnosis not present

## 2018-12-09 DIAGNOSIS — T8459XS Infection and inflammatory reaction due to other internal joint prosthesis, sequela: Secondary | ICD-10-CM | POA: Diagnosis not present

## 2018-12-09 DIAGNOSIS — E7800 Pure hypercholesterolemia, unspecified: Secondary | ICD-10-CM

## 2018-12-09 NOTE — Progress Notes (Signed)
Cardiology Office Note    Date:  12/09/2018   ID:  Theresa Crosby, Theresa Crosby 06/15/1932, MRN [id number]  PCP:  Francesca Oman, DO  Cardiologist:  Dr. Sallyanne Kuster   Chief Complaint  Patient presents with  . Coronary Artery Disease    History of Present Illness:  Theresa Crosby is a 85 y.o. female with PMH of CAD s/p CABG 2013 (LIMA-LAD, SVG-OM2, SVG-Diagonal, SVG-RCA), HTN, and HLD.  Last Myoview obtained on 02/06/2015 showed EF 87%, small defect of mild severity present in the apex location, overall low risk study.  Previous heart monitor obtained on 08/26/2016 showed normal sinus rhythm, very rare PACs, rare relatively little increase in the heart rate during the day concerning for beta-blocker induced chronotropic incompetence.  She had symptoms of heart flutter that occurred in association with normal sinus rhythm.  Echocardiogram in April 2019 showed grade 2 DD, EF 60 to 65%, peak PA pressure 34 mmHg.  She was last seen by Dr. Sallyanne Kuster in August 2019 at which time she was doing well.   She had a normal nuclear stress test in August 2020.  She had normal ABI in 2019.  She has a variety of orthopedic complaints and neuropathic symptoms in both feet.  She has been having problems with diarrhea and vaginal yeast infection after prolonged treatment with doxycycline.  She just finished the antibiotic a week ago prescribed 3 doses of an antifungal.  She does not have any cardiac issues.  She would like to keep her medicine list as short as possible.  She has restarted her statin.  Interrupting the statin had no benefit for her musculoskeletal pain.  The patient specifically denies any chest pain at rest exertion, dyspnea at rest or with exertion, orthopnea, paroxysmal nocturnal dyspnea, syncope, palpitations, focal neurological deficits, intermittent claudication, lower extremity edema, unexplained weight gain, cough, hemoptysis or wheezing.    Past Medical History:  Diagnosis Date  . Abnormal  nuclear stress test 10/05/2011  . Anxiety    2005- related to care of aging parent   . Arthritis    degeneration - spondylosis, OA - all over body,   . Blood transfusion    postop- back surg. 1987  . Bunion   . CAD (coronary artery disease)significant diseas w/ 99% LAD, 80% D1, 80% LCX 10/05/2011  . GERD (gastroesophageal reflux disease)   . Hyperlipemia   . Hypertension    ireg. heartbeat, no referral made to cardiologist , sees [place]. Med.   . Neuromuscular disorder (HCC)    burning- L leg , numbing of toes   . S/P CABG x 4, LIMA-LAD, SVG-OM2, SVG-diag, SVG-RCA 10/05/2011  . Shortness of breath     Past Surgical History:  Procedure Laterality Date  . ABDOMINAL HYSTERECTOMY     partial hysterectomy- 1972  . ANTERIOR APPROACH HEMI HIP ARTHROPLASTY Left 06/26/2016   Procedure: ANTERIOR APPROACH TOTAL HIP ARTHROPLASTY;  Surgeon: Leandrew Koyanagi, MD;  Location: [place];  Service: Orthopedics;  Laterality: Left;  . BACK SURGERY     1987-   . BREAST CYST ASPIRATION Left 06/18/2010  . CARDIAC CATHETERIZATION  10/12/2011   high-grade ca+ prox. LAD diagonal branch,higly diseased diagonal branch & obtuse marginal branch  . CORONARY ARTERY BYPASS GRAFT  10/04/2011   Procedure: CORONARY ARTERY BYPASS GRAFTING (CABG);  Surgeon: Gaye Pollack, MD;  Location: [place];  Service: Open Heart Surgery;  Laterality: N/A;  CABG x four using left internal mammary artery and right leg greater saphenous  vein   . EYE SURGERY     cataracts removed- bilateral, no IOL  . FOOT SURGERY Right January 2013   HT 5  . LEFT HEART CATHETERIZATION WITH CORONARY ANGIOGRAM N/A 10/02/2011   Procedure: LEFT HEART CATHETERIZATION WITH CORONARY ANGIOGRAM;  Surgeon: Lorretta Harp, MD;  Location: [facility] CATH LAB;  Service: Cardiovascular;  Laterality: N/A;  . LUMBAR LAMINECTOMY/DECOMPRESSION MICRODISCECTOMY  03/04/2011   Procedure: LUMBAR LAMINECTOMY/DECOMPRESSION MICRODISCECTOMY;  Surgeon: Hosie Spangle, MD;  Location: [place] NEURO  ORS;  Service: Neurosurgery;  Laterality: N/A;  Thoracic Ten-Thoracic Twelve Thoracic Laminectomy  . NM MYOVIEW LTD  09/15/2011   mild ischemia mid anterior & apical anterior region.EF: 86%  . TOE SURGERY     R foot- removed calus between 5-4  . TUBAL LIGATION      Current Medications: Outpatient Medications Prior to Visit  Medication Sig Dispense Refill  . amLODipine (NORVASC) 5 MG tablet Take 2.5 mg by mouth. Pt takes 10mg  daily    . aspirin EC 81 MG tablet Take 1 tablet (81 mg total) by mouth daily. 100 tablet 0  . Cholecalciferol (VITAMIN D3) 1000 UNITS CAPS Take 2,000 Units by mouth every morning.      . dorzolamide-timolol (COSOPT) 22.3-6.8 MG/ML ophthalmic solution Place 1 drop into both eyes 2 (two) times daily.  5  . latanoprost (XALATAN) 0.005 % ophthalmic solution Place 1 drop into both eyes at bedtime.      . rosuvastatin (CRESTOR) 10 MG tablet Take 1 tablet (10 mg total) by mouth daily. 90 tablet 3  . acetaminophen (TYLENOL) 325 MG tablet Take 1 or 2 tablets every 6 hours as needed for pain    . ADVAIR DISKUS 100-50 MCG/DOSE AEPB USE 1 PUFF EVERY 12 HOURS  0  . albuterol (PROVENTIL HFA;VENTOLIN HFA) 108 (90 BASE) MCG/ACT inhaler Inhale 1-2 puffs into the lungs every 6 (six) hours as needed for wheezing or shortness of breath.    . estradiol (ESTRACE) 0.1 MG/GM vaginal cream     . fluticasone (FLONASE) 50 MCG/ACT nasal spray Place 2 sprays into the nose as needed for rhinitis.    Marland Kitchen loratadine (CLARITIN) 10 MG tablet Take 10 mg by mouth daily as needed for allergies.    . magnesium oxide (MAG-OX) 400 MG tablet Take 400 mg by mouth daily.    . metoprolol tartrate (LOPRESSOR) 25 MG tablet Take 25 mg by mouth 2 (two) times daily.      No facility-administered medications prior to visit.      Allergies:   Aspirin and Lactose intolerance (gi)   Social History   Socioeconomic History  . Marital status: Widowed    Spouse name: Not on file  . Number of children: Not on file  .  Years of education: Not on file  . Highest education level: Not on file  Occupational History  . Occupation: Was working at [employer]  . Financial resource strain: Not on file  . Food insecurity    Worry: Not on file    Inability: Not on file  . Transportation needs    Medical: Not on file    Non-medical: Not on file  Tobacco Use  . Smoking status: Never Smoker  . Smokeless tobacco: Never Used  Substance and Sexual Activity  . Alcohol use: No    Alcohol/week: 0.0 standard drinks  . Drug use: No  . Sexual activity: Not on file  Lifestyle  . Physical activity    Days  per week: Not on file    Minutes per session: Not on file  . Stress: Not on file  Relationships  . Social Herbalist on phone: Not on file    Gets together: Not on file    Attends religious service: Not on file    Active member of club or organization: Not on file    Attends meetings of clubs or organizations: Not on file    Relationship status: Not on file  Other Topics Concern  . Not on file  Social History Narrative   Lives alone in an apartment.      Family History:  The patient's family history includes Heart attack in her brother, father, and mother; Hypertension in her father.   ROS:   Please see the history of present illness.    ROS All other systems are reviewed and are negative.    PHYSICAL EXAM:   VS:  BP 130/75   Pulse 60   Temp (!) 97.3 F (36.3 C)   Ht 4\' 11"  (1.499 m)   Wt 103 lb (46.7 kg)   SpO2 100%   BMI 20.80 kg/m     General: Alert, oriented x3, no distress, very lean Head: no evidence of trauma, PERRL, EOMI, no exophtalmos or lid lag, no myxedema, no xanthelasma; normal ears, nose and oropharynx Neck: normal jugular venous pulsations and no hepatojugular reflux; brisk carotid pulses without delay and no carotid bruits Chest: clear to auscultation, no signs of consolidation by percussion or palpation, normal fremitus, symmetrical and  full respiratory excursions Cardiovascular: normal position and quality of the apical impulse, regular rhythm, normal first and second heart sounds, no murmurs, rubs or gallops Abdomen: no tenderness or distention, no masses by palpation, no abnormal pulsatility or arterial bruits, normal bowel sounds, no hepatosplenomegaly Extremities: no clubbing, cyanosis or edema; 2+ radial, ulnar and brachial pulses bilaterally; 2+ right femoral, posterior tibial and dorsalis pedis pulses; 2+ left femoral, posterior tibial and dorsalis pedis pulses; no subclavian or femoral bruits Neurological: grossly nonfocal Psych: Normal mood and affect   Wt Readings from Last 3 Encounters:  12/09/18 103 lb (46.7 kg)  09/27/18 99 lb (44.9 kg)  09/12/18 99 lb 9.6 oz (45.2 kg)      Studies/Labs Reviewed:   EKG:  EKG is not ordered today.  The ekg ordered 09/12/2018 demonstrates NSR, no repol changes Recent Labs: No results found for requested labs within last 8760 hours.   Lipid Panel    Component Value Date/Time   CHOL 165 09/22/2016 1128   TRIG 70 09/22/2016 1128   HDL 79 09/22/2016 1128   CHOLHDL 2.1 09/22/2016 1128   CHOLHDL 1.9 10/30/2015 1405   VLDL 12 10/30/2015 1405   LDLCALC 72 09/22/2016 1128    Additional studies/ records that were reviewed today include:    Myoview 02/06/2015 Study Highlights   Nuclear stress EF: 87%.  There was no ST segment deviation noted during stress.  Defect 1: There is a small defect of mild severity present in the apex location.  This is a low risk study.   Low risk stress nuclear study with a small and fixed apical abnormality. Prior to CABG, the nuclear study showed a reversible defect in the same territory. Wall motion is normal in this area: may represent scar or attenuation artifact. Normal left ventricular regional and global systolic function.      ASSESSMENT:    1. Coronary artery disease involving native coronary artery of native heart  without angina pectoris   2. Hypercholesterolemia   3. Infection of prosthetic hip joint, sequela      PLAN:  In order of problems listed above:  1. CAD s/p CABG: Low risk nuclear study.  She would like to simplify her medicine list.  She understands the purpose of the aspirin and the statin and I would like her to continue the beta-blocker for reduce risk of cardiovascular events.  The amlodipine is the least important from a cardiac point of view.  I do not think she needs and as an antianginal and we can try to wean it off.  She might need it for hypertension though.   2. Hypertension: Blood pressure at home is consistently 130/70.  It might become too high without the amlodipine, but I would be satisfied with anything less than 140/90 in this octogenarian. 3. Hyperlipidemia: Continue on Crestor.  Target LDL less than 70.  Plan for her to have the labs next week when she goes to see her PCP. 4. Prosthetic hip infection: On doxycycline until a few days ago.   Medication Adjustments/Labs and Tests Ordered: Current medicines are reviewed at length with the patient today.  Concerns regarding medicines are outlined above.  Medication changes, Labs and Tests ordered today are listed in the Patient Instructions below. Patient Instructions  Medication Instructions:  No changes  *If you need a refill on your cardiac medications before your next appointment, please call your pharmacy*  Lab Work: Your provider would like for you to have the following labs today: lipid  If you have labs (blood work) drawn today and your tests are completely normal, you will receive your results only by: Marland Kitchen MyChart Message (if you have MyChart) OR . A paper copy in the mail If you have any lab test that is abnormal or we need to change your treatment, we will call you to review the results.  Testing/Procedures: None ordered  Follow-Up: At [facility], you and your health needs are our priority.  As part of  our continuing mission to provide you with exceptional heart care, we have created designated Provider Care Teams.  These Care Teams include your primary Cardiologist (physician) and Advanced Practice Providers (APPs -  Physician Assistants and Nurse Practitioners) who all work together to provide you with the care you need, when you need it.  Your next appointment:   12 months  The format for your next appointment:   In Person  Provider:   Sanda Klein, MD       Signed, Sanda Klein, MD  12/09/2018 2:01 PM    [facility] [address], [place], [place]  [address] Phone: [phone]; Fax: [phone]

## 2018-12-09 NOTE — Patient Instructions (Signed)
Medication Instructions:  No changes  *If you need a refill on your cardiac medications before your next appointment, please call your pharmacy*  Lab Work: Your provider would like for you to have the following labs today: lipid  If you have labs (blood work) drawn today and your tests are completely normal, you will receive your results only by: Marland Kitchen MyChart Message (if you have MyChart) OR . A paper copy in the mail If you have any lab test that is abnormal or we need to change your treatment, we will call you to review the results.  Testing/Procedures: None ordered  Follow-Up: At [facility], you and your health needs are our priority.  As part of our continuing mission to provide you with exceptional heart care, we have created designated Provider Care Teams.  These Care Teams include your primary Cardiologist (physician) and Advanced Practice Providers (APPs -  Physician Assistants and Nurse Practitioners) who all work together to provide you with the care you need, when you need it.  Your next appointment:   12 months  The format for your next appointment:   In Person  Provider:   Sanda Klein, MD

## 2019-06-24 NOTE — Progress Notes (Signed)
Cardiology Office Note   Date:  06/26/2019   ID:  Theresa Crosby, DOB Dec 18, 1932, MRN [id number]  PCP:  Francesca Oman, DO  Cardiologist:  Sanda Klein, MD EP: None  Chief Complaint  Patient presents with  . Follow-up    CAD      History of Present Illness: Theresa Crosby is a 86 y.o. female with PMH of CAD s/p CABG in 2013 (LIMA-LAD, SVG-OM2, SVG-Diagonal, SVG-RCA), HTN, and HLD, who presents for routine follow-up.  She was last evaluated by cardiology at an outpatient visit with Dr. Sallyanne Kuster 11/2018, at which time she had no cardiac complaints, though was hopeful to simplify her medication list. She was recommended to continue her current medications for the time being and follow-up in 1 year. Her last ischemic evaluation was a NST 09/2018 which was without ischemia. Her last echocardiogram in 2019 showed EF 60-65%, G2DD, no RWMA, trivial MR, and mild TR.   She presents today for routine follow-up. Her primary complaint today is related to her chronic antibiotics for management of her prosthetic hip infection. She has been told she will need lifelong suppressive antibiotics. She was on doxycycline 50mg , 2 caps daily which caused quite a few side effects - mainly GI upset and vaginal issues. She self discontinued these medications in March, though was restarted on doxycycline 100mg , 1 cap daily by her ID doctor 06/21/19. She is frustrated that the same medication was restarted and she is having the same side effects again and again stopped taking it as of yesterday. She was encouraged to reach out to her ID provider first thing tomorrow for an alternative suppressive antibiotic. Additionally she reports claudication and constantly cold feet. She had a home scan done through her insurance 06/07/19 to evaluate for PAD which suggested mild left lower extremity obstruction and significant  right lower extremity obstruction. She recalls standing on a device which then gave these results?  She has no complaints of chest pain, SOB, palpitations, dizziness, lightheadedness, or syncope. She also notes some trouble with metoprolol recently and determined she only tolerates the formulary at [facility], after having issues with [facility] brand metoprolol.    Past Medical History:  Diagnosis Date  . Abnormal nuclear stress test 10/05/2011  . Anxiety    2005- related to care of aging parent   . Arthritis    degeneration - spondylosis, OA - all over body,   . Blood transfusion    postop- back surg. 1987  . Bunion   . CAD (coronary artery disease)significant diseas w/ 99% LAD, 80% D1, 80% LCX 10/05/2011  . GERD (gastroesophageal reflux disease)   . Hyperlipemia   . Hypertension    ireg. heartbeat, no referral made to cardiologist , sees [facility]. Med.   . Neuromuscular disorder (HCC)    burning- L leg , numbing of toes   . S/P CABG x 4, LIMA-LAD, SVG-OM2, SVG-diag, SVG-RCA 10/05/2011  . Shortness of breath     Past Surgical History:  Procedure Laterality Date  . ABDOMINAL HYSTERECTOMY     partial hysterectomy- 1972  . ANTERIOR APPROACH HEMI HIP ARTHROPLASTY Left 06/26/2016   Procedure: ANTERIOR APPROACH TOTAL HIP ARTHROPLASTY;  Surgeon: Leandrew Koyanagi, MD;  Location: [place];  Service: Orthopedics;  Laterality: Left;  . BACK SURGERY     1987-   . BREAST CYST ASPIRATION Left 06/18/2010  . CARDIAC CATHETERIZATION  10/12/2011   high-grade ca+ prox. LAD diagonal branch,higly diseased diagonal branch & obtuse marginal branch  .  CORONARY ARTERY BYPASS GRAFT  10/04/2011   Procedure: CORONARY ARTERY BYPASS GRAFTING (CABG);  Surgeon: Gaye Pollack, MD;  Location: [place];  Service: Open Heart Surgery;  Laterality: N/A;  CABG x four using left internal mammary artery and right leg greater saphenous vein   . EYE SURGERY     cataracts removed- bilateral, no IOL  . FOOT SURGERY Right January 2013   HT 5  . LEFT HEART CATHETERIZATION WITH CORONARY ANGIOGRAM N/A 10/02/2011   Procedure: LEFT HEART  CATHETERIZATION WITH CORONARY ANGIOGRAM;  Surgeon: Lorretta Harp, MD;  Location: [facility] CATH LAB;  Service: Cardiovascular;  Laterality: N/A;  . LUMBAR LAMINECTOMY/DECOMPRESSION MICRODISCECTOMY  03/04/2011   Procedure: LUMBAR LAMINECTOMY/DECOMPRESSION MICRODISCECTOMY;  Surgeon: Hosie Spangle, MD;  Location: [place] NEURO ORS;  Service: Neurosurgery;  Laterality: N/A;  Thoracic Ten-Thoracic Twelve Thoracic Laminectomy  . NM MYOVIEW LTD  09/15/2011   mild ischemia mid anterior & apical anterior region.EF: 86%  . TOE SURGERY     R foot- removed calus between 5-4  . TUBAL LIGATION       Current Outpatient Medications  Medication Sig Dispense Refill  . acetaminophen (TYLENOL) 325 MG tablet Take 1 or 2 tablets every 6 hours as needed for pain    . amLODipine (NORVASC) 5 MG tablet Take 2.5 mg by mouth. Pt takes 10mg  daily    . aspirin EC 81 MG tablet Take 1 tablet (81 mg total) by mouth daily. 100 tablet 0  . Cholecalciferol (VITAMIN D3) 1000 UNITS CAPS Take 2,000 Units by mouth every morning.      . dorzolamide-timolol (COSOPT) 22.3-6.8 MG/ML ophthalmic solution Place 1 drop into both eyes 2 (two) times daily.  5  . loratadine (CLARITIN) 10 MG tablet Take 10 mg by mouth daily as needed for allergies.    . magnesium oxide (MAG-OX) 400 MG tablet Take 400 mg by mouth daily.    . metoprolol tartrate (LOPRESSOR) 25 MG tablet Take 1 tablet (25 mg total) by mouth 2 (two) times daily. 90 tablet 3  . omeprazole (PRILOSEC) 20 MG capsule Take 20 mg by mouth daily.    . rosuvastatin (CRESTOR) 10 MG tablet Take 1 tablet (10 mg total) by mouth daily. 90 tablet 3   No current facility-administered medications for this visit.    Allergies:   Aspirin and Lactose intolerance (gi)    Social History:  The patient  reports that she has never smoked. She has never used smokeless tobacco. She reports that she does not drink alcohol or use drugs.   Family History:  The patient's family history includes Heart attack in  her brother, father, and mother; Hypertension in her father.    ROS:  Please see the history of present illness.   Otherwise, review of systems are positive for none.   All other systems are reviewed and negative.    PHYSICAL EXAM: VS:  BP 130/74   Pulse 62   Temp (!) 97.3 F (36.3 C)   Ht 4\' 11"  (1.499 m)   Wt 102 lb 9.6 oz (46.5 kg)   SpO2 99%   BMI 20.72 kg/m  , BMI Body mass index is 20.72 kg/m. GEN: Well nourished, well developed, in no acute distress HEENT: sclera anicteric  Neck: no JVD or carotid bruits Cardiac: RRR; no murmurs, rubs, or gallops, no edema  Respiratory:  clear to auscultation bilaterally, normal work of breathing GI: soft, nontender, nondistended, + BS MS: no deformity or atrophy Skin: warm and dry, no  rash Neuro:  Strength and sensation are intact Psych: euthymic mood, full affect   EKG:  EKG is ordered today. The ekg ordered today demonstrates sinus rhythm, rate 62 bpm, no STE/D, no TWI   Recent Labs: No results found for requested labs within last 8760 hours.    Lipid Panel    Component Value Date/Time   CHOL 165 09/22/2016 1128   TRIG 70 09/22/2016 1128   HDL 79 09/22/2016 1128   CHOLHDL 2.1 09/22/2016 1128   CHOLHDL 1.9 10/30/2015 1405   VLDL 12 10/30/2015 1405   LDLCALC 72 09/22/2016 1128      Wt Readings from Last 3 Encounters:  06/26/19 102 lb 9.6 oz (46.5 kg)  12/09/18 103 lb (46.7 kg)  09/27/18 99 lb (44.9 kg)      Other studies Reviewed: Additional studies/ records that were reviewed today include:   NST 09/2018:  The left ventricular ejection fraction is hyperdynamic (>65%).  Nuclear stress EF: 77%.  There was no ST segment deviation noted during stress.  This is a low risk study.   1. EF 77%, normal wall motion.  2. Fixed small, mild apical perfusion defect.  Given normal wall motion, this seems most likely to be soft tissue attenuation.  No ischemia.   Low risk study.   Echocardiogram 2019: Study  Conclusions   - Left ventricle: The cavity size was normal. Systolic function was  normal. The estimated ejection fraction was in the range of 60%  to 65%. Wall motion was normal; there were no regional wall  motion abnormalities. Features are consistent with a pseudonormal  left ventricular filling pattern, with concomitant abnormal  relaxation and increased filling pressure (grade 2 diastolic  dysfunction). Doppler parameters are consistent with high  ventricular filling pressure.  - Aortic valve: There was no regurgitation.  - Mitral valve: Transvalvular velocity was within the normal range.  There was no evidence for stenosis. There was trivial  regurgitation.  - Right ventricle: The cavity size was normal. Wall thickness was  normal. Systolic function was normal.  - Atrial septum: No defect or patent foramen ovale was identified.  - Tricuspid valve: There was mild regurgitation.  - Pulmonary arteries: Systolic pressure was within the normal  range. PA peak pressure: 34 mm Hg (S).   ASSESSMENT AND PLAN:  1. CAD s/p CABG in 2013: no anginal complaints - Continue aspirin and statin - Continue metoprolol  2. Suspected PAD: Last ABI study was in 2019 and showed no significant disease, though she did have an abnormal right toe-brachial index. She has complaints of claudication and cold feet.  - Will update LEA/ABI's to evaluate for PAD - Continue aspirin and statin  3. HTN: BP 130/74 today - Continue amlodipine and metoprolol  4. HLD: No recent lipids on file - Will have her present to [facility] for FLP/CMET at her convenience - Continue crestor  5. Recurrent prosthetic hip infection: follows with ID at [facility] Intolerant to doxycycline after trying this medication twice now. This is her primary complaint today. She self discontinued doxy yesterday.  - Encouraged her to contact her ID provider tomorrow for input on alternative suppressive antibiotics.     Current medicines are reviewed at length with the patient today.  The patient does not have concerns regarding medicines.  The following changes have been made:  As above  Labs/ tests ordered today include:   Orders Placed This Encounter  Procedures  . Comprehensive metabolic panel  . Lipid panel  .  EKG 12-Lead  . VAS [place] LOWER EXTREMITY ARTERIAL DUPLEX     Disposition:   FU with Dr. Sallyanne Kuster in 6 months  Signed, Abigail Butts, PA-C  06/26/2019 5:25 PM

## 2019-06-26 ENCOUNTER — Ambulatory Visit (INDEPENDENT_AMBULATORY_CARE_PROVIDER_SITE_OTHER): Payer: Medicare HMO | Admitting: Medical

## 2019-06-26 ENCOUNTER — Other Ambulatory Visit: Payer: Self-pay

## 2019-06-26 ENCOUNTER — Encounter: Payer: Self-pay | Admitting: Medical

## 2019-06-26 VITALS — BP 130/74 | HR 62 | Temp 97.3°F | Ht 59.0 in | Wt 102.6 lb

## 2019-06-26 DIAGNOSIS — I10 Essential (primary) hypertension: Secondary | ICD-10-CM

## 2019-06-26 DIAGNOSIS — T8459XS Infection and inflammatory reaction due to other internal joint prosthesis, sequela: Secondary | ICD-10-CM | POA: Diagnosis not present

## 2019-06-26 DIAGNOSIS — I2510 Atherosclerotic heart disease of native coronary artery without angina pectoris: Secondary | ICD-10-CM | POA: Diagnosis not present

## 2019-06-26 DIAGNOSIS — Z96642 Presence of left artificial hip joint: Secondary | ICD-10-CM

## 2019-06-26 DIAGNOSIS — E7800 Pure hypercholesterolemia, unspecified: Secondary | ICD-10-CM

## 2019-06-26 DIAGNOSIS — Z96649 Presence of unspecified artificial hip joint: Secondary | ICD-10-CM

## 2019-06-26 DIAGNOSIS — I739 Peripheral vascular disease, unspecified: Secondary | ICD-10-CM

## 2019-06-26 MED ORDER — METOPROLOL TARTRATE 25 MG PO TABS: 25.0000 mg | ORAL_TABLET | Freq: Two times a day (BID) | ORAL | 3 refills | Status: AC

## 2019-06-26 NOTE — Patient Instructions (Signed)
Medication Instructions:  CONTINUE WITH CURRENT MEDICATIONS. NO CHANGES. *If you need a refill on your cardiac medications before your next appointment, please call your pharmacy*   Lab Work: At your convenience-- fasting labs: cmet and lipids  If you have labs (blood work) drawn today and your tests are completely normal, you will receive your results only by: Marland Kitchen MyChart Message (if you have MyChart) OR . A paper copy in the mail If you have any lab test that is abnormal or we need to change your treatment, we will call you to review the results.   Testing/Procedures: Your physician has requested that you have a lower  extremity arterial duplex. This test is an ultrasound of the arteries in the legs . It looks at arterial blood flow in the legs . Allow one hour for Lower  Arterial scans. There are no restrictions or special instructions  [address]   Follow-Up: At [facility], you and your health needs are our priority.  As part of our continuing mission to provide you with exceptional heart care, we have created designated Provider Care Teams.  These Care Teams include your primary Cardiologist (physician) and Advanced Practice Providers (APPs -  Physician Assistants and Nurse Practitioners) who all work together to provide you with the care you need, when you need it.  We recommend signing up for the patient portal called "MyChart".  Sign up information is provided on this After Visit Summary.  MyChart is used to connect with patients for Virtual Visits (Telemedicine).  Patients are able to view lab/test results, encounter notes, upcoming appointments, etc.  Non-urgent messages can be sent to your provider as well.   To learn more about what you can do with MyChart, go to [email or web address].    Your next appointment:   6 month(s)  The format for your next appointment:   In Person  Provider:   Sanda Klein, MD

## 2019-06-30 LAB — COMPREHENSIVE METABOLIC PANEL
ALT: 20 IU/L (ref 0–32)
AST: 26 IU/L (ref 0–40)
Albumin/Globulin Ratio: 2.5 — ABNORMAL HIGH (ref 1.2–2.2)
Albumin: 5 g/dL — ABNORMAL HIGH (ref 3.6–4.6)
Alkaline Phosphatase: 89 IU/L (ref 39–117)
BUN/Creatinine Ratio: 12 (ref 12–28)
BUN: 8 mg/dL (ref 8–27)
Bilirubin Total: 0.4 mg/dL (ref 0.0–1.2)
CO2: 24 mmol/L (ref 20–29)
Calcium: 9.7 mg/dL (ref 8.7–10.3)
Chloride: 101 mmol/L (ref 96–106)
Creatinine, Ser: 0.65 mg/dL (ref 0.57–1.00)
GFR calc Af Amer: 94 mL/min/{1.73_m2} (ref >59)
GFR calc non Af Amer: 81 mL/min/{1.73_m2} (ref >59)
Globulin, Total: 2 g/dL (ref 1.5–4.5)
Glucose: 101 mg/dL — ABNORMAL HIGH (ref 65–99)
Potassium: 4.1 mmol/L (ref 3.5–5.2)
Sodium: 140 mmol/L (ref 134–144)
Total Protein: 7 g/dL (ref 6.0–8.5)

## 2019-06-30 LAB — LIPID PANEL
Chol/HDL Ratio: 2 ratio (ref 0.0–4.4)
Cholesterol, Total: 175 mg/dL (ref 100–199)
HDL: 87 mg/dL (ref >39)
LDL Chol Calc (NIH): 77 mg/dL (ref 0–99)
Triglycerides: 57 mg/dL (ref 0–149)
VLDL Cholesterol Cal: 11 mg/dL (ref 5–40)

## 2019-07-04 ENCOUNTER — Other Ambulatory Visit (HOSPITAL_COMMUNITY): Payer: Self-pay | Admitting: Medical

## 2019-07-04 DIAGNOSIS — I739 Peripheral vascular disease, unspecified: Secondary | ICD-10-CM

## 2019-07-06 ENCOUNTER — Other Ambulatory Visit: Payer: Self-pay

## 2019-07-06 MED ORDER — ROSUVASTATIN CALCIUM 20 MG PO TABS: 20.0000 mg | ORAL_TABLET | Freq: Every day | ORAL | 3 refills | Status: DC

## 2019-07-07 ENCOUNTER — Ambulatory Visit (HOSPITAL_COMMUNITY)
Admission: RE | Admit: 2019-07-07 | Discharge: 2019-07-07 | Disposition: A | Discharge status: Home / Self Care | Payer: Medicare HMO | Source: Ambulatory Visit | Attending: Internal Medicine | Admitting: Internal Medicine

## 2019-07-07 ENCOUNTER — Other Ambulatory Visit: Payer: Self-pay

## 2019-07-07 ENCOUNTER — Telehealth: Payer: Self-pay | Admitting: Medical

## 2019-07-07 DIAGNOSIS — I10 Essential (primary) hypertension: Secondary | ICD-10-CM

## 2019-07-07 DIAGNOSIS — I2510 Atherosclerotic heart disease of native coronary artery without angina pectoris: Secondary | ICD-10-CM

## 2019-07-07 DIAGNOSIS — I739 Peripheral vascular disease, unspecified: Secondary | ICD-10-CM | POA: Diagnosis not present

## 2019-07-07 DIAGNOSIS — E7800 Pure hypercholesterolemia, unspecified: Secondary | ICD-10-CM

## 2019-07-07 DIAGNOSIS — Z96649 Presence of unspecified artificial hip joint: Secondary | ICD-10-CM

## 2019-07-07 NOTE — Telephone Encounter (Signed)
Transferred call to Theresa Crosby 

## 2019-07-07 NOTE — Telephone Encounter (Signed)
Spoke with the pt and clarified her new Rosuvastatin dose.

## 2019-07-07 NOTE — Telephone Encounter (Signed)
LMTCB

## 2019-07-07 NOTE — Telephone Encounter (Signed)
New message   Patient has questions about whether she needs to be taking this medication and the dosage rosuvastatin (CRESTOR) 20 MG tablet. Please advise.

## 2019-12-28 ENCOUNTER — Ambulatory Visit (INDEPENDENT_AMBULATORY_CARE_PROVIDER_SITE_OTHER): Payer: Medicare HMO | Admitting: Cardiovascular Disease

## 2019-12-28 ENCOUNTER — Encounter: Payer: Self-pay | Admitting: Cardiovascular Disease

## 2019-12-28 ENCOUNTER — Other Ambulatory Visit: Payer: Self-pay

## 2019-12-28 VITALS — BP 122/63 | HR 72 | Ht 59.0 in | Wt 108.4 lb

## 2019-12-28 DIAGNOSIS — E7800 Pure hypercholesterolemia, unspecified: Secondary | ICD-10-CM | POA: Diagnosis not present

## 2019-12-28 DIAGNOSIS — I10 Essential (primary) hypertension: Secondary | ICD-10-CM

## 2019-12-28 DIAGNOSIS — I2510 Atherosclerotic heart disease of native coronary artery without angina pectoris: Secondary | ICD-10-CM | POA: Diagnosis not present

## 2019-12-28 LAB — LIPID PANEL
Chol/HDL Ratio: 2.1 ratio (ref 0.0–4.4)
Cholesterol, Total: 168 mg/dL (ref 100–199)
HDL: 79 mg/dL (ref >39)
LDL Chol Calc (NIH): 78 mg/dL (ref 0–99)
Triglycerides: 57 mg/dL (ref 0–149)
VLDL Cholesterol Cal: 11 mg/dL (ref 5–40)

## 2019-12-28 NOTE — Patient Instructions (Signed)
Medication Instructions:  No changes *If you need a refill on your cardiac medications before your next appointment, please call your pharmacy*   Lab Work: Your provider would like for you to have the following labs today: Lipid  If you have labs (blood work) drawn today and your tests are completely normal, you will receive your results only by: Marland Kitchen MyChart Message (if you have MyChart) OR . A paper copy in the mail If you have any lab test that is abnormal or we need to change your treatment, we will call you to review the results.   Testing/Procedures: None ordered   Follow-Up: At [facility], you and your health needs are our priority.  As part of our continuing mission to provide you with exceptional heart care, we have created designated Provider Care Teams.  These Care Teams include your primary Cardiologist (physician) and Advanced Practice Providers (APPs -  Physician Assistants and Nurse Practitioners) who all work together to provide you with the care you need, when you need it.  We recommend signing up for the patient portal called "MyChart".  Sign up information is provided on this After Visit Summary.  MyChart is used to connect with patients for Virtual Visits (Telemedicine).  Patients are able to view lab/test results, encounter notes, upcoming appointments, etc.  Non-urgent messages can be sent to your provider as well.   To learn more about what you can do with MyChart, go to [email or web address].    Your next appointment:   12 month(s)  The format for your next appointment:   In Person  Provider:   You may see Sanda Klein, MD or one of the following Advanced Practice Providers on your designated Care Team:    Almyra Deforest, PA-C  Fabian Sharp, PA-C or   Roby Lofts, [place]

## 2019-12-29 ENCOUNTER — Encounter: Payer: Self-pay | Admitting: *Deleted

## 2019-12-30 ENCOUNTER — Encounter: Payer: Self-pay | Admitting: Cardiovascular Disease

## 2019-12-30 NOTE — Progress Notes (Signed)
Cardiology Office Note    Date:  12/30/2019   ID:  Theresa Crosby, DOB Jul 13, 1932, MRN [id number]  PCP:  Francesca Oman, DO  Cardiologist:  Dr. Sallyanne Kuster   Chief Complaint  Patient presents with  . Coronary Artery Disease    History of Present Illness:  Theresa Crosby is a 86 y.o. female with PMH of CAD s/p CABG 2013 (LIMA-LAD, SVG-OM2, SVG-Diagonal, SVG-RCA), HTN, and HLD.  Last Myoview obtained on 02/06/2015 showed EF 87%, small defect of mild severity present in the apex location, overall low risk study.  Previous heart monitor obtained on 08/26/2016 showed normal sinus rhythm, very rare PACs, rare relatively little increase in the heart rate during the day concerning for beta-blocker induced chronotropic incompetence.  She had symptoms of heart flutter that occurred in association with normal sinus rhythm.  Echocardiogram in April 2019 showed grade 2 DD, EF 60 to 65%, peak PA pressure 34 mmHg.  She had a normal nuclear stress test in August 2020.  She had normal ABI in 2019.  From a cardiac point of view she is doing very well.  Activity is limited by orthopedic symptoms, but she has not had any angina or dyspnea with activity.  She denies orthopnea, PND, leg edema, claudication, palpitations, dizziness or syncope, focal neurological events.  She still has a tender area of keloid scar in the middle portion of her sternotomy, but this has settled down at the top and the bottom of the scar.  Her most recent lipid profile showed an LDL cholesterol that was close to target at 77 in May 2021, but also an outstanding HDL cholesterol of 87.  She does not have diabetes mellitus and does not smoke.  Past Medical History:  Diagnosis Date  . Abnormal nuclear stress test 10/05/2011  . Anxiety    2005- related to care of aging parent   . Arthritis    degeneration - spondylosis, OA - all over body,   . Blood transfusion    postop- back surg. 1987  . Bunion   . CAD (coronary artery  disease)significant diseas w/ 99% LAD, 80% D1, 80% LCX 10/05/2011  . GERD (gastroesophageal reflux disease)   . Hyperlipemia   . Hypertension    ireg. heartbeat, no referral made to cardiologist , sees [place].   . Neuromuscular disorder (HCC)    burning- L leg , numbing of toes   . S/P CABG x 4, LIMA-LAD, SVG-OM2, SVG-diag, SVG-RCA 10/05/2011  . Shortness of breath     Past Surgical History:  Procedure Laterality Date  . ABDOMINAL HYSTERECTOMY     partial hysterectomy- 1972  . ANTERIOR APPROACH HEMI HIP ARTHROPLASTY Left 06/26/2016   Procedure: ANTERIOR APPROACH TOTAL HIP ARTHROPLASTY;  Surgeon: Leandrew Koyanagi, MD;  Location: [facility];  Service: Orthopedics;  Laterality: Left;  . BACK SURGERY     1987-   . BREAST CYST ASPIRATION Left 06/18/2010  . CARDIAC CATHETERIZATION  10/12/2011   high-grade ca+ prox. LAD diagonal branch,higly diseased diagonal branch & obtuse marginal branch  . CORONARY ARTERY BYPASS GRAFT  10/04/2011   Procedure: CORONARY ARTERY BYPASS GRAFTING (CABG);  Surgeon: Gaye Pollack, MD;  Location: [facility];  Service: Open Heart Surgery;  Laterality: N/A;  CABG x four using left internal mammary artery and right leg greater saphenous vein   . EYE SURGERY     cataracts removed- bilateral, no IOL  . FOOT SURGERY Right January 2013   HT 5  .  LEFT HEART CATHETERIZATION WITH CORONARY ANGIOGRAM N/A 10/02/2011   Procedure: LEFT HEART CATHETERIZATION WITH CORONARY ANGIOGRAM;  Surgeon: Lorretta Harp, MD;  Location: [facility] CATH LAB;  Service: Cardiovascular;  Laterality: N/A;  . LUMBAR LAMINECTOMY/DECOMPRESSION MICRODISCECTOMY  03/04/2011   Procedure: LUMBAR LAMINECTOMY/DECOMPRESSION MICRODISCECTOMY;  Surgeon: Hosie Spangle, MD;  Location: [place] [facility]URO OR[facility];  Service: Neurosurgery;  Laterality: N/A;  Thoracic Ten-Thoracic Twelve Thoracic Laminectomy  . NM MYOVIEW LTD  09/15/2011   mild ischemia mid anterior & apical anterior region.EF: 86%  . TOE SURGERY     R foot- removed calus between  5-4  . TUBAL LIGATION      Current Medications: Outpatient Medications Prior to Visit  Medication Sig Dispense Refill  . acetaminophen (TYLENOL) 325 MG tablet Take 1 or 2 tablets every 6 hours as needed for pain    . amLODipine (NORVASC) 5 MG tablet Take 2.5 mg by mouth. Pt takes 10mg  daily    . aspirin EC 81 MG tablet Take 1 tablet (81 mg total) by mouth daily. 100 tablet 0  . Calcium-Vitamin D-Vitamin K (CALCIUM SOFT CHEWS PO) Take by mouth daily.    . Cholecalciferol (VITAMIN D3) 1000 UNITS CAPS Take 2,000 Units by mouth every morning.      . dorzolamide-timolol (COSOPT) 22.3-6.8 MG/ML ophthalmic solution Place 1 drop into both eyes 2 (two) times daily.  5  . loratadine (CLARITIN) 10 MG tablet Take 10 mg by mouth daily as needed for allergies.    . metoprolol tartrate (LOPRESSOR) 25 MG tablet Take 1 tablet (25 mg total) by mouth 2 (two) times daily. 90 tablet 3  . rosuvastatin (CRESTOR) 20 MG tablet Take 1 tablet (20 mg total) by mouth daily. 90 tablet 3  . magnesium oxide (MAG-OX) 400 MG tablet Take 400 mg by mouth daily. (Patient not taking: Reported on 12/28/2019)    . omeprazole (PRILOSEC) 20 MG capsule Take 20 mg by mouth daily. (Patient not taking: Reported on 12/28/2019)     No facility-administered medications prior to visit.     Allergies:   Aspirin and Lactose intolerance (gi)   Social History   Socioeconomic History  . Marital status: Widowed    Spouse name: Not on file  . Number of children: Not on file  . Years of education: Not on file  . Highest education level: Not on file  Occupational History  . Occupation: Was working at [employer]  . Smoking status: Never Smoker  . Smokeless tobacco: Never Used  Vaping Use  . Vaping Use: Never used  Substance and Sexual Activity  . Alcohol use: No    Alcohol/week: 0.0 standard drinks  . Drug use: No  . Sexual activity: Not on file  Other Topics Concern  . Not on file  Social History  Narrative   Lives alone in an apartment.    Social Determinants of Health   Financial Resource Strain:   . Difficulty of Paying Living Expenses: Not on file  Food Insecurity:   . Worried About Charity fundraiser in the Last Year: Not on file  . Ran Out of Food in the Last Year: Not on file  Transportation Needs:   . Lack of Transportation (Medical): Not on file  . Lack of Transportation (Non-Medical): Not on file  Physical Activity:   . Days of Exercise per Week: Not on file  . Minutes of Exercise per Session: Not on file  Stress:   . Feeling of Stress :  Not on file  Social Connections:   . Frequency of Communication with Friends and Family: Not on file  . Frequency of Social Gatherings with Friends and Family: Not on file  . Attends Religious Services: Not on file  . Active Member of Clubs or Organizations: Not on file  . Attends Archivist Meetings: Not on file  . Marital Status: Not on file     Family History:  The patient's family history includes Heart attack in her brother, father, and mother; Hypertension in her father.   ROS:   Please see the history of present illness.    ROS All other systems are reviewed and are negative.   PHYSICAL EXAM:   VS:  BP 122/63 (BP Location: Left Arm, Patient Position: Sitting)   Pulse 72   Ht 4\' 11"  (1.499 m)   Wt 108 lb 6.4 oz (49.2 kg)   SpO2 92%   BMI 21.89 kg/m      General: Alert, oriented x3, no distress, very lean Head: no evidence of trauma, PERRL, EOMI, no exophtalmos or lid lag, no myxedema, no xanthelasma; normal ears, nose and oropharynx Neck: normal jugular venous pulsations and no hepatojugular reflux; brisk carotid pulses without delay and no carotid bruits Chest: clear to auscultation, no signs of consolidation by percussion or palpation, normal fremitus, symmetrical and full respiratory excursions Cardiovascular: normal position and quality of the apical impulse, regular rhythm, normal first and second  heart sounds, no murmurs, rubs or gallops.  Exophytic purpleish keloid in the middle third of her sternotomy scar. Abdomen: no tenderness or distention, no masses by palpation, no abnormal pulsatility or arterial bruits, normal bowel sounds, no hepatosplenomegaly Extremities: no clubbing, cyanosis or edema; 2+ radial, ulnar and brachial pulses bilaterally; 2+ right femoral, posterior tibial and dorsalis pedis pulses; 2+ left femoral, posterior tibial and dorsalis pedis pulses; no subclavian or femoral bruits Neurological: grossly nonfocal Psych: Normal mood and affect    Wt Readings from Last 3 Encounters:  12/28/19 108 lb 6.4 oz (49.2 kg)  06/26/19 102 lb 9.6 oz (46.5 kg)  12/09/18 103 lb (46.7 kg)      Studies/Labs Reviewed:   EKG:  EKG is not ordered today.  The tracing from 06/26/2019 is reviewed and and shows normal sinus rhythm, normal tracing Recent Labs: 06/29/2019: ALT 20; BUN 8; Creatinine, Ser 0.65; Potassium 4.1; Sodium 140   Lipid Panel    Component Value Date/Time   CHOL 168 12/28/2019 1016   TRIG 57 12/28/2019 1016   HDL 79 12/28/2019 1016   CHOLHDL 2.1 12/28/2019 1016   CHOLHDL 1.9 10/30/2015 1405   VLDL 12 10/30/2015 1405   LDLCALC 78 12/28/2019 1016    Additional studies/ records that were reviewed today include:    Myoview 02/06/2015 Study Highlights   Nuclear stress EF: 87%.  There was no ST segment deviation noted during stress.  Defect 1: There is a small defect of mild severity present in the apex location.  This is a low risk study.   Low risk stress nuclear study with a small and fixed apical abnormality. Prior to CABG, the nuclear study showed a reversible defect in the same territory. Wall motion is normal in this area: may represent scar or attenuation artifact. Normal left ventricular regional and global systolic function.      ASSESSMENT:    1. Coronary artery disease involving native coronary artery of native heart without angina  pectoris   2. Essential hypertension   3. Hypercholesterolemia  PLAN:  In order of problems listed above:  1. CAD s/p CABG: Asymptomatic.  Aspirin, statin and beta-blocker. 2. Hypertension: Well-controlled on current medications.  No changes recommended. 3. Hyperlipidemia: Although the LDL cholesterol is just a little bit shy of target (less than 70), her HDL cholesterol is outstanding.  No changes were made to her medications today.    Medication Adjustments/Labs and Tests Ordered: Current medicines are reviewed at length with the patient today.  Concerns regarding medicines are outlined above.  Medication changes, Labs and Tests ordered today are listed in the Patient Instructions below. Patient Instructions  Medication Instructions:  No changes *If you need a refill on your cardiac medications before your next appointment, please call your pharmacy*   Lab Work: Your provider would like for you to have the following labs today: Lipid  If you have labs (blood work) drawn today and your tests are completely normal, you will receive your results only by: Marland Kitchen MyChart Message (if you have MyChart) OR . A paper copy in the mail If you have any lab test that is abnormal or we need to change your treatment, we will call you to review the results.   Testing/Procedures: None ordered   Follow-Up: At [facility], you and your health needs are our priority.  As part of our continuing mission to provide you with exceptional heart care, we have created designated Provider Care Teams.  These Care Teams include your primary Cardiologist (physician) and Advanced Practice Providers (APPs -  Physician Assistants and Nurse Practitioners) who all work together to provide you with the care you need, when you need it.  We recommend signing up for the patient portal called "MyChart".  Sign up information is provided on this After Visit Summary.  MyChart is used to connect with patients for  Virtual Visits (Telemedicine).  Patients are able to view lab/test results, encounter notes, upcoming appointments, etc.  Non-urgent messages can be sent to your provider as well.   To learn more about what you can do with MyChart, go to [email or web address].    Your next appointment:   12 month(s)  The format for your next appointment:   In Person  Provider:   You may see Sanda Klein, MD or one of the following Advanced Practice Providers on your designated Care Team:    Almyra Deforest, PA-C  Fabian Sharp, [place] or   Roby Lofts, PA-C        Signed, Sanda Klein, MD  12/30/2019 3:25 PM    [address], [place], [place]  [address] Phone: [phone]; Fax: [phone]

## 2020-09-02 ENCOUNTER — Telehealth: Payer: Self-pay | Admitting: Cardiovascular Disease

## 2020-09-02 DIAGNOSIS — Z79899 Other long term (current) drug therapy: Secondary | ICD-10-CM

## 2020-09-02 NOTE — Telephone Encounter (Signed)
Attempted to call patient, left message for patient to call back to office.   

## 2020-09-02 NOTE — Telephone Encounter (Signed)
Returned call to patient, made patient aware of Dr. Victorino December recommendations. Patient made aware of Lab instructions and will come to office when able to have these drawn.   Advised patient to call back to office with any issues, questions, or concerns. Patient verbalized understanding.   Lab orders placed.

## 2020-09-02 NOTE — Telephone Encounter (Signed)
Returned call to patient who states that she was calling in regards to being cold all the time. Patient states that she stays freezing all the time, and does not run [facility] in her home due to this. Patient reports that she sleeps with multiple blankets and just has a hard time ever getting warm. Patient reports that she saw her PCP on 7/11 and reported this issue to them, and she was going to have labs drawn but states that the phlebotomist had a hard time getting any blood and states that she never heard back from them regarding these so she feels that they were not run. Patient states that she has also been slightly short of breath which is worse when she lays down. Patient states that her nose stops up when she lies down and has a hard time breathing due to this. Patient states it's hard to tell if the shortness of breath is related to only her sinuses or cardiac related. Patient states that she only has swelling in the R ankle in which she has been seeing a foot doctor for injections, compression stockings and treatment for this. Patient denies any other swelling. Patient reports that she is also slightly dizzy upon changing positions at times, but denies any presyncope or syncope. Patient denies any bleeding issues, and denies black/tarry stools. Patient reports that her last BP was 133/70 and HR 63- she reports that this is her baseline. Patient also denies any recent thyroid blood work as well. Patient would like to know what Dr. Loletha Grayer thinks and if he would like to see her in office or would like any labs. Advised patient that I would forward message to him for review and advice. Patient verbalized understanding.

## 2020-09-02 NOTE — Telephone Encounter (Signed)
Pt is returning phone call, please advise

## 2020-09-02 NOTE — Telephone Encounter (Signed)
New Message:     Patient says she is freezing cold. This been going on for 3 or 4 months. She says it is getting worse. She have to wear coats and sweaters during the day and  at night she have to have blankets. Room temperature water makes her shiver. Patient says she thinks she need to be seen.

## 2020-09-02 NOTE — Addendum Note (Signed)
Addended by: Rexanne Mano B on: 09/02/2020 02:43 PM   Modules accepted: Orders

## 2020-09-03 NOTE — Telephone Encounter (Signed)
Patient called again and said she will be coming in tomorrow (09/04/20) to have her labs drawn. She is concerned about her dry mouth and was hoping to be worked in any way possible

## 2020-09-04 LAB — CBC
Hematocrit: 41.7 % (ref 34.0–46.6)
Hemoglobin: 13.5 g/dL (ref 11.1–15.9)
MCH: 29.5 pg (ref 26.6–33.0)
MCHC: 32.4 g/dL (ref 31.5–35.7)
MCV: 91 fL (ref 79–97)
Platelets: 235 10*3/uL (ref 150–450)
RBC: 4.57 x10E6/uL (ref 3.77–5.28)
RDW: 14 % (ref 11.7–15.4)
WBC: 4.9 10*3/uL (ref 3.4–10.8)

## 2020-09-04 LAB — T4, FREE: Free T4: 1.29 ng/dL (ref 0.82–1.77)

## 2020-09-04 LAB — TSH: TSH: 1.4 u[IU]/mL (ref 0.450–4.500)

## 2020-09-04 NOTE — Telephone Encounter (Signed)
Left a message for the patient to call back. She had labs today completed today. We will call her when the results are in

## 2020-09-06 NOTE — Telephone Encounter (Addendum)
Patient made aware of results and verbalized understanding. She did staet that she was feeling better now  Croitoru, Dani Gobble, MD  Ricci Barker, RN Normal thyroid tests and not anemic. I do not have an explanation for her complaints of feeling cold all the time, but I doubt they are heart related.

## 2020-11-22 ENCOUNTER — Emergency Department (HOSPITAL_BASED_OUTPATIENT_CLINIC_OR_DEPARTMENT_OTHER): Payer: Medicare HMO

## 2020-11-22 ENCOUNTER — Encounter (HOSPITAL_BASED_OUTPATIENT_CLINIC_OR_DEPARTMENT_OTHER): Payer: Self-pay

## 2020-11-22 ENCOUNTER — Other Ambulatory Visit: Payer: Self-pay

## 2020-11-22 ENCOUNTER — Emergency Department (HOSPITAL_BASED_OUTPATIENT_CLINIC_OR_DEPARTMENT_OTHER)
Admission: EM | Admit: 2020-11-22 | Discharge: 2020-11-22 | Disposition: A | Discharge status: Home / Self Care | Payer: Medicare HMO | Attending: Emergency Medicine | Admitting: Emergency Medicine

## 2020-11-22 DIAGNOSIS — Z79899 Other long term (current) drug therapy: Secondary | ICD-10-CM | POA: Diagnosis not present

## 2020-11-22 DIAGNOSIS — J45909 Unspecified asthma, uncomplicated: Secondary | ICD-10-CM | POA: Diagnosis not present

## 2020-11-22 DIAGNOSIS — Z951 Presence of aortocoronary bypass graft: Secondary | ICD-10-CM | POA: Insufficient documentation

## 2020-11-22 DIAGNOSIS — Z7982 Long term (current) use of aspirin: Secondary | ICD-10-CM | POA: Insufficient documentation

## 2020-11-22 DIAGNOSIS — I10 Essential (primary) hypertension: Secondary | ICD-10-CM | POA: Insufficient documentation

## 2020-11-22 DIAGNOSIS — K5900 Constipation, unspecified: Secondary | ICD-10-CM | POA: Diagnosis not present

## 2020-11-22 DIAGNOSIS — I2510 Atherosclerotic heart disease of native coronary artery without angina pectoris: Secondary | ICD-10-CM | POA: Diagnosis not present

## 2020-11-22 DIAGNOSIS — R109 Unspecified abdominal pain: Secondary | ICD-10-CM | POA: Diagnosis present

## 2020-11-22 DIAGNOSIS — R1084 Generalized abdominal pain: Secondary | ICD-10-CM

## 2020-11-22 LAB — COMPREHENSIVE METABOLIC PANEL
ALT: 19 U/L (ref 0–44)
AST: 31 U/L (ref 15–41)
Albumin: 4.4 g/dL (ref 3.5–5.0)
Alkaline Phosphatase: 74 U/L (ref 38–126)
Anion gap: 6 (ref 5–15)
BUN: 10 mg/dL (ref 8–23)
CO2: 29 mmol/L (ref 22–32)
Calcium: 9.3 mg/dL (ref 8.9–10.3)
Chloride: 100 mmol/L (ref 98–111)
Creatinine, Ser: 0.61 mg/dL (ref 0.44–1.00)
GFR, Estimated: >60 mL/min (ref >60)
Glucose, Bld: 105 mg/dL — ABNORMAL HIGH (ref 70–99)
Potassium: 3.6 mmol/L (ref 3.5–5.1)
Sodium: 135 mmol/L (ref 135–145)
Total Bilirubin: 0.6 mg/dL (ref 0.3–1.2)
Total Protein: 7.3 g/dL (ref 6.5–8.1)

## 2020-11-22 LAB — CBC WITH DIFFERENTIAL/PLATELET
Abs Immature Granulocytes: 0.01 10*3/uL (ref 0.00–0.07)
Basophils Absolute: 0 10*3/uL (ref 0.0–0.1)
Basophils Relative: 0 %
Eosinophils Absolute: 0.1 10*3/uL (ref 0.0–0.5)
Eosinophils Relative: 1 %
HCT: 41.9 % (ref 36.0–46.0)
Hemoglobin: 13.8 g/dL (ref 12.0–15.0)
Immature Granulocytes: 0 %
Lymphocytes Relative: 35 %
Lymphs Abs: 2.3 10*3/uL (ref 0.7–4.0)
MCH: 29.6 pg (ref 26.0–34.0)
MCHC: 32.9 g/dL (ref 30.0–36.0)
MCV: 89.9 fL (ref 80.0–100.0)
Monocytes Absolute: 0.8 10*3/uL (ref 0.1–1.0)
Monocytes Relative: 12 %
Neutro Abs: 3.3 10*3/uL (ref 1.7–7.7)
Neutrophils Relative %: 52 %
Platelets: 242 10*3/uL (ref 150–400)
RBC: 4.66 MIL/uL (ref 3.87–5.11)
RDW: 15.9 % — ABNORMAL HIGH (ref 11.5–15.5)
WBC: 6.5 10*3/uL (ref 4.0–10.5)
nRBC: 0 % (ref 0.0–0.2)

## 2020-11-22 LAB — URINALYSIS, ROUTINE W REFLEX MICROSCOPIC
Bilirubin Urine: NEGATIVE
Glucose, UA: NEGATIVE mg/dL
Hgb urine dipstick: NEGATIVE
Nitrite: NEGATIVE
Protein, ur: NEGATIVE mg/dL
Specific Gravity, Urine: 1.015 (ref 1.005–1.030)
pH: 7 (ref 5.0–8.0)

## 2020-11-22 LAB — URINALYSIS, MICROSCOPIC (REFLEX)

## 2020-11-22 LAB — LIPASE, BLOOD: Lipase: 48 U/L (ref 11–51)

## 2020-11-22 MED ORDER — OMEPRAZOLE 20 MG PO CPDR: 20.0000 mg | DELAYED_RELEASE_CAPSULE | Freq: Every day | ORAL | 0 refills | Status: AC

## 2020-11-22 MED ORDER — IOHEXOL 300 MG/ML  SOLN
100.0000 mL | Freq: Once | INTRAMUSCULAR | Status: AC | PRN
  Administered 2020-11-22: 100 mL via INTRAVENOUS

## 2020-11-22 MED ORDER — SUCRALFATE 1 G PO TABS: 1.0000 g | ORAL_TABLET | Freq: Three times a day (TID) | ORAL | 0 refills | Status: DC

## 2020-11-22 MED ORDER — ONDANSETRON HCL 4 MG/2ML IJ SOLN
4.0000 mg | Freq: Once | INTRAMUSCULAR | Status: AC
  Administered 2020-11-22: 4 mg via INTRAVENOUS
  Filled 2020-11-22: qty 2

## 2020-11-22 MED ORDER — LACTATED RINGERS IV BOLUS
1000.0000 mL | Freq: Once | INTRAVENOUS | Status: AC
  Administered 2020-11-22: 1000 mL via INTRAVENOUS

## 2020-11-22 NOTE — ED Provider Notes (Signed)
[facility] Provider Note   CSN: [id number] Arrival date & time: 11/22/20  1936     History Chief Complaint  Patient presents with   Abdominal Pain    Theresa Crosby is a 87 y.o. female.  Patient is an 87 year old female with a history of hypertension, hyperlipidemia, CAD status post CABG, GERD who is presenting today with complaints of abdominal pain.  Patient reports the abdominal pain is been present for 1 to 2 months and causes pain throughout her abdomen.  It is worse with lying down in the middle of the night it seems to keep her awake.  However it is hurting throughout the day as well.  She initially reports the pain is in the umbilical region but then changes and reports the pain is everywhere.  It does not radiate into the back or down into the groin.  It is not affected by urinating.  She has difficulty having bowel movements and has just small infrequent stools.  She will try MiraLAX and stool softeners and sometimes will have diarrhea but then goes right back to small hard stools.  She feels that eating makes the abdominal pain worse so she has decreased the amount she has eaten and has noted losing approximately 5 to 6 pounds in the last few months.  She has not had any vomiting but does have nausea intermittently.  She has had intermittent dysuria for a while.  However no fevers, cough or congestion.  Certain positions also make the pain worse.  She has followed up with a gastroenterologist at [facility] and has had an endoscopy where her esophagus was stretched but that she does not know that there was anything else found.  She also reports she had an ultrasound of her abdomen which was okay.  The history is provided by the patient.  Abdominal Pain     Past Medical History:  Diagnosis Date   Abnormal nuclear stress test 10/05/2011   Anxiety    2005- related to care of aging parent    Arthritis    degeneration - spondylosis, OA -  all over body,    Blood transfusion    postop- back surg. 1987   Bunion    CAD (coronary artery disease)significant diseas w/ 99% LAD, 80% D1, 80% LCX 10/05/2011   GERD (gastroesophageal reflux disease)    Hyperlipemia    Hypertension    ireg. heartbeat, no referral made to cardiologist , sees [facility].    Neuromuscular disorder ([place])    burning- L leg , numbing of toes    S/P CABG x 4, LIMA-LAD, SVG-OM2, SVG-diag, SVG-RCA 10/05/2011   Shortness of breath     Patient Active Problem List   Diagnosis Date Noted   Pain in left hip 07/13/2017   Status post left hip replacement 05/10/2017   Trochanteric bursitis, left hip 03/18/2017   Sciatica, left side 03/18/2017   Presence of right artificial hip joint 01/18/2017   Left displaced femoral neck fracture ([place]) 06/26/2016   Hypertension, essential 06/26/2016   HLD (hyperlipidemia) 06/26/2016   Coronary heart disease 06/26/2016   Left ventricular diastolic dysfunction 16/08/3708   Asthma 06/26/2016   Left shoulder pain 12/10/2014   Pain in toe of left foot 02/13/2014   Pain in lower limb 12/18/2013   Ingrown nail 11/30/2012   Pain in joint, ankle and foot 08/30/2012   Onychomycosis due to dermatophyte 05/30/2012   Chest pain 11/02/2011   Oral thrush 11/02/2011  Neuropathy, lower extremities 10/07/2011   HTN (hypertension) 10/07/2011   Dyslipidemia 10/07/2011   Chest pain at rest associated with dyspnea 10/05/2011   Abnormal nuclear stress test 10/05/2011   CAD s/p CABG x 4 2013 10/05/2011   S/P CABG x 4, LIMA-LAD, SVG-OM2, SVG-diag, SVG-RCA  10/04/11 10/05/2011    Past Surgical History:  Procedure Laterality Date   ABDOMINAL HYSTERECTOMY     partial hysterectomy- 1972   ANTERIOR APPROACH HEMI HIP ARTHROPLASTY Left 06/26/2016   Procedure: ANTERIOR APPROACH TOTAL HIP ARTHROPLASTY;  Surgeon: Leandrew Koyanagi, MD;  Location: [place];  Service: Orthopedics;  Laterality: Left;   BACK SURGERY     1987-    BREAST CYST ASPIRATION Left  06/18/2010   CARDIAC CATHETERIZATION  10/12/2011   high-grade ca+ prox. LAD diagonal branch,higly diseased diagonal branch & obtuse marginal branch   CORONARY ARTERY BYPASS GRAFT  10/04/2011   Procedure: CORONARY ARTERY BYPASS GRAFTING (CABG);  Surgeon: Gaye Pollack, MD;  Location: [facility];  Service: Open Heart Surgery;  Laterality: N/A;  CABG x four using left internal mammary artery and right leg greater saphenous vein    EYE SURGERY     cataracts removed- bilateral, no IOL   FOOT SURGERY Right January 2013   HT 5   LEFT HEART CATHETERIZATION WITH CORONARY ANGIOGRAM N/A 10/02/2011   Procedure: LEFT HEART CATHETERIZATION WITH CORONARY ANGIOGRAM;  Surgeon: Lorretta Harp, MD;  Location: [facility] CATH LAB;  Service: Cardiovascular;  Laterality: N/A;   LUMBAR LAMINECTOMY/DECOMPRESSION MICRODISCECTOMY  03/04/2011   Procedure: LUMBAR LAMINECTOMY/DECOMPRESSION MICRODISCECTOMY;  Surgeon: Hosie Spangle, MD;  Location: [facility];  Service: Neurosurgery;  Laterality: N/A;  Thoracic Ten-Thoracic Twelve Thoracic Laminectomy   NM MYOVIEW LTD  09/15/2011   mild ischemia mid anterior & apical anterior region.EF: 86%   TOE SURGERY     R foot- removed calus between 5-4   TUBAL LIGATION       OB History   No obstetric history on file.     Family History  Problem Relation Age of Onset   Heart attack Mother    Heart attack Father    Hypertension Father    Heart attack Brother    Anesthesia problems Neg Hx    Hypotension Neg Hx    Malignant hyperthermia Neg Hx    Pseudochol deficiency Neg Hx     Social History   Tobacco Use   Smoking status: Never   Smokeless tobacco: Never  Vaping Use   Vaping Use: Never used  Substance Use Topics   Alcohol use: No    Alcohol/week: 0.0 standard drinks   Drug use: No    Home Medications Prior to Admission medications   Medication Sig Start Date End Date Taking? Authorizing Provider  sucralfate (CARAFATE) 1 g tablet Take 1 tablet (1 g total) by mouth 4  (four) times daily -  with meals and at bedtime for 14 days. 11/22/20 12/06/20 Yes Sonali Wivell, Loree Fee, MD  acetaminophen (TYLENOL) 325 MG tablet Take 1 or 2 tablets every 6 hours as needed for pain    [provider]  amLODipine (NORVASC) 5 MG tablet Take 2.5 mg by mouth. Pt takes 10mg  daily 09/09/18   [provider]  aspirin EC 81 MG tablet Take 1 tablet (81 mg total) by mouth daily. 11/02/11   Monika Salk, MD  Calcium-Vitamin D-Vitamin K (CALCIUM SOFT CHEWS PO) Take by mouth daily.    [provider]  Cholecalciferol (VITAMIN D3) 1000 UNITS CAPS  Take 2,000 Units by mouth every morning.      [provider]  dorzolamide-timolol (COSOPT) 22.3-6.8 MG/ML ophthalmic solution Place 1 drop into both eyes 2 (two) times daily. 06/10/16   [provider]  loratadine (CLARITIN) 10 MG tablet Take 10 mg by mouth daily as needed for allergies.    [provider]  magnesium oxide (MAG-OX) 400 MG tablet Take 400 mg by mouth daily. Patient not taking: Reported on 12/28/2019    [provider]  metoprolol tartrate (LOPRESSOR) 25 MG tablet Take 1 tablet (25 mg total) by mouth 2 (two) times daily. 06/26/19   Kroeger, Lorelee Cover., PA-C  omeprazole (PRILOSEC) 20 MG capsule Take 1 capsule (20 mg total) by mouth daily. 11/22/20   Blanchie Dessert, MD  rosuvastatin (CRESTOR) 20 MG tablet Take 1 tablet (20 mg total) by mouth daily. 07/06/19   Kroeger, Lorelee Cover., PA-C    Allergies    Aspirin and Lactose intolerance (gi)  Review of Systems   Review of Systems  Gastrointestinal:  Positive for abdominal pain.  All other systems reviewed and are negative.  Physical Exam Updated Vital Signs BP (!) 154/85 (BP Location: Right Arm)   Pulse 91   Temp 98.4 F (36.9 C) (Oral)   Resp 16   Ht 4\' 11"  (1.499 m)   Wt 46.3 kg   SpO2 100%   BMI 20.60 kg/m   Physical Exam Vitals and nursing note reviewed.  Constitutional:      General: She is not in acute  distress.    Appearance: She is well-developed.  HENT:     Head: Normocephalic and atraumatic.  Eyes:     Conjunctiva/sclera: Conjunctivae normal.     Pupils: Pupils are equal, round, and reactive to light.  Cardiovascular:     Rate and Rhythm: Normal rate and regular rhythm.     Pulses: Normal pulses.     Heart sounds: No murmur heard. Pulmonary:     Effort: Pulmonary effort is normal. No respiratory distress.     Breath sounds: Normal breath sounds. No wheezing or rales.  Abdominal:     General: Abdomen is flat. Bowel sounds are normal. There is no distension.     Palpations: Abdomen is soft.     Tenderness: There is generalized abdominal tenderness. There is guarding. There is no right CVA tenderness, left CVA tenderness or rebound.     Comments: Multiple surgical scars over the abdomen and chest that are tender to palpation  Musculoskeletal:        General: No tenderness. Normal range of motion.     Cervical back: Normal range of motion and neck supple.     Right lower leg: No edema.     Left lower leg: No edema.  Skin:    General: Skin is warm and dry.     Findings: No erythema or rash.  Neurological:     Mental Status: She is alert and oriented to person, place, and time. Mental status is at baseline.     Sensory: No sensory deficit.     Motor: No weakness.     Gait: Gait normal.  Psychiatric:        Mood and Affect: Mood normal.        Behavior: Behavior normal.    ED Results / Procedures / Treatments   Labs (all labs ordered are listed, but only abnormal results are displayed) Labs Reviewed  CBC WITH DIFFERENTIAL/PLATELET - Abnormal; Notable for the following components:  Result Value   RDW 15.9 (*)    All other components within normal limits  COMPREHENSIVE METABOLIC PANEL - Abnormal; Notable for the following components:   Glucose, Bld 105 (*)    All other components within normal limits  URINALYSIS, ROUTINE W REFLEX MICROSCOPIC - Abnormal; Notable for  the following components:   Ketones, ur TRACE (*)    Leukocytes,Ua TRACE (*)    All other components within normal limits  URINALYSIS, MICROSCOPIC (REFLEX) - Abnormal; Notable for the following components:   Bacteria, UA RARE (*)    All other components within normal limits  LIPASE, BLOOD    EKG None  Radiology CT ABDOMEN PELVIS W CONTRAST  Result Date: 11/22/2020 CLINICAL DATA:  Acute nonlocalized abdominal pain, abdominal pain and constipation for 1-2 months. History coronary artery disease post CABG x 4, hypertension EXAM: CT ABDOMEN AND PELVIS WITH CONTRAST TECHNIQUE: Multidetector CT imaging of the abdomen and pelvis was performed using the standard protocol following bolus administration of intravenous contrast. Sagittal and coronal MPR images reconstructed from axial data set. Delayed images through the kidneys were not obtained due to patient anxiety, unable to complete exam. CONTRAST:  171mL OMNIPAQUE IOHEXOL 300 MG/ML SOLN IV. No oral contrast. COMPARISON:  02/18/2016 FINDINGS: Lower chest: Minimal scarring posterior RIGHT lower lobe base. Remaining lung bases clear. Posterior RIGHT diaphragmatic hernia containing fat. Hepatobiliary: Gallbladder and liver normal appearance Pancreas: Normal appearance Spleen: Normal appearance Adrenals/Urinary Tract: Probable tiny LEFT renal cyst. Adrenal glands, kidneys, visualized ureters, and bladder otherwise normal appearance. Stomach/Bowel: Appendix not visualized. No pericecal inflammatory process seen. Stool throughout colon. Stomach and bowel loops unremarkable. Vascular/Lymphatic: Atherosclerotic calcifications aorta, iliac arteries, visceral artery origins. Aorta normal caliber. No adenopathy. Reproductive: Uterus surgically absent.  Ovaries not visualized. Other: No free air or free fluid. No hernia or inflammatory process. Musculoskeletal: Osseous demineralization. Beam hardening artifacts in pelvis secondary to LEFT hip prosthesis. Small  sclerotic focus within RIGHT iliac bone unchanged, likely benign. Scattered degenerative disc and facet disease changes lumbar spine. IMPRESSION: No acute intra-abdominal or intrapelvic abnormalities. Small posterior RIGHT diaphragmatic hernia containing fat. Aortic Atherosclerosis (ICD10-I70.0). Electronically Signed   By: Lavonia Dana M.D.   On: 11/22/2020 21:53    Procedures Procedures   Medications Ordered in ED Medications  lactated ringers bolus 1,000 mL (1,000 mLs Intravenous New Bag/Given 11/22/20 2048)  ondansetron (ZOFRAN) injection 4 mg (4 mg Intravenous Given 11/22/20 2046)  iohexol (OMNIPAQUE) 300 MG/ML solution 100 mL (100 mLs Intravenous Contrast Given 11/22/20 2141)    ED Course  I have reviewed the triage vital signs and the nursing notes.  Pertinent labs & imaging results that were available during my care of the patient were reviewed by me and considered in my medical decision making (see chart for details).    MDM Rules/Calculators/A&P                           Elderly female presenting with months of abdominal pain, gradual weight loss and some anorexia.  Also complains of constipation.  On exam patient has multiple well-healed surgical scars but is tender diffusely.  Her labs are reassuring with normal CBC, CMP, lipase and UA.  CAT scan does show a small posterior right diaphragmatic hernia but no other acute intra-abdominal process.  Patient may have chronic abdominal pain related to her multiple surgeries.  She does currently have a gastroenterologist and has been getting work-up.  At this time feel that patient  should continue to follow-up with her GI provider for further answers.  Suspicion the patient's symptoms are cardiac in nature.  Possibility for chronic mesenteric ischemia however it has been present the worst at night and is just recently started hurting with eating which is not a classic presentation.  No evidence of mass tumor or kidney stones.  No evidence of  UTI today.  Feel that patient is stable for discharge but will discuss with her starting a PPI as some of her pain is epigastric.  Final Clinical Impression(s) / ED Diagnoses Final diagnoses:  Constipation, unspecified constipation type  Generalized abdominal pain    Rx / DC Orders ED Discharge Orders          Ordered    omeprazole (PRILOSEC) 20 MG capsule  Daily        11/22/20 2235    sucralfate (CARAFATE) 1 g tablet  3 times daily with meals & bedtime        11/22/20 2235             Blanchie Dessert, MD 11/22/20 2238

## 2020-11-22 NOTE — ED Triage Notes (Signed)
Pt c/o abd pain and constipation x 1-2 months-states she had upper GI in Sept for abd distention-NAD-steady gait

## 2020-11-22 NOTE — Discharge Instructions (Signed)
Start doing the MiraLAX every day so that you have soft bowel movements and do not have to strain.  Also you can start taking the medicine for acid in your stomach to see if that helps with the pain with eating.  No evidence of UTI today and your CAT scan was okay.

## 2021-01-24 ENCOUNTER — Other Ambulatory Visit: Payer: Self-pay

## 2021-01-24 ENCOUNTER — Ambulatory Visit (INDEPENDENT_AMBULATORY_CARE_PROVIDER_SITE_OTHER): Payer: Medicare HMO | Admitting: Cardiovascular Disease

## 2021-01-24 ENCOUNTER — Encounter: Payer: Self-pay | Admitting: Cardiovascular Disease

## 2021-01-24 VITALS — BP 116/68 | HR 64 | Ht 59.0 in | Wt 103.6 lb

## 2021-01-24 DIAGNOSIS — I2510 Atherosclerotic heart disease of native coronary artery without angina pectoris: Secondary | ICD-10-CM | POA: Diagnosis not present

## 2021-01-24 DIAGNOSIS — I5032 Chronic diastolic (congestive) heart failure: Secondary | ICD-10-CM

## 2021-01-24 DIAGNOSIS — R011 Cardiac murmur, unspecified: Secondary | ICD-10-CM | POA: Diagnosis not present

## 2021-01-24 DIAGNOSIS — R0602 Shortness of breath: Secondary | ICD-10-CM

## 2021-01-24 DIAGNOSIS — E7800 Pure hypercholesterolemia, unspecified: Secondary | ICD-10-CM

## 2021-01-24 DIAGNOSIS — I10 Essential (primary) hypertension: Secondary | ICD-10-CM

## 2021-01-24 NOTE — Patient Instructions (Signed)
Medication Instructions:  No changes *If you need a refill on your cardiac medications before your next appointment, please call your pharmacy*   Lab Work: None ordered If you have labs (blood work) drawn today and your tests are completely normal, you will receive your results only by: [place]  (if you have MyChart) OR A paper copy in the mail If you have any lab test that is abnormal or we need to change your treatment, we will call you to review the results.   Testing/Procedures: Your physician has requested that you have an echocardiogram. Echocardiography is a painless test that uses sound waves to create images of your heart. It provides your doctor with information about the size and shape of your heart and how well your heart's chambers and valves are working. You may receive an ultrasound enhancing agent through an IV if needed to better visualize your heart during the echo.This procedure takes approximately one hour. There are no restrictions for this procedure. This will take place at the [address].    Follow-Up: At [facility], you and your health needs are our priority.  As part of our continuing mission to provide you with exceptional heart care, we have created designated Provider Care Teams.  These Care Teams include your primary Cardiologist (physician) and Advanced Practice Providers (APPs -  Physician Assistants and Nurse Practitioners) who all work together to provide you with the care you need, when you need it.  We recommend signing up for the patient portal called "MyChart".  Sign up information is provided on this After Visit Summary.  MyChart is used to connect with patients for Virtual Visits (Telemedicine).  Patients are able to view lab/test results, encounter notes, upcoming appointments, etc.  Non-urgent messages can be sent to your provider as well.   To learn more about what you can do with MyChart, go to [email or web address].     Your next appointment:   12 month(s)  The format for your next appointment:   In Person  Provider:   Sanda Klein, MD

## 2021-01-24 NOTE — Progress Notes (Signed)
Cardiology Office Note    Date:  01/25/2021   ID:  Crysten, Kaman Mar 16, 1932, MRN [id number]  PCP:  [facility]  Cardiologist:  Dr. Sallyanne Kuster   Chief Complaint  Patient presents with   Shortness of Breath    History of Present Illness:  Theresa Crosby is a 87 y.o. female with PMH of CAD s/p CABG 2013 (LIMA-LAD, SVG-OM2, SVG-Diagonal, SVG-RCA), HTN, and HLD.  Last Myoview obtained on 02/06/2015 showed EF 87%, small defect of mild severity present in the apex location, overall low risk study.  Previous heart monitor obtained on 08/26/2016 showed normal sinus rhythm, very rare PACs, rare relatively little increase in the heart rate during the day concerning for beta-blocker induced chronotropic incompetence.  She had symptoms of heart flutter that occurred in association with normal sinus rhythm.  Echocardiogram in April 2019 showed grade 2 DD, EF 60 to 65%, peak PA pressure 34 mmHg.  She had a normal nuclear stress test in August 2020.  She had normal ABI in 2019.  She does not think she is doing well from a heart point of view.  She complains of shortness of breath with activity such as taking a shower.  However when she "gets started" her symptoms slowly improved.  She complains of hip and knee pain bilaterally.  She complains of numbness in her fingers and toes and states that she "cannot feel her feet".  She does not describe intermittent claudication.  She has occasional chest tightness that does not appear to have a exertional pattern.  She had an ophthalmology exam and the ophthalmologist was concerned about temporal arteritis, but CRP and ESR were normal.  Her most recent lipid profile showed an LDL cholesterol that was close to target at 78 in November 2021, but also an outstanding HDL cholesterol of 79.  She does not have diabetes mellitus and does not smoke.  Past Medical History:  Diagnosis Date   Abnormal nuclear stress test 10/05/2011   Anxiety    2005-  related to care of aging parent    Arthritis    degeneration - spondylosis, OA - all over body,    Blood transfusion    postop- back surg. 1987   Bunion    CAD (coronary artery disease)significant diseas w/ 99% LAD, 80% D1, 80% LCX 10/05/2011   GERD (gastroesophageal reflux disease)    Hyperlipemia    Hypertension    ireg. heartbeat, no referral made to cardiologist , sees [facility].    Neuromuscular disorder (HCC)    burning- L leg , numbing of toes    S/P CABG x 4, LIMA-LAD, SVG-OM2, SVG-diag, SVG-RCA 10/05/2011   Shortness of breath     Past Surgical History:  Procedure Laterality Date   ABDOMINAL HYSTERECTOMY     partial hysterectomy- 1972   ANTERIOR APPROACH HEMI HIP ARTHROPLASTY Left 06/26/2016   Procedure: ANTERIOR APPROACH TOTAL HIP ARTHROPLASTY;  Surgeon: Leandrew Koyanagi, MD;  Location: [facility];  Service: Orthopedics;  Laterality: Left;   BACK SURGERY     1987-    BREAST CYST ASPIRATION Left 06/18/2010   CARDIAC CATHETERIZATION  10/12/2011   high-grade ca+ prox. LAD diagonal branch,higly diseased diagonal branch & obtuse marginal branch   CORONARY ARTERY BYPASS GRAFT  10/04/2011   Procedure: CORONARY ARTERY BYPASS GRAFTING (CABG);  Surgeon: Gaye Pollack, MD;  Location: [facility];  Service: Open Heart Surgery;  Laterality: N/A;  CABG x four using left internal mammary artery and right  leg greater saphenous vein    EYE SURGERY     cataracts removed- bilateral, no IOL   FOOT SURGERY Right January 2013   HT 5   LEFT HEART CATHETERIZATION WITH CORONARY ANGIOGRAM N/A 10/02/2011   Procedure: LEFT HEART CATHETERIZATION WITH CORONARY ANGIOGRAM;  Surgeon: Lorretta Harp, MD;  Location: [facility] CATH LAB;  Service: Cardiovascular;  Laterality: N/A;   LUMBAR LAMINECTOMY/DECOMPRESSION MICRODISCECTOMY  03/04/2011   Procedure: LUMBAR LAMINECTOMY/DECOMPRESSION MICRODISCECTOMY;  Surgeon: Hosie Spangle, MD;  Location: [facility];  Service: Neurosurgery;  Laterality: N/A;  Thoracic Ten-Thoracic  Twelve Thoracic Laminectomy   NM MYOVIEW LTD  09/15/2011   mild ischemia mid anterior & apical anterior region.EF: 86%   TOE SURGERY     R foot- removed calus between 5-4   TUBAL LIGATION      Current Medications: Outpatient Medications Prior to Visit  Medication Sig Dispense Refill   amLODipine (NORVASC) 10 MG tablet Take 10 tablets by mouth daily.     aspirin EC 81 MG tablet Take 1 tablet (81 mg total) by mouth daily. 100 tablet 0   Calcium-Vitamin D-Vitamin K (CALCIUM SOFT CHEWS PO) Take by mouth daily.     Cholecalciferol (VITAMIN D3) 1000 UNITS CAPS Take 2,000 Units by mouth every morning.       clobetasol ointment (TEMOVATE) 0.05 % Patient applies 3 times weekly     dorzolamide-timolol (COSOPT) 22.3-6.8 MG/ML ophthalmic solution Place 1 drop into both eyes 2 (two) times daily.  5   magnesium oxide (MAG-OX) 400 MG tablet Take 400 mg by mouth daily.     metoprolol tartrate (LOPRESSOR) 25 MG tablet Take 1 tablet (25 mg total) by mouth 2 (two) times daily. 90 tablet 3   omeprazole (PRILOSEC) 20 MG capsule Take 1 capsule (20 mg total) by mouth daily. 14 capsule 0   rosuvastatin (CRESTOR) 20 MG tablet Take 1 tablet (20 mg total) by mouth daily. 90 tablet 3   acetaminophen (TYLENOL) 325 MG tablet Take 1 or 2 tablets every 6 hours as needed for pain (Patient not taking: Reported on 01/24/2021)     amLODipine (NORVASC) 5 MG tablet Take 2.5 mg by mouth. Pt takes 48m daily (Patient not taking: Reported on 01/24/2021)     loratadine (CLARITIN) 10 MG tablet Take 10 mg by mouth daily as needed for allergies. (Patient not taking: Reported on 01/24/2021)     mupirocin ointment (BACTROBAN) 2 % as needed. (Patient not taking: Reported on 01/24/2021)     sucralfate (CARAFATE) 1 g tablet Take 1 tablet (1 g total) by mouth 4 (four) times daily -  with meals and at bedtime for 14 days. (Patient not taking: Reported on 01/24/2021) 56 tablet 0   No facility-administered medications prior to visit.      Allergies:   Aspirin and Lactose intolerance (gi)   Social History   Socioeconomic History   Marital status: Widowed    Spouse name: Not on file   Number of children: Not on file   Years of education: Not on file   Highest education level: Not on file  Occupational History   Occupation: Was working at [employer]   Smoking status: Never   Smokeless tobacco: Never  Vaping Use   Vaping Use: Never used  Substance and Sexual Activity   Alcohol use: No    Alcohol/week: 0.0 standard drinks   Drug use: No   Sexual activity: Not on file  Other Topics Concern  Not on file  Social History Narrative   Lives alone in an apartment.    Social Determinants of Health   Financial Resource Strain: Not on file  Food Insecurity: Not on file  Transportation Needs: Not on file  Physical Activity: Not on file  Stress: Not on file  Social Connections: Not on file     Family History:  The patient's family history includes Heart attack in her brother, father, and mother; Hypertension in her father.   ROS:   Please see the history of present illness.    ROS All other systems are reviewed and are negative.   PHYSICAL EXAM:   VS:  BP 116/68 (BP Location: Left Arm, Patient Position: Sitting, Cuff Size: Normal)   Pulse 64   Ht _0  (1.499 m)   Wt 103 lb 9.6 oz (47 kg)   SpO2 100%   BMI 20.92 kg/m      General: Alert, oriented x3, no distress, very lean, borderline underweight Head: no evidence of trauma, PERRL, EOMI, no exophtalmos or lid lag, no myxedema, no xanthelasma; normal ears, nose and oropharynx Neck: normal jugular venous pulsations and no hepatojugular reflux; brisk carotid pulses without delay and no carotid bruits Chest: clear to auscultation, no signs of consolidation by percussion or palpation, normal fremitus, symmetrical and full respiratory excursions Cardiovascular: normal position and quality of the apical impulse, regular rhythm, normal  first and second heart sounds, 1/6 aortic ejection murmur, no diastolic murmurs, rubs or gallops.  Some keloid transformation of the lower part of her sternotomy scar. Abdomen: no tenderness or distention, no masses by palpation, no abnormal pulsatility or arterial bruits, normal bowel sounds, no hepatosplenomegaly Extremities: no clubbing, cyanosis or edema; 2+ radial, ulnar and brachial pulses bilaterally; 2+ right femoral, posterior tibial and dorsalis pedis pulses; 2+ left femoral, posterior tibial and dorsalis pedis pulses; no subclavian or femoral bruits Neurological: grossly nonfocal Psych: Normal mood and affect    Wt Readings from Last 3 Encounters:  01/24/21 103 lb 9.6 oz (47 kg)  11/22/20 102 lb (46.3 kg)  12/28/19 108 lb 6.4 oz (49.2 kg)      Studies/Labs Reviewed:   EKG:  EKG is ordered today.  It shows normal sinus rhythm and is a completely normal tracing. Recent Labs: 09/04/2020: TSH 1.400 11/22/2020: ALT 19; BUN 10; Creatinine, Ser 0.61; Hemoglobin 13.8; Platelets 242; Potassium 3.6; Sodium 135   Lipid Panel    Component Value Date/Time   CHOL 168 12/28/2019 1016   TRIG 57 12/28/2019 1016   HDL 79 12/28/2019 1016   CHOLHDL 2.1 12/28/2019 1016   CHOLHDL 1.9 10/30/2015 1405   VLDL 12 10/30/2015 1405   LDLCALC 78 12/28/2019 1016    Additional studies/ records that were reviewed today include:    Myoview 02/06/2015 Study Highlights  Nuclear stress EF: 87%. There was no ST segment deviation noted during stress. Defect 1: There is a small defect of mild severity present in the apex location. This is a low risk study.   Low risk stress nuclear study with a small and fixed apical abnormality. Prior to CABG, the nuclear study showed a reversible defect in the same territory. Wall motion is normal in this area: may represent scar or attenuation artifact. Normal left ventricular regional and global systolic function.   Echocardiogram 05/27/2017 - Left ventricle:  The cavity size was normal. Systolic function was    normal. The estimated ejection fraction was in the range of 60%    to 65%.  Wall motion was normal; there were no regional wall    motion abnormalities. Features are consistent with a pseudonormal    left ventricular filling pattern, with concomitant abnormal    relaxation and increased filling pressure (grade 2 diastolic    dysfunction). Doppler parameters are consistent with high    ventricular filling pressure.  - Aortic valve: There was no regurgitation.  - Mitral valve: Transvalvular velocity was within the normal range.    There was no evidence for stenosis. There was trivial    regurgitation.  - Right ventricle: The cavity size was normal. Wall thickness was    normal. Systolic function was normal.  - Atrial septum: No defect or patent foramen ovale was identified.  - Tricuspid valve: There was mild regurgitation.  - Pulmonary arteries: Systolic pressure was within the normal    range. PA peak pressure: 34 mm Hg (S).    ASSESSMENT:    1. Shortness of breath   2. Chronic diastolic heart failure ([place])   3. Coronary artery disease involving native coronary artery of native heart without angina pectoris   4. Murmur   5. Essential hypertension   6. Hypercholesterolemia      PLAN:  In order of problems listed above:  Exertional dyspnea: Normal systolic function on echo from 2019, but consider diastolic heart failure.  Check an echocardiogram: her previous study did show findings consistent with "pseudonormal" mitral inflow pattern, but she did not have shortness of breath at the time.  Consider angina equivalent since its been about 10 years since her bypass surgery.  Also check a [employer].   CAD s/p CABG: On aspirin, statin and beta-blocker.  Is been almost 10 years since her bypass procedure and graft failure is possible.  We will check a nuclear perfusion study. Cardiac murmur: Faint and suggestive of aortic valve  sclerosis. Hypertension: Well-controlled on current medications.  No changes recommended. Hyperlipidemia: Although the LDL cholesterol is just a little bit shy of target (less than 70), her HDL cholesterol is outstanding.  No changes were made to her medications today. Neuropathy: Etiology uncertain.  She does not have diabetes and does not drink alcohol.  No history of chemotherapy or other toxins commonly associated with neuropathy.    Medication Adjustments/Labs and Tests Ordered: Current medicines are reviewed at length with the patient today.  Concerns regarding medicines are outlined above.  Medication changes, Labs and Tests ordered today are listed in the Patient Instructions below. Patient Instructions  Medication Instructions:  No changes *If you need a refill on your cardiac medications before your next appointment, please call your pharmacy*   Lab Work: None ordered If you have labs (blood work) drawn today and your tests are completely normal, you will receive your results only by: [place] (if you have MyChart) OR A paper copy in the mail If you have any lab test that is abnormal or we need to change your treatment, we will call you to review the results.   Testing/Procedures: Your physician has requested that you have an echocardiogram. Echocardiography is a painless test that uses sound waves to create images of your heart. It provides your doctor with information about the size and shape of your heart and how well your heart's chambers and valves are working. You may receive an ultrasound enhancing agent through an IV if needed to better visualize your heart during the echo.This procedure takes approximately one hour. There are no restrictions for this procedure. This will take place  at the [address].    Follow-Up: At [facility], you and your health needs are our priority.  As part of our continuing mission to provide you with exceptional  heart care, we have created designated Provider Care Teams.  These Care Teams include your primary Cardiologist (physician) and Advanced Practice Providers (APPs -  Physician Assistants and Nurse Practitioners) who all work together to provide you with the care you need, when you need it.  We recommend signing up for the patient portal called "MyChart".  Sign up information is provided on this After Visit Summary.  MyChart is used to connect with patients for Virtual Visits (Telemedicine).  Patients are able to view lab/test results, encounter notes, upcoming appointments, etc.  Non-urgent messages can be sent to your provider as well.   To learn more about what you can do with MyChart, go to [email or web address].    Your next appointment:   12 month(s)  The format for your next appointment:   In Person  Provider:   Sanda Klein, MD       Signed, Sanda Klein, MD  01/25/2021 12:44 PM    [address], [place], [place]  [address] Phone: [phone]; Fax: [phone]

## 2021-01-25 ENCOUNTER — Encounter: Payer: Self-pay | Admitting: Cardiovascular Disease

## 2021-02-20 ENCOUNTER — Other Ambulatory Visit: Payer: Self-pay

## 2021-02-20 ENCOUNTER — Ambulatory Visit (HOSPITAL_COMMUNITY): Payer: Medicare HMO | Attending: Internal Medicine

## 2021-02-20 DIAGNOSIS — R0602 Shortness of breath: Secondary | ICD-10-CM | POA: Diagnosis not present

## 2021-02-20 LAB — ECHOCARDIOGRAM COMPLETE
Area-P 1/2: 3.03 cm2
S' Lateral: 2.2 cm

## 2021-09-17 ENCOUNTER — Encounter: Payer: Self-pay | Admitting: Cardiovascular Disease

## 2021-09-17 ENCOUNTER — Ambulatory Visit (INDEPENDENT_AMBULATORY_CARE_PROVIDER_SITE_OTHER): Payer: Medicare HMO | Admitting: Cardiovascular Disease

## 2021-09-17 VITALS — BP 136/72 | HR 71 | Ht 59.0 in | Wt 105.2 lb

## 2021-09-17 DIAGNOSIS — I5032 Chronic diastolic (congestive) heart failure: Secondary | ICD-10-CM | POA: Diagnosis not present

## 2021-09-17 DIAGNOSIS — E7800 Pure hypercholesterolemia, unspecified: Secondary | ICD-10-CM

## 2021-09-17 DIAGNOSIS — I2510 Atherosclerotic heart disease of native coronary artery without angina pectoris: Secondary | ICD-10-CM | POA: Diagnosis not present

## 2021-09-17 DIAGNOSIS — I10 Essential (primary) hypertension: Secondary | ICD-10-CM

## 2021-09-17 DIAGNOSIS — R0602 Shortness of breath: Secondary | ICD-10-CM | POA: Diagnosis not present

## 2021-09-17 NOTE — Progress Notes (Signed)
Cardiology Office Note    Date:  09/18/2021   ID:  Theresa Crosby, N[place] July 23, 1932, MRN [id number]  PCP:  [facility]  Cardiologist:  Dr. Sallyanne Kuster   Chief Complaint  Patient presents with   Shortness of Breath    History of Present Illness:  Theresa Crosby is a 88 y.o. female with PMH of CAD s/p CABG 2013 (LIMA-LAD, SVG-OM2, SVG-Diagonal, SVG-RCA), HTN, and HLD.    Only.  She reports that she gets short of breath with minimal activity, sometimes just getting dressed.  She does not have angina either at rest or when walking, but never had angina even before her diagnosis of CAD that led to bypass surgery.  She presented with dyspnea at that time as well.  She has not had any problems with orthopnea or PND and denies palpitations, dizziness or syncope.  She has chronic pain in multiple joints.  She underwent arthrocentesis for swollen left knee about 3 to 4 weeks ago.  There is a plan for her to possibly have a steroid spinal injection in the near future.  She continues to have complaints of neuropathic pain in her fingers and toes.  She has chronic pain in her left hip after surgery.  She believes that her dyspnea is now limiting her activity more than her hip, knee and back pain.  ECG shows normal sinus rhythm without any ischemic repolarization abnormalities.  Her echocardiogram earlier this year 02/20/2021 shows normal left ventricular systolic function, at most mild diastolic dysfunction (parameters medically normal for age) and no significant valvular problems or pericardial disease.  Lower extremity ABI/duplex ultrasound showed no evidence of significant PAD.  Most recent nuclear stress test in August 2020 showed a small and mild fixed apical perfusion defect felt to be artifact, no ischemia, EF 77% (low risk). Previous heart monitor obtained on 08/26/2016 showed normal sinus rhythm, very rare PACs, rare relatively little increase in the heart rate during the day concerning  for beta-blocker induced chronotropic incompetence.  She had symptoms of heart flutter that occurred in association with normal sinus rhythm.    Her most recent lipid profile showed an LDL cholesterol that was close to target at 78 in November 2021, but also an outstanding HDL cholesterol of 79.  She does not have diabetes mellitus and does not smoke.  She believes she has had a repeat lipid profile performed recently at [facility], but those results are not currently available for review.  Past Medical History:  Diagnosis Date   Abnormal nuclear stress test 10/05/2011   Anxiety    2005- related to care of aging parent    Arthritis    degeneration - spondylosis, OA - all over body,    Blood transfusion    postop- back surg. 1987   Bunion    CAD (coronary artery disease)significant diseas w/ 99% LAD, 80% D1, 80% LCX 10/05/2011   GERD (gastroesophageal reflux disease)    Hyperlipemia    Hypertension    ireg. heartbeat, no referral made to cardiologist , sees [facility].    Neuromuscular disorder (HCC)    burning- L leg , numbing of toes    S/P CABG x 4, LIMA-LAD, SVG-OM2, SVG-diag, SVG-RCA 10/05/2011   Shortness of breath     Past Surgical History:  Procedure Laterality Date   ABDOMINAL HYSTERECTOMY     partial hysterectomy- 1972   ANTERIOR APPROACH HEMI HIP ARTHROPLASTY Left 06/26/2016   Procedure: ANTERIOR APPROACH TOTAL HIP ARTHROPLASTY;  Surgeon:  Leandrew Koyanagi, MD;  Location: [facility];  Service: Orthopedics;  Laterality: Left;   BACK SURGERY     1987-    BREAST CYST ASPIRATION Left 06/18/2010   CARDIAC CATHETERIZATION  10/12/2011   high-grade ca+ prox. LAD diagonal branch,higly diseased diagonal branch & obtuse marginal branch   CORONARY ARTERY BYPASS GRAFT  10/04/2011   Procedure: CORONARY ARTERY BYPASS GRAFTING (CABG);  Surgeon: Gaye Pollack, MD;  Location: [place];  Service: Open Heart Surgery;  Laterality: N/A;  CABG x four using left internal mammary artery and right leg  greater saphenous vein    EYE SURGERY     cataracts removed- bilateral, no IOL   FOOT SURGERY Right January 2013   HT 5   LEFT HEART CATHETERIZATION WITH CORONARY ANGIOGRAM N/A 10/02/2011   Procedure: LEFT HEART CATHETERIZATION WITH CORONARY ANGIOGRAM;  Surgeon: Lorretta Harp, MD;  Location: [facility] CATH LAB;  Service: Cardiovascular;  Laterality: N/A;   LUMBAR LAMINECTOMY/DECOMPRESSION MICRODISCECTOMY  03/04/2011   Procedure: LUMBAR LAMINECTOMY/DECOMPRESSION MICRODISCECTOMY;  Surgeon: Hosie Spangle, MD;  Location: [facility] NEURO ORS;  Service: Neurosurgery;  Laterality: N/A;  Thoracic Ten-Thoracic Twelve Thoracic Laminectomy   NM MYOVIEW LTD  09/15/2011   mild ischemia mid anterior & apical anterior region.EF: 86%   TOE SURGERY     R foot- removed calus between 5-4   TUBAL LIGATION      Current Medications: Outpatient Medications Prior to Visit  Medication Sig Dispense Refill   amLODipine (NORVASC) 10 MG tablet Take 10 tablets by mouth daily.     aspirin EC 81 MG tablet Take 1 tablet (81 mg total) by mouth daily. 100 tablet 0   Calcium-Vitamin D-Vitamin K (CALCIUM SOFT CHEWS PO) Take by mouth daily.     Cholecalciferol (VITAMIN D3) 1000 UNITS CAPS Take 2,000 Units by mouth every morning.       clobetasol ointment (TEMOVATE) 0.05 % Patient applies 3 times weekly     loratadine (CLARITIN) 10 MG tablet Take 10 mg by mouth daily as needed for allergies.     magnesium oxide (MAG-OX) 400 MG tablet Take 400 mg by mouth daily.     metoprolol tartrate (LOPRESSOR) 25 MG tablet Take 1 tablet (25 mg total) by mouth 2 (two) times daily. 90 tablet 3   omeprazole (PRILOSEC) 20 MG capsule Take 1 capsule (20 mg total) by mouth daily. 14 capsule 0   rosuvastatin (CRESTOR) 10 MG tablet Take 10 mg by mouth at bedtime.     timolol (TIMOPTIC) 0.5 % ophthalmic solution Place 1 drop into both eyes 2 (two) times daily.     Travoprost, BAK Free, (TRAVATAN) 0.004 % SOLN ophthalmic solution Place 1 drop into both eyes  at bedtime.     acetaminophen (TYLENOL) 325 MG tablet  (Patient not taking: Reported on 09/17/2021)     dorzolamide-timolol (COSOPT) 22.3-6.8 MG/ML ophthalmic solution Place 1 drop into both eyes 2 (two) times daily. (Patient not taking: Reported on 09/17/2021)  5   rosuvastatin (CRESTOR) 20 MG tablet Take 1 tablet (20 mg total) by mouth daily. (Patient not taking: Reported on 09/17/2021) 90 tablet 3   No facility-administered medications prior to visit.     Allergies:   Aspirin and Lactose intolerance (gi)   Social History   Socioeconomic History   Marital status: Widowed    Spouse name: Not on file   Number of children: Not on file   Years of education: Not on file   Highest education level: Not on  file  Occupational History   Occupation: Was working at [employer] Use   Smoking status: Never   Smokeless tobacco: Never  Vaping Use   Vaping Use: Never used  Substance and Sexual Activity   Alcohol use: No    Alcohol/week: 0.0 standard drinks of alcohol   Drug use: No   Sexual activity: Not on file  Other Topics Concern   Not on file  Social History Narrative   Lives alone in an apartment.    Social Determinants of Health   Financial Resource Strain: Not on file  Food Insecurity: Not on file  Transportation Needs: Not on file  Physical Activity: Not on file  Stress: Not on file  Social Connections: Not on file     Family History:  The patient's family history includes Heart attack in her brother, father, and mother; Hypertension in her father.   ROS:   Please see the history of present illness.    ROS All other systems are reviewed and are negative.   PHYSICAL EXAM:   VS:  BP 136/72 (BP Location: Left Arm, Patient Position: Sitting, Cuff Size: Small)   Pulse 71   Ht '4\' 11"'$  (1.499 m)   Wt 105 lb 3.2 oz (47.7 kg)   SpO2 97%   BMI 21.25 kg/m      General: Alert, oriented x3, no distress, very lean Head: no evidence of trauma, PERRL, EOMI, no  exophtalmos or lid lag, no myxedema, no xanthelasma; normal ears, nose and oropharynx Neck: normal jugular venous pulsations and no hepatojugular reflux; brisk carotid pulses without delay and no carotid bruits Chest: Keloid sternotomy scar, clear to auscultation, no signs of consolidation by percussion or palpation, normal fremitus, symmetrical and full respiratory excursions Cardiovascular: normal position and quality of the apical impulse, regular rhythm, normal first and second heart sounds, no murmurs, rubs or gallops Abdomen: no tenderness or distention, no masses by palpation, no abnormal pulsatility or arterial bruits, normal bowel sounds, no hepatosplenomegaly Extremities: no clubbing, cyanosis or edema; 2+ radial, ulnar and brachial pulses bilaterally; 2+ right femoral, posterior tibial and dorsalis pedis pulses; 2+ left femoral, posterior tibial and dorsalis pedis pulses; no subclavian or femoral bruits Neurological: grossly nonfocal Psych: Normal mood and affect     Wt Readings from Last 3 Encounters:  09/17/21 105 lb 3.2 oz (47.7 kg)  01/24/21 103 lb 9.6 oz (47 kg)  11/22/20 102 lb (46.3 kg)      Studies/Labs Reviewed:   Echocardiogram 02/20/2021   Left ventricular ejection fraction, by estimation, is 60 to 65%. The left ventricle has normal function. The left ventricle has no regional wall motion abnormalities. Left ventricular diastolic parameters are consistent with Grade I diastolic dysfunction (impaired relaxation). Right ventricular systolic function is mildly reduced. The right ventricular size is normal. There is normal pulmonary artery systolic pressure. The estimated right ventricular systolic pressure is 40.0 mmHg. Left atrial size was mildly dilated. The mitral valve is abnormal. Trivial mitral valve regurgitation. The aortic valve is tricuspid. Aortic valve regurgitation is not visualized. Aortic valve sclerosis/calcification is present, without any evidence of  aortic stenosis. The inferior vena cava is normal in size with greater than 50% respiratory variability, suggesting right atrial pressure of 3 mmHg.  Comparison(s): No significant change from prior study. 05/27/17 EF 60-65%. PA pressure 39mHg.  Nuclear stress test 09/27/2018:  The left ventricular ejection fraction is hyperdynamic (>65%). Nuclear stress EF: 77%. There was no ST segment deviation noted during stress.  This is a low risk study.   1. EF 77%, normal wall motion.  2. Fixed small, mild apical perfusion defect.  Given normal wall motion, this seems most likely to be soft tissue attenuation.  No ischemia.    Low risk study.     EKG:  EKG is ordered today.  It shows normal sinus rhythm, first-degree AV block (PR 222 ms, questionable left atrial abnormality, no ischemic repolarization changes.  QTc 430 ms   Recent Labs: 11/22/2020: ALT 19 09/17/2021: BNP 150.5; BUN 9; Creatinine, Ser 0.60; Hemoglobin 14.2; Platelets 257; Potassium 4.8; Sodium 139   Lipid Panel    Component Value Date/Time   CHOL 177 09/17/2021 1240   TRIG 59 09/17/2021 1240   HDL 88 09/17/2021 1240   CHOLHDL 2.0 09/17/2021 1240   CHOLHDL 1.9 10/30/2015 1405   VLDL 12 10/30/2015 1405   LDLCALC 78 09/17/2021 1240    Additional studies/ records that were reviewed today include:      ASSESSMENT:    1. Shortness of breath   2. Chronic diastolic heart failure ([place])   3. Coronary artery disease involving native coronary artery of native heart without angina pectoris   4. Essential hypertension   5. Hypercholesterolemia      PLAN:  In order of problems listed above:  Exertional dyspnea/possible CHF: Recent echo shows normal left ventricular systolic function and although there are some mild diastolic function abnormalities there was no evidence of elevated filling pressures.  It is possible that her dyspnea is an angina equivalent since its been over 10 years since her bypass surgery.  Check a  Lexiscan Myoview.  She is now 88 years old.  Invasive procedure should not be undertaken likely without more objective evidence of a cardiac problem. CAD s/p CABG: On aspirin, statin and beta-blocker.  At more than 10 years since bypass surgery, graft failure is a possibility.  She has never had angina pectoris. Hypertension: Adequate control on current medications. Hyperlipidemia: Labs usually followed at [facility]  Most recent LDL cholesterol from November 2021 was a little high at 43.  Get more recent labs.  Consider Zetia. Neuropathy: Etiology uncertain.  She does not have diabetes and does not drink alcohol.  No history of chemotherapy or other toxins commonly associated with neuropathy.    Medication Adjustments/Labs and Tests Ordered: Current medicines are reviewed at length with the patient today.  Concerns regarding medicines are outlined above.  Medication changes, Labs and Tests ordered today are listed in the Patient Instructions below. Patient Instructions  Medication Instructions:  No changes *If you need a refill on your cardiac medications before your next appointment, please call your pharmacy*   Lab Work: Your provider would like for you to have the following labs today: CBC, BMET, Lipid and BNP  If you have labs (blood work) drawn today and your tests are completely normal, you will receive your results only by: [place] (if you have MyChart) OR A paper copy in the mail If you have any lab test that is abnormal or we need to change your treatment, we will call you to review the results.   Testing/Procedures: Your physician has requested that you have a lexiscan myoview. For further information please visit [email or web address]. Please follow instruction sheet, as given. This will take place at [address]  How to prepare for your Myocardial Perfusion Test: Do not eat or drink 3 hours prior to your test, except you may have water. Do  not  consume products containing caffeine (regular or decaffeinated) 12 hours prior to your test. (ex: coffee, chocolate, sodas, tea). Do bring a list of your current medications with you.  If not listed below, you may take your medications as normal. Do wear comfortable clothes (no dresses or overalls) and walking shoes, tennis shoes preferred (No heels or open toe shoes are allowed). Do NOT wear cologne, perfume, aftershave, or lotions (deodorant is allowed). The test will take approximately 3 to 4 hours to complete If these instructions are not followed, your test will have to be rescheduled.    Follow-Up: At [facility], you and your health needs are our priority.  As part of our continuing mission to provide you with exceptional heart care, we have created designated Provider Care Teams.  These Care Teams include your primary Cardiologist (physician) and Advanced Practice Providers (APPs -  Physician Assistants and Nurse Practitioners) who all work together to provide you with the care you need, when you need it.  We recommend signing up for the patient portal called "MyChart".  Sign up information is provided on this After Visit Summary.  MyChart is used to connect with patients for Virtual Visits (Telemedicine).  Patients are able to view lab/test results, encounter notes, upcoming appointments, etc.  Non-urgent messages can be sent to your provider as well.   To learn more about what you can do with MyChart, go to [email or web address].    Your next appointment:   4 month(s)  The format for your next appointment:   In Person  Provider:   Sanda Klein, MD {   I    Signed, Sanda Klein, MD  09/18/2021 3:28 PM    [address], [place], [place]  [address] Phone: [phone]; Fax: [phone]

## 2021-09-17 NOTE — Patient Instructions (Signed)
Medication Instructions:  No changes *If you need a refill on your cardiac medications before your next appointment, please call your pharmacy*   Lab Work: Your provider would like for you to have the following labs today: CBC, BMET, Lipid and BNP  If you have labs (blood work) drawn today and your tests are completely normal, you will receive your results only by: [place] (if you have MyChart) OR A paper copy in the mail If you have any lab test that is abnormal or we need to change your treatment, we will call you to review the results.   Testing/Procedures: Your physician has requested that you have a lexiscan myoview. For further information please visit [email or web address]. Please follow instruction sheet, as given. This will take place at [address], [address]  How to prepare for your Myocardial Perfusion Test: Do not eat or drink 3 hours prior to your test, except you may have water. Do not consume products containing caffeine (regular or decaffeinated) 12 hours prior to your test. (ex: coffee, chocolate, sodas, tea). Do bring a list of your current medications with you.  If not listed below, you may take your medications as normal. Do wear comfortable clothes (no dresses or overalls) and walking shoes, tennis shoes preferred (No heels or open toe shoes are allowed). Do NOT wear cologne, perfume, aftershave, or lotions (deodorant is allowed). The test will take approximately 3 to 4 hours to complete If these instructions are not followed, your test will have to be rescheduled.    Follow-Up: At [facility], you and your health needs are our priority.  As part of our continuing mission to provide you with exceptional heart care, we have created designated Provider Care Teams.  These Care Teams include your primary Cardiologist (physician) and Advanced Practice Providers (APPs -  Physician Assistants and Nurse Practitioners) who all work together to provide you with  the care you need, when you need it.  We recommend signing up for the patient portal called "MyChart".  Sign up information is provided on this After Visit Summary.  MyChart is used to connect with patients for Virtual Visits (Telemedicine).  Patients are able to view lab/test results, encounter notes, upcoming appointments, etc.  Non-urgent messages can be sent to your provider as well.   To learn more about what you can do with MyChart, go to [email or web address].    Your next appointment:   4 month(s)  The format for your next appointment:   In Person  Provider:   Sanda Klein, MD {   I

## 2021-09-18 ENCOUNTER — Telehealth (HOSPITAL_COMMUNITY): Payer: Self-pay | Admitting: Radiology

## 2021-09-18 ENCOUNTER — Encounter: Payer: Self-pay | Admitting: Cardiovascular Disease

## 2021-09-18 LAB — BASIC METABOLIC PANEL
BUN/Creatinine Ratio: 15 (ref 12–28)
BUN: 9 mg/dL (ref 8–27)
CO2: 25 mmol/L (ref 20–29)
Calcium: 10.1 mg/dL (ref 8.7–10.3)
Chloride: 100 mmol/L (ref 96–106)
Creatinine, Ser: 0.6 mg/dL (ref 0.57–1.00)
Glucose: 90 mg/dL (ref 70–99)
Potassium: 4.8 mmol/L (ref 3.5–5.2)
Sodium: 139 mmol/L (ref 134–144)
eGFR: 87 mL/min/{1.73_m2} (ref >59)

## 2021-09-18 LAB — LIPID PANEL
Chol/HDL Ratio: 2 ratio (ref 0.0–4.4)
Cholesterol, Total: 177 mg/dL (ref 100–199)
HDL: 88 mg/dL (ref >39)
LDL Chol Calc (NIH): 78 mg/dL (ref 0–99)
Triglycerides: 59 mg/dL (ref 0–149)
VLDL Cholesterol Cal: 11 mg/dL (ref 5–40)

## 2021-09-18 LAB — CBC
Hematocrit: 44 % (ref 34.0–46.6)
Hemoglobin: 14.2 g/dL (ref 11.1–15.9)
MCH: 29.9 pg (ref 26.6–33.0)
MCHC: 32.3 g/dL (ref 31.5–35.7)
MCV: 93 fL (ref 79–97)
Platelets: 257 10*3/uL (ref 150–450)
RBC: 4.75 x10E6/uL (ref 3.77–5.28)
RDW: 13.3 % (ref 11.7–15.4)
WBC: 4.5 10*3/uL (ref 3.4–10.8)

## 2021-09-18 LAB — BRAIN NATRIURETIC PEPTIDE: BNP: 150.5 pg/mL — ABNORMAL HIGH (ref 0.0–100.0)

## 2021-09-18 NOTE — Telephone Encounter (Signed)
Left message on voicemail per DPR in reference to upcoming appointment scheduled on 8/7 at 10:45 with detailed instructions given per Myocardial Perfusion Study Information Sheet for the test. LM to arrive 15 minutes early, and that it is imperative to arrive on time for appointment to keep from having the test rescheduled. If you need to cancel or reschedule your appointment, please call the office within 24 hours of your appointment. Failure to do so may result in a cancellation of your appointment, and a $50 no show fee. Phone number given for call back for any questions.

## 2021-09-22 ENCOUNTER — Ambulatory Visit (HOSPITAL_COMMUNITY): Payer: Medicare HMO | Attending: Cardiovascular Disease

## 2021-09-22 DIAGNOSIS — R0602 Shortness of breath: Secondary | ICD-10-CM | POA: Diagnosis present

## 2021-09-22 LAB — MYOCARDIAL PERFUSION IMAGING
LV dias vol: 38 mL (ref 46–106)
LV sys vol: 8 mL
Nuc Stress EF: 78 %
Peak HR: 110 {beats}/min
Rest HR: 70 {beats}/min
Rest Nuclear Isotope Dose: 10 mCi
SDS: 1
SRS: 0
SSS: 1
ST Depression (mm): 0 mm
Stress Nuclear Isotope Dose: 30.1 mCi
TID: 1.1

## 2021-09-22 MED ORDER — REGADENOSON 0.4 MG/5ML IV SOLN
0.4000 mg | Freq: Once | INTRAVENOUS | Status: AC
  Administered 2021-09-22: 0.4 mg via INTRAVENOUS

## 2021-09-22 MED ORDER — TECHNETIUM TC 99M TETROFOSMIN IV KIT
10.0000 | PACK | Freq: Once | INTRAVENOUS | Status: AC | PRN
  Administered 2021-09-22: 10 via INTRAVENOUS

## 2021-09-22 MED ORDER — TECHNETIUM TC 99M TETROFOSMIN IV KIT
30.1000 | PACK | Freq: Once | INTRAVENOUS | Status: AC | PRN
  Administered 2021-09-22: 30.1 via INTRAVENOUS

## 2022-01-15 NOTE — Progress Notes (Signed)
Cardiology Office Note:    Date:  01/28/2022   ID:  Theresa Crosby, DOB 1932-12-12, MRN [id number]  PCP:  [facility] Cardiologist:  Sanda Klein, MD     Referring MD: [facility]   Chief Complaint:  Follow-up     History of Present Illness:   Theresa Crosby is a 88 y.o. female with  PMH of CAD s/p CABG 2013 (LIMA-LAD, SVG-OM2, SVG-Diagonal, SVG-RCA), HTN, and HLD.        Echocardiogram 02/20/2021 shows normal left ventricular systolic function, at most mild diastolic dysfunction (parameters medically normal for age) and no significant valvular problems or pericardial disease.  Lower extremity ABI/duplex ultrasound showed no evidence of significant PAD.  Most recent nuclear stress test in August 2020 showed a small and mild fixed apical perfusion defect felt to be artifact, no ischemia, EF 77% (low risk). Previous heart monitor obtained on 08/26/2016 showed normal sinus rhythm, very rare PACs, rare relatively little increase in the heart rate during the day concerning for beta-blocker induced chronotropic incompetence.  She had symptoms of heart flutter that occurred in association with normal sinus rhythm.    She last saw Dr. Recardo Evangelist 09/2021 with SOB with minimal activity. Lexiscan 09/2021 was normal.  Patient comes in for f/u. PCP ordered PT and it's helped. She stopped driving and is having visual problems and doesn't think glaucoma is being taken care of properly.  Denies chest pain, dyspnea, palpitations, edema. Plans on moving back to [place] to be closer to her children. On a wait list for independent living. Just had blood work by PCP.      Past Medical History:  Diagnosis Date   Abnormal nuclear stress test 10/05/2011   Anxiety    2005- related to care of aging parent    Arthritis    degeneration - spondylosis, OA - all over body,    Blood transfusion    postop- back surg. 1987   Bunion    CAD (coronary artery  disease)significant diseas w/ 99% LAD, 80% D1, 80% LCX 10/05/2011   GERD (gastroesophageal reflux disease)    Hyperlipemia    Hypertension    ireg. heartbeat, no referral made to cardiologist , sees [facility].    Neuromuscular disorder (HCC)    burning- L leg , numbing of toes    S/P CABG x 4, LIMA-LAD, SVG-OM2, SVG-diag, SVG-RCA 10/05/2011   Shortness of breath    Current Medications: Current Meds  Medication Sig   acetaminophen (TYLENOL) 325 MG tablet    amLODipine (NORVASC) 10 MG tablet Take 10 tablets by mouth daily.   aspirin EC 81 MG tablet Take 1 tablet (81 mg total) by mouth daily.   Calcium-Vitamin D-Vitamin K (CALCIUM SOFT CHEWS PO) Take by mouth daily.   Cholecalciferol (VITAMIN D3) 1000 UNITS CAPS Take 2,000 Units by mouth every morning.     clobetasol ointment (TEMOVATE) 0.05 % Patient applies 3 times weekly   diclofenac Sodium (VOLTAREN) 1 % GEL Apply 1 Application topically 2 (two) times daily as needed.   loratadine (CLARITIN) 10 MG tablet Take 10 mg by mouth daily as needed for allergies.   magnesium oxide (MAG-OX) 400 MG tablet Take 400 mg by mouth daily.   metoprolol tartrate (LOPRESSOR) 25 MG tablet Take 1 tablet (25 mg total) by mouth 2 (two) times daily.   omeprazole (PRILOSEC) 20 MG capsule Take 1 capsule (20 mg total) by mouth daily.   rosuvastatin (CRESTOR) 10 MG  tablet Take 10 mg by mouth at bedtime.   timolol (TIMOPTIC) 0.5 % ophthalmic solution Place 1 drop into both eyes 2 (two) times daily.    Allergies:   Aspirin, Lactose intolerance (gi), and Brimonidine   Social History   Tobacco Use   Smoking status: Never   Smokeless tobacco: Never  Vaping Use   Vaping Use: Never used  Substance Use Topics   Alcohol use: No    Alcohol/week: 0.0 standard drinks of alcohol   Drug use: No    Family Hx: The patient's family history includes Heart attack in her brother, father, and mother; Hypertension in her father. There is no history of Anesthesia problems,  Hypotension, Malignant hyperthermia, or Pseudochol deficiency.  ROS     Physical Exam:    VS:  BP 130/68   Pulse 69   Ht '4\' 11"'$  (1.499 m)   Wt 105 lb (47.6 kg)   SpO2 99%   BMI 21.21 kg/m     Wt Readings from Last 3 Encounters:  01/28/22 105 lb (47.6 kg)  09/22/21 105 lb (47.6 kg)  09/17/21 105 lb 3.2 oz (47.7 kg)    Physical Exam  GEN: Thin, in no acute distress  Neck: no JVD, carotid bruits, or masses Cardiac:RRR;S4 2/6 systolic murmur LSB Respiratory:  clear to auscultation bilaterally, normal work of breathing GI: soft, nontender, nondistended, + BS Ext: without cyanosis, clubbing, or edema, Good distal pulses bilaterally Neuro:  Alert and Oriented x 3,  Psych: euthymic mood, full affect        EKGs/Labs/Other Test Reviewed:    EKG:  EKG is  not ordered today.     Recent Labs: 09/17/2021: BNP 150.5; BUN 9; Creatinine, Ser 0.60; Hemoglobin 14.2; Platelets 257; Potassium 4.8; Sodium 139   Recent Lipid Panel Recent Labs    09/17/21 1240  CHOL 177  TRIG 59  HDL 88  LDLCALC 78     Prior CV Studies:   NST 09/2021   The study is normal. The study is low risk.   No ST deviation was noted.   Left ventricular function is normal. Nuclear stress EF: 78 %. The left ventricular ejection fraction is hyperdynamic (>65%). End diastolic cavity size is normal. End systolic cavity size is normal.   Prior study available for comparison. No changes compared to prior study.   IMPRESSIONS Negative for stress induced arrhythmias.  Stress ECG nondiagnostic due to pharmacologic protocol. Normal left ventricular function and size. There is a perfusion defect in the apex in rest and at stress with normal wall motion consistent with artifact.   CONCLUSIONS Negative stress test. Low risk study. No change from prior.  Echocardiogram 02/20/2021    Left ventricular ejection fraction, by estimation, is 60 to 65%. The left ventricle has normal function. The left ventricle has no  regional wall motion abnormalities. Left ventricular diastolic parameters are consistent with Grade I diastolic dysfunction (impaired relaxation). Right ventricular systolic function is mildly reduced. The right ventricular size is normal. There is normal pulmonary artery systolic pressure. The estimated right ventricular systolic pressure is 48.5 mmHg. Left atrial size was mildly dilated. The mitral valve is abnormal. Trivial mitral valve regurgitation. The aortic valve is tricuspid. Aortic valve regurgitation is not visualized. Aortic valve sclerosis/calcification is present, without any evidence of aortic stenosis. The inferior vena cava is normal in size with greater than 50% respiratory variability, suggesting right atrial pressure of 3 mmHg.   Comparison(s): No significant change from prior study. 05/27/17 EF 60-65%.  PA pressure 62mHg.   Nuclear stress test 09/27/2018:  The left ventricular ejection fraction is hyperdynamic (>65%). Nuclear stress EF: 77%. There was no ST segment deviation noted during stress. This is a low risk study.   1. EF 77%, normal wall motion.  2. Fixed small, mild apical perfusion defect.  Given normal wall motion, this seems most likely to be soft tissue attenuation.  No ischemia.    Low risk study.    Risk Assessment/Calculations/Metrics:              ASSESSMENT & PLAN:   No problem-specific Assessment & Plan notes found for this encounter.   DOE normal lexiscan 09/2021. Breathing improved with PT  CAD CABG 2013-no angina. Continue ASA, Crestor, amlodipine, metoprolol  HTN BP well controlled on above meds  HLD-LDL 78 on crestor            Dispo:  No follow-ups on file.   Medication Adjustments/Labs and Tests Ordered: Current medicines are reviewed at length with the patient today.  Concerns regarding medicines are outlined above.  Tests Ordered: No orders of the defined types were placed in this encounter.  Medication Changes: No orders  of the defined types were placed in this encounter.  Signed, MErmalinda Barrios PA-C  01/28/2022 1:22 PM    [place]  [id number]: [phone] Fax: [phone]

## 2022-01-23 ENCOUNTER — Ambulatory Visit: Payer: Medicare HMO | Admitting: Cardiovascular Disease

## 2022-01-28 ENCOUNTER — Encounter: Payer: Self-pay | Admitting: Physician Assistant

## 2022-01-28 ENCOUNTER — Ambulatory Visit: Payer: Medicare HMO | Attending: Cardiovascular Disease | Admitting: Physician Assistant

## 2022-01-28 VITALS — BP 130/68 | HR 69 | Ht 59.0 in | Wt 105.0 lb

## 2022-01-28 DIAGNOSIS — R0609 Other forms of dyspnea: Secondary | ICD-10-CM | POA: Diagnosis not present

## 2022-01-28 DIAGNOSIS — I10 Essential (primary) hypertension: Secondary | ICD-10-CM | POA: Diagnosis not present

## 2022-01-28 DIAGNOSIS — I2510 Atherosclerotic heart disease of native coronary artery without angina pectoris: Secondary | ICD-10-CM

## 2022-01-28 DIAGNOSIS — E785 Hyperlipidemia, unspecified: Secondary | ICD-10-CM | POA: Diagnosis not present

## 2022-01-28 NOTE — Patient Instructions (Signed)
Medication Instructions:  Your physician recommends that you continue on your current medications as directed. Please refer to the Current Medication list given to you today.  *If you need a refill on your cardiac medications before your next appointment, please call your pharmacy*   Lab Work: None ordered   If you have labs (blood work) drawn today and your tests are completely normal, you will receive your results only by: [place] (if you have MyChart) OR A paper copy in the mail If you have any lab test that is abnormal or we need to change your treatment, we will call you to review the results.   Testing/Procedures: None ordered     Follow-Up: At [facility], you and your health needs are our priority.  As part of our continuing mission to provide you with exceptional heart care, we have created designated Provider Care Teams.  These Care Teams include your primary Cardiologist (physician) and Advanced Practice Providers (APPs -  Physician Assistants and Nurse Practitioners) who all work together to provide you with the care you need, when you need it.  We recommend signing up for the patient portal called "MyChart".  Sign up information is provided on this After Visit Summary.  MyChart is used to connect with patients for Virtual Visits (Telemedicine).  Patients are able to view lab/test results, encounter notes, upcoming appointments, etc.  Non-urgent messages can be sent to your provider as well.   To learn more about what you can do with MyChart, go to [email or web address].    Your next appointment:   6 month(s)  The format for your next appointment:   In Person  Provider:   Sanda Klein, MD     Other Instructions   Important Information About Sugar

## 2022-05-03 ENCOUNTER — Emergency Department (HOSPITAL_BASED_OUTPATIENT_CLINIC_OR_DEPARTMENT_OTHER): Payer: 59

## 2022-05-03 ENCOUNTER — Encounter (HOSPITAL_BASED_OUTPATIENT_CLINIC_OR_DEPARTMENT_OTHER): Payer: Self-pay | Admitting: Emergency Medicine

## 2022-05-03 ENCOUNTER — Emergency Department (HOSPITAL_BASED_OUTPATIENT_CLINIC_OR_DEPARTMENT_OTHER)
Admission: EM | Admit: 2022-05-03 | Discharge: 2022-05-03 | Disposition: A | Discharge status: Home / Self Care | Payer: 59 | Attending: Emergency Medicine | Admitting: Emergency Medicine

## 2022-05-03 ENCOUNTER — Other Ambulatory Visit: Payer: Self-pay

## 2022-05-03 DIAGNOSIS — I10 Essential (primary) hypertension: Secondary | ICD-10-CM | POA: Insufficient documentation

## 2022-05-03 DIAGNOSIS — Z7982 Long term (current) use of aspirin: Secondary | ICD-10-CM | POA: Insufficient documentation

## 2022-05-03 DIAGNOSIS — M25552 Pain in left hip: Secondary | ICD-10-CM | POA: Insufficient documentation

## 2022-05-03 DIAGNOSIS — F419 Anxiety disorder, unspecified: Secondary | ICD-10-CM | POA: Insufficient documentation

## 2022-05-03 DIAGNOSIS — I2510 Atherosclerotic heart disease of native coronary artery without angina pectoris: Secondary | ICD-10-CM | POA: Insufficient documentation

## 2022-05-03 DIAGNOSIS — K5900 Constipation, unspecified: Secondary | ICD-10-CM | POA: Insufficient documentation

## 2022-05-03 DIAGNOSIS — Z79899 Other long term (current) drug therapy: Secondary | ICD-10-CM | POA: Diagnosis not present

## 2022-05-03 DIAGNOSIS — Z951 Presence of aortocoronary bypass graft: Secondary | ICD-10-CM | POA: Diagnosis not present

## 2022-05-03 DIAGNOSIS — R079 Chest pain, unspecified: Secondary | ICD-10-CM | POA: Diagnosis not present

## 2022-05-03 DIAGNOSIS — R52 Pain, unspecified: Secondary | ICD-10-CM

## 2022-05-03 LAB — BASIC METABOLIC PANEL
Anion gap: 10 (ref 5–15)
BUN: 9 mg/dL (ref 8–23)
CO2: 24 mmol/L (ref 22–32)
Calcium: 9.2 mg/dL (ref 8.9–10.3)
Chloride: 101 mmol/L (ref 98–111)
Creatinine, Ser: 0.62 mg/dL (ref 0.44–1.00)
GFR, Estimated: >60 mL/min (ref >60)
Glucose, Bld: 91 mg/dL (ref 70–99)
Potassium: 3.6 mmol/L (ref 3.5–5.1)
Sodium: 135 mmol/L (ref 135–145)

## 2022-05-03 LAB — TROPONIN I (HIGH SENSITIVITY)
Troponin I (High Sensitivity): 7 ng/L (ref <18)
Troponin I (High Sensitivity): 12 ng/L (ref <18)

## 2022-05-03 LAB — URINALYSIS, W/ REFLEX TO CULTURE (INFECTION SUSPECTED)
Bacteria, UA: NONE SEEN
Bilirubin Urine: NEGATIVE
Glucose, UA: NEGATIVE mg/dL
Hgb urine dipstick: NEGATIVE
Ketones, ur: 80 mg/dL — AB
Leukocytes,Ua: NEGATIVE
Nitrite: NEGATIVE
Protein, ur: NEGATIVE mg/dL
RBC / HPF: NONE SEEN RBC/hpf (ref 0–5)
Specific Gravity, Urine: 1.02 (ref 1.005–1.030)
pH: 7 (ref 5.0–8.0)

## 2022-05-03 LAB — CK: Total CK: 116 U/L (ref 38–234)

## 2022-05-03 LAB — HEPATIC FUNCTION PANEL
ALT: 16 U/L (ref 0–44)
AST: 25 U/L (ref 15–41)
Albumin: 4.4 g/dL (ref 3.5–5.0)
Alkaline Phosphatase: 99 U/L (ref 38–126)
Bilirubin, Direct: 0.1 mg/dL (ref 0.0–0.2)
Indirect Bilirubin: 0.8 mg/dL (ref 0.3–0.9)
Total Bilirubin: 0.9 mg/dL (ref 0.3–1.2)
Total Protein: 7.6 g/dL (ref 6.5–8.1)

## 2022-05-03 LAB — CBC
HCT: 41.8 % (ref 36.0–46.0)
Hemoglobin: 13.8 g/dL (ref 12.0–15.0)
MCH: 29.9 pg (ref 26.0–34.0)
MCHC: 33 g/dL (ref 30.0–36.0)
MCV: 90.5 fL (ref 80.0–100.0)
Platelets: 245 10*3/uL (ref 150–400)
RBC: 4.62 MIL/uL (ref 3.87–5.11)
RDW: 15.1 % (ref 11.5–15.5)
WBC: 4 10*3/uL (ref 4.0–10.5)
nRBC: 0 % (ref 0.0–0.2)

## 2022-05-03 LAB — LIPASE, BLOOD: Lipase: 38 U/L (ref 11–51)

## 2022-05-03 MED ORDER — IOHEXOL 300 MG/ML  SOLN
60.0000 mL | Freq: Once | INTRAMUSCULAR | Status: AC | PRN
  Administered 2022-05-03: 60 mL via INTRAVENOUS

## 2022-05-03 MED ORDER — LORAZEPAM 0.5 MG PO TABS: 0.5000 mg | ORAL_TABLET | Freq: Two times a day (BID) | ORAL | 0 refills | Status: DC | PRN

## 2022-05-03 MED ORDER — SODIUM CHLORIDE 0.9 % IV BOLUS
500.0000 mL | Freq: Once | INTRAVENOUS | Status: AC
  Administered 2022-05-03: 500 mL via INTRAVENOUS

## 2022-05-03 NOTE — ED Provider Notes (Signed)
3:18 PM Care assumed from Dr. Regenia Skeeter.  At time of transfer of care, patient is waiting for results of CT chest/abdomen/pelvis to rule out concerning etiology of her symptoms.  If workup reassuring, plan of care with discharge home for outpatient follow-up for chronic problems.  5:34 PM ET scan today did not show evidence of acute pathology requiring intervention.  It did show some constipation but the abnormalities on x-ray appear to be mucous plugging and some inflammation.  No convincing evidence of acute pneumonia and patient does not have any fevers, chills, or new cough today.  She reports no chronic cough but not at the moment.  We agreed to give her prescription for some anxiety medicine for the panic and stress that she has been having and she will follow-up with her primary doctor in the next few days.  She had no other questions or concerns and was discharged in good condition.  Clinical Impression: 1. Constipation, unspecified constipation type   2. Pain   3. Anxiety     Disposition: Discharge  Condition: Good  I have discussed the results, Dx and Tx plan with the pt(& family if present). He/she/they expressed understanding and agree(s) with the plan. Discharge instructions discussed at great length. Strict return precautions discussed and pt &/or family have verbalized understanding of the instructions. No further questions at time of discharge.    New Prescriptions   LORAZEPAM (ATIVAN) 0.5 MG TABLET    Take 1 tablet (0.5 mg total) by mouth 2 (two) times daily as needed for anxiety.    Follow Up: [facility] [place] [id number] [place] [facility] [id number] [place] [phone]       Aeson Sawyers, Gwenyth Allegra, MD 05/03/22 1734

## 2022-05-03 NOTE — ED Notes (Signed)
ED Provider at bedside. 

## 2022-05-03 NOTE — ED Triage Notes (Addendum)
Patient brought in by family with c/o left sided muscle spasms to left leg that started in the middle of the week. Patient also c/o having keloids in her chest causing chest pain, left back pain, left arm, left flank pain, and left hip pain.

## 2022-05-03 NOTE — Discharge Instructions (Signed)
Your history, exam, and workup today was overall reassuring. We did find evidence of some dehydration and some constipation.  Please rest and stay hydrated and for the anxiety, please consider taking the low-dose of the anxiety medicine for acute exacerbation and panic attack that we discussed.  This is not a long-term medication.  Please follow-up with your primary doctor in the next few days to get reassessed and make a long-term plan.  If any symptoms change or worsen acutely, please return to the nearest emergency department.

## 2022-05-03 NOTE — ED Provider Notes (Signed)
[facility] [place] AT [facility] Provider Note   CSN: [id number] Arrival date & time: 05/03/22  1241     History  Chief Complaint  Patient presents with   Spasms    Theresa Crosby is a 89 y.o. female.  HPI 89 year old female with a history of CAD status post CABG, anxiety, arthritis, GERD, hypertension, "neuromuscular disorder" and multiple other comorbidities presents with left hip pain.  Overall her symptoms have been going on since about 2018.  She had a left hip replacement been having a lot of trouble ever since.  She states that she chronically has a lot of pain in her left hip but she also has hypersensitivity and pain to the entire left side of her body for the several years.  This includes head/face, back, left arm and left leg.  She is also been having abdominal pain though she states that it has been going on for a couple years as well.  However she is worried because she is more constipated over this past week and has been having left-sided chest pain for this past week.  No fevers to her knowledge.  There is no weakness though it was painful for her to walk.  She has been taking Tylenol which at first seem to work when these symptoms first originated but is no longer helping.  Home Medications Prior to Admission medications   Medication Sig Start Date End Date Taking? Authorizing Provider  acetaminophen (TYLENOL) 325 MG tablet     [provider]  amLODipine (NORVASC) 10 MG tablet Take 10 tablets by mouth daily. 03/21/20   [provider]  aspirin EC 81 MG tablet Take 1 tablet (81 mg total) by mouth daily. 11/02/11   Monika Salk, MD  Calcium-Vitamin D-Vitamin K (CALCIUM SOFT CHEWS PO) Take by mouth daily.    [provider]  Cholecalciferol (VITAMIN D3) 1000 UNITS CAPS Take 2,000 Units by mouth every morning.      [provider]  clobetasol ointment (TEMOVATE) 0.05 % Patient applies 3 times weekly 01/25/20    [provider]  diclofenac Sodium (VOLTAREN) 1 % GEL Apply 1 Application topically 2 (two) times daily as needed. 11/03/21   [provider]  loratadine (CLARITIN) 10 MG tablet Take 10 mg by mouth daily as needed for allergies.    [provider]  LUMIGAN 0.01 % SOLN Place 1 drop into both eyes at bedtime.    [provider]  magnesium oxide (MAG-OX) 400 MG tablet Take 400 mg by mouth daily.    [provider]  metoprolol tartrate (LOPRESSOR) 25 MG tablet Take 1 tablet (25 mg total) by mouth 2 (two) times daily. 06/26/19   Kroeger, Lorelee Cover., PA-C  omeprazole (PRILOSEC) 20 MG capsule Take 1 capsule (20 mg total) by mouth daily. 11/22/20   Plunkett, Loree Fee, MD  RHOPRESSA 0.02 % SOLN Apply 1 drop to eye at bedtime.    [provider]  rosuvastatin (CRESTOR) 10 MG tablet Take 10 mg by mouth at bedtime. 07/13/21   [provider]  timolol (TIMOPTIC) 0.5 % ophthalmic solution Place 1 drop into both eyes 2 (two) times daily. 09/12/21   [provider]  Travoprost, BAK Free, (TRAVATAN) 0.004 % SOLN ophthalmic solution Place 1 drop into both eyes at bedtime. Patient not taking: Reported on 01/28/2022 07/30/21   [provider]      Allergies    Aspirin, Lactose intolerance (gi), and Brimonidine  Review of Systems   Review of Systems  Constitutional:  Negative for fever.  Cardiovascular:  Positive for chest pain.  Gastrointestinal:  Positive for abdominal pain and constipation. Negative for vomiting.  Musculoskeletal:  Positive for arthralgias, back pain and myalgias.  Neurological:  Positive for numbness (chronic tingling). Negative for weakness and headaches.    Physical Exam Updated Vital Signs BP (!) 151/101   Pulse 90   Temp 98.4 F (36.9 C) (Oral)   Resp 18   Ht 4\' 11"  (1.499 m)   Wt 46.1 kg   SpO2 96%   BMI 20.52 kg/m  Physical Exam Vitals and nursing note reviewed.  Constitutional:      Appearance:  She is well-developed.  HENT:     Head: Normocephalic and atraumatic.  Cardiovascular:     Rate and Rhythm: Normal rate and regular rhythm.     Pulses:          Radial pulses are 2+ on the left side.       Dorsalis pedis pulses are 2+ on the left side.     Heart sounds: Normal heart sounds.  Pulmonary:     Effort: Pulmonary effort is normal.     Breath sounds: Normal breath sounds.  Abdominal:     Palpations: Abdomen is soft.     Tenderness: There is abdominal tenderness (generalized).  Musculoskeletal:     Comments: Patient is very sensitive to touch anywhere throughout the left side of her body.  This includes her left face/scalp, diffuse thoracic and lumbar left back, left lateral hip and entire left lower extremity.  I do not appreciate any significant joint or extremity swelling.  There is no obvious infection to her surgical sites.  Skin:    General: Skin is warm and dry.  Neurological:     Mental Status: She is alert.     Comments: Patient has no facial droop or slurred speech.  She has equal strength in all 4 extremities, a little limited in the left lower extremity due to pain but she has intact strength.     ED Results / Procedures / Treatments   Labs (all labs ordered are listed, but only abnormal results are displayed) Labs Reviewed  BASIC METABOLIC PANEL  CBC  LIPASE, BLOOD  HEPATIC FUNCTION PANEL  CK  URINALYSIS, W/ REFLEX TO CULTURE (INFECTION SUSPECTED)  TROPONIN I (HIGH SENSITIVITY)  TROPONIN I (HIGH SENSITIVITY)    EKG EKG Interpretation  Date/Time:  Sunday May 03 2022 12:58:20 EDT Ventricular Rate:  90 PR Interval:  164 QRS Duration: 80 QT Interval:  365 QTC Calculation: 447 R Axis:   66 Text Interpretation: Sinus rhythm Ventricular premature complex Probable left atrial enlargement RSR' in V1 or V2, probably normal variant no acute ST/T changes similar to 2018 Confirmed by Sherwood Gambler [phone]) on 05/03/2022 1:26:35 PM  Radiology DG Chest 2  View  Result Date: 05/03/2022 CLINICAL DATA:  Chest pain EXAM: CHEST - 2 VIEW COMPARISON:  06/26/2016 FINDINGS: The lungs are clear without focal pneumonia, edema, pneumothorax or pleural effusion. Tiny bilateral pulmonary nodules are identified. The cardiopericardial silhouette is within normal limits for size. The visualized bony structures of the thorax are unremarkable. IMPRESSION: 1. No acute cardiopulmonary findings. 2. Tiny bilateral pulmonary nodules are new in the interval. CT chest without contrast recommended to further evaluate. Electronically Signed   By: Misty Stanley M.D.   On: 05/03/2022 13:30    Procedures Procedures    Medications Ordered in ED Medications  sodium chloride 0.9 % bolus 500 mL (has no administration in time range)    ED Course/ Medical Decision Making/ A&P                             Medical Decision Making Amount and/or Complexity of Data Reviewed External Data Reviewed: notes. Labs: ordered. Radiology: ordered.    Details: No CHF/Pneumonia.   Patient's symptoms mostly seem chronic.  However she is concerned about some constipation and abdominal pain though the abdominal pains been for several years.  Overall she is hypertensive but otherwise well-appearing.  Initial labs are overall unremarkable.  Plan will be for CTs and I did make her aware of the nodule seen on the chest x-ray.  However she continues to decline pain.  Will ambulate her to the bathroom to see that she can bear weight.  Otherwise, care transferred to Dr. Sherry Ruffing.        Final Clinical Impression(s) / ED Diagnoses Final diagnoses:  None    Rx / DC Orders ED Discharge Orders     None         Sherwood Gambler, MD 05/03/22 1520

## 2022-05-06 ENCOUNTER — Emergency Department (HOSPITAL_BASED_OUTPATIENT_CLINIC_OR_DEPARTMENT_OTHER)
Admission: EM | Admit: 2022-05-06 | Discharge: 2022-05-06 | Disposition: A | Discharge status: Home / Self Care | Payer: 59 | Attending: Emergency Medicine | Admitting: Emergency Medicine

## 2022-05-06 ENCOUNTER — Encounter (HOSPITAL_BASED_OUTPATIENT_CLINIC_OR_DEPARTMENT_OTHER): Payer: Self-pay

## 2022-05-06 DIAGNOSIS — Z7982 Long term (current) use of aspirin: Secondary | ICD-10-CM | POA: Diagnosis not present

## 2022-05-06 DIAGNOSIS — K5900 Constipation, unspecified: Secondary | ICD-10-CM | POA: Diagnosis not present

## 2022-05-06 DIAGNOSIS — M25559 Pain in unspecified hip: Secondary | ICD-10-CM | POA: Insufficient documentation

## 2022-05-06 DIAGNOSIS — Z79899 Other long term (current) drug therapy: Secondary | ICD-10-CM | POA: Diagnosis not present

## 2022-05-06 MED ORDER — LACTULOSE 10 G PO PACK: 10.0000 g | PACK | Freq: Every day | ORAL | 0 refills | Status: AC

## 2022-05-06 MED ORDER — LACTULOSE 10 GM/15ML PO SOLN
10.0000 g | Freq: Once | ORAL | Status: AC
  Administered 2022-05-06: 10 g via ORAL

## 2022-05-06 MED ORDER — FLEET ENEMA 7-19 GM/118ML RE ENEM
1.0000 | ENEMA | Freq: Once | RECTAL | Status: AC
  Administered 2022-05-06: 1 via RECTAL
  Filled 2022-05-06: qty 1

## 2022-05-06 MED ORDER — DOCUSATE SODIUM 250 MG PO CAPS: 250.0000 mg | ORAL_CAPSULE | Freq: Every day | ORAL | 0 refills | Status: AC

## 2022-05-06 MED ORDER — DOCUSATE SODIUM 100 MG PO CAPS
100.0000 mg | ORAL_CAPSULE | Freq: Once | ORAL | Status: AC
  Administered 2022-05-06: 100 mg via ORAL
  Filled 2022-05-06: qty 1

## 2022-05-06 NOTE — ED Notes (Signed)
Reviewed discharge instructions with pt. Pt states understanding

## 2022-05-06 NOTE — ED Notes (Signed)
Pt waiting for grandson to pick up

## 2022-05-06 NOTE — Discharge Instructions (Signed)
I have sent you in Colace and lactulose to use for constipation.  Also recommend using 2 packets of MiraLAX in 20 ounces of water daily until you are having nice easy bowel movements.

## 2022-05-06 NOTE — ED Notes (Signed)
Enema administered . Pt up to [facility] with large amount of soft brown stool

## 2022-05-06 NOTE — ED Provider Notes (Signed)
[facility] Provider Note   CSN: [id number] Arrival date & time: 05/06/22  [id number]     History  Chief Complaint  Patient presents with   Constipation    Theresa Crosby is a 89 y.o. female.  Patient here with ongoing constipation.  Seen here 2 days ago for the same.  Has not taken any medication to help.  She has been taking 1 packet of MiraLAX daily.  She tried some special tea, some prunes with no relief.  She does not have any cough or sputum production.  She still having some hip pain as well.  Denies any fever or chills.  No nausea or vomiting.  Nothing makes it worse or better.  The history is provided by the patient.       Home Medications Prior to Admission medications   Medication Sig Start Date End Date Taking? Authorizing Provider  docusate sodium (COLACE) 250 MG capsule Take 1 capsule (250 mg total) by mouth daily for 14 days. 05/06/22 05/20/22 Yes Love Chowning, DO  lactulose (CEPHULAC) 10 g packet Take 1 packet (10 g total) by mouth daily. 05/06/22  Yes Emelina Hinch, DO  acetaminophen (TYLENOL) 325 MG tablet     [provider]  amLODipine (NORVASC) 10 MG tablet Take 10 tablets by mouth daily. 03/21/20   [provider]  aspirin EC 81 MG tablet Take 1 tablet (81 mg total) by mouth daily. 11/02/11   Monika Salk, MD  Calcium-Vitamin D-Vitamin K (CALCIUM SOFT CHEWS PO) Take by mouth daily.    [provider]  Cholecalciferol (VITAMIN D3) 1000 UNITS CAPS Take 2,000 Units by mouth every morning.      [provider]  clobetasol ointment (TEMOVATE) 0.05 % Patient applies 3 times weekly 01/25/20   [provider]  diclofenac Sodium (VOLTAREN) 1 % GEL Apply 1 Application topically 2 (two) times daily as needed. 11/03/21   [provider]  loratadine (CLARITIN) 10 MG tablet Take 10 mg by mouth daily as needed for allergies.    [provider]  LORazepam (ATIVAN) 0.5 MG  tablet Take 1 tablet (0.5 mg total) by mouth 2 (two) times daily as needed for anxiety. 05/03/22   Tegeler, Gwenyth Allegra, MD  LUMIGAN 0.01 % SOLN Place 1 drop into both eyes at bedtime.    [provider]  magnesium oxide (MAG-OX) 400 MG tablet Take 400 mg by mouth daily.    [provider]  metoprolol tartrate (LOPRESSOR) 25 MG tablet Take 1 tablet (25 mg total) by mouth 2 (two) times daily. 06/26/19   Kroeger, Lorelee Cover., PA-C  omeprazole (PRILOSEC) 20 MG capsule Take 1 capsule (20 mg total) by mouth daily. 11/22/20   Plunkett, Loree Fee, MD  RHOPRESSA 0.02 % SOLN Apply 1 drop to eye at bedtime.    [provider]  rosuvastatin (CRESTOR) 10 MG tablet Take 10 mg by mouth at bedtime. 07/13/21   [provider]  timolol (TIMOPTIC) 0.5 % ophthalmic solution Place 1 drop into both eyes 2 (two) times daily. 09/12/21   [provider]  Travoprost, BAK Free, (TRAVATAN) 0.004 % SOLN ophthalmic solution Place 1 drop into both eyes at bedtime. Patient not taking: Reported on 01/28/2022 07/30/21   [provider]      Allergies    Aspirin, Lactose intolerance (gi), and Brimonidine    Review of Systems   Review of Systems  Physical Exam Updated Vital Signs BP (!) 158/102  Pulse 75   Temp 97.9 F (36.6 C)   Resp 18   Ht 4\' 11"  (1.499 m)   Wt 46 kg   SpO2 100%   BMI 20.48 kg/m  Physical Exam Vitals and nursing note reviewed.  Constitutional:      General: She is not in acute distress.    Appearance: She is well-developed. She is not ill-appearing.  HENT:     Head: Normocephalic and atraumatic.     Nose: Nose normal.     Mouth/Throat:     Mouth: Mucous membranes are moist.  Eyes:     Extraocular Movements: Extraocular movements intact.     Conjunctiva/sclera: Conjunctivae normal.     Pupils: Pupils are equal, round, and reactive to light.  Cardiovascular:     Rate and Rhythm: Normal rate and regular rhythm.     Pulses: Normal pulses.      Heart sounds: Normal heart sounds. No murmur heard. Pulmonary:     Effort: Pulmonary effort is normal. No respiratory distress.     Breath sounds: Normal breath sounds.  Abdominal:     General: Abdomen is flat.     Palpations: Abdomen is soft.     Tenderness: There is no abdominal tenderness.  Musculoskeletal:        General: No swelling.     Cervical back: Normal range of motion and neck supple.  Skin:    General: Skin is warm and dry.     Capillary Refill: Capillary refill takes less than 2 seconds.  Neurological:     General: No focal deficit present.     Mental Status: She is alert.  Psychiatric:        Mood and Affect: Mood normal.     ED Results / Procedures / Treatments   Labs (all labs ordered are listed, but only abnormal results are displayed) Labs Reviewed - No data to display  EKG None  Radiology No results found.  Procedures Procedures    Medications Ordered in ED Medications  docusate sodium (COLACE) capsule 100 mg (100 mg Oral Given 05/06/22 0909)  sodium phosphate (FLEET) 7-19 GM/118ML enema 1 enema (1 enema Rectal Given 05/06/22 0910)  lactulose (CHRONULAC) 10 GM/15ML solution 10 g (10 g Oral Given 05/06/22 [id number])    ED Course/ Medical Decision Making/ A&P                             Medical Decision Making Risk OTC drugs. Prescription drug management.   [name] is here with ongoing constipation.  Denies any nausea vomiting.  Having some hip pain but per my review of workup done about 2 days ago here she had unremarkable workup including chest x-ray, CT scan abdomen pelvis and hip imaging.  Lab work was normal.  She has been only using 1 dose of MiraLAX daily.  She has had a bowel movement yet.  There is no sign of rectal impaction.  She denies any rectal impaction.  She had a pretty prominent stool burden throughout.  Overall her abdominal exam is benign.  She is very well-appearing.  She has been trying mostly home remedies for  constipation.  Will give her a dose of lactulose, give her a Fleet enema as well as give her a dose of docusate.  Will put her on a more aggressive bowel regimen at home.  At this time I do not think any repeat workup is needed.  She is  very well-appearing.  She had good bowel movement after enema and feeling much better.    Will have her follow-up with primary care doctor.  This chart was dictated using voice recognition software.  Despite best efforts to proofread,  errors can occur which can change the documentation meaning.         Final Clinical Impression(s) / ED Diagnoses Final diagnoses:  Constipation, unspecified constipation type    Rx / DC Orders ED Discharge Orders          Ordered    docusate sodium (COLACE) 250 MG capsule  Daily        05/06/22 0855    lactulose (CEPHULAC) 10 g packet  Daily        05/06/22 0857              Lennice Sites, DO 05/06/22 [phone]

## 2022-05-06 NOTE — ED Triage Notes (Addendum)
Pt brought in by family. Pt reports dry mouth and tongue burning when she lays down. Conts with abdominal pain, no BM x 1 week. Left leg pain conts. Reports not taking meds given last visit. Also adds cough with thick mucous

## 2022-07-27 ENCOUNTER — Emergency Department (HOSPITAL_BASED_OUTPATIENT_CLINIC_OR_DEPARTMENT_OTHER)
Admission: EM | Admit: 2022-07-27 | Discharge: 2022-07-27 | Disposition: A | Discharge status: Home / Self Care | Payer: 59 | Attending: Emergency Medicine | Admitting: Emergency Medicine

## 2022-07-27 ENCOUNTER — Emergency Department (HOSPITAL_BASED_OUTPATIENT_CLINIC_OR_DEPARTMENT_OTHER): Payer: 59

## 2022-07-27 ENCOUNTER — Other Ambulatory Visit: Payer: Self-pay

## 2022-07-27 ENCOUNTER — Ambulatory Visit: Payer: Self-pay

## 2022-07-27 ENCOUNTER — Encounter (HOSPITAL_BASED_OUTPATIENT_CLINIC_OR_DEPARTMENT_OTHER): Payer: Self-pay | Admitting: Emergency Medicine

## 2022-07-27 DIAGNOSIS — M542 Cervicalgia: Secondary | ICD-10-CM | POA: Diagnosis present

## 2022-07-27 DIAGNOSIS — Z7982 Long term (current) use of aspirin: Secondary | ICD-10-CM | POA: Diagnosis not present

## 2022-07-27 DIAGNOSIS — M4722 Other spondylosis with radiculopathy, cervical region: Secondary | ICD-10-CM | POA: Insufficient documentation

## 2022-07-27 MED ORDER — LIDOCAINE 5 % EX PTCH
1.0000 | MEDICATED_PATCH | CUTANEOUS | Status: DC
  Administered 2022-07-27: 1 via TRANSDERMAL
  Filled 2022-07-27: qty 1

## 2022-07-27 NOTE — Discharge Instructions (Addendum)
Follow up with ENT as scheduled. You do not have a significant amount of wax in your ear.  Apply warm compress to sore muscles for 20 minutes at a time.  Apply Voltaren gel to area as needed as directed.  Apply lidocaine patch as needed if the patch applied in the ER helped with your pain (these are available over the counter at your pharmacy).   Recheck with your PCP, call to schedule an appointment.

## 2022-07-27 NOTE — ED Provider Notes (Signed)
[place] Provider Note   CSN: [id number] Arrival date & time: 07/27/22  1418     History  Chief Complaint  Patient presents with   Otalgia    Theresa Crosby is a 89 y.o. female.  89 y.o. female  was evaluated in triage.  Pt complains of left ear clicking, pain in left side neck. No falls or injuries, went to PCP who told her she needed to have wax cleaned out of her ear.       Home Medications Prior to Admission medications   Medication Sig Start Date End Date Taking? Authorizing Provider  acetaminophen (TYLENOL) 325 MG tablet     [provider]  amLODipine (NORVASC) 10 MG tablet Take 10 tablets by mouth Crosby. 03/21/20   [provider]  aspirin EC 81 MG tablet Take 1 tablet (81 mg total) by mouth Crosby. 11/02/11   Laveda Norman, MD  Calcium-Vitamin D-Vitamin K (CALCIUM SOFT CHEWS PO) Take by mouth Crosby.    [provider]  Cholecalciferol (VITAMIN D3) 1000 UNITS CAPS Take 2,000 Units by mouth every morning.      [provider]  clobetasol ointment (TEMOVATE) 0.05 % Patient applies 3 times weekly 01/25/20   [provider]  diclofenac Sodium (VOLTAREN) 1 % GEL Apply 1 Application topically 2 (two) times Crosby as needed. 11/03/21   [provider]  lactulose (CEPHULAC) 10 g packet Take 1 packet (10 g total) by mouth Crosby. 05/06/22   Curatolo, Adam, DO  loratadine (CLARITIN) 10 MG tablet Take 10 mg by mouth Crosby as needed for allergies.    [provider]  LORazepam (ATIVAN) 0.5 MG tablet Take 1 tablet (0.5 mg total) by mouth 2 (two) times Crosby as needed for anxiety. 05/03/22   Tegeler, Canary Brim, MD  LUMIGAN 0.01 % SOLN Place 1 drop into both eyes at bedtime.    [provider]  magnesium oxide (MAG-OX) 400 MG tablet Take 400 mg by mouth Crosby.    [provider]  metoprolol tartrate (LOPRESSOR) 25 MG tablet Take 1 tablet (25 mg total) by mouth 2  (two) times Crosby. 06/26/19   Kroeger, Ovidio Kin., PA-C  omeprazole (PRILOSEC) 20 MG capsule Take 1 capsule (20 mg total) by mouth Crosby. 11/22/20   Plunkett, Alphonzo Lemmings, MD  RHOPRESSA 0.02 % SOLN Apply 1 drop to eye at bedtime.    [provider]  rosuvastatin (CRESTOR) 10 MG tablet Take 10 mg by mouth at bedtime. 07/13/21   [provider]  timolol (TIMOPTIC) 0.5 % ophthalmic solution Place 1 drop into both eyes 2 (two) times Crosby. 09/12/21   [provider]  Travoprost, BAK Free, (TRAVATAN) 0.004 % SOLN ophthalmic solution Place 1 drop into both eyes at bedtime. Patient not taking: Reported on 01/28/2022 07/30/21   [provider]      Allergies    Aspirin, Lactose intolerance (gi), and Brimonidine    Review of Systems   Review of Systems Negative except as per HPI Physical Exam Updated Vital Signs BP (!) 187/93   Pulse 70   Temp 97.7 F (36.5 C)   Resp 19   Ht 4\' 11"  (1.499 m)   Wt 45.8 kg   SpO2 98%   BMI 20.40 kg/m  Physical Exam Vitals and nursing note reviewed.  Constitutional:      General: She is not in acute distress.    Appearance: She is well-developed. She is not diaphoretic.  HENT:     Head: Normocephalic and atraumatic.     Right Ear: Tympanic membrane and ear canal normal.     Left Ear: Tympanic membrane and ear canal normal.  Cardiovascular:     Pulses: Normal pulses.  Pulmonary:     Effort: Pulmonary effort is normal.  Musculoskeletal:     Cervical back: Tenderness present. No bony tenderness. Pain with movement present.     Thoracic back: No tenderness or bony tenderness.       Back:  Skin:    General: Skin is warm and dry.     Findings: No erythema or rash.  Neurological:     Mental Status: She is alert and oriented to person, place, and time.     Motor: No weakness.  Psychiatric:        Behavior: Behavior normal.     ED Results / Procedures / Treatments   Labs (all labs ordered are listed, but only abnormal  results are displayed) Labs Reviewed - No data to display  EKG None  Radiology CT Cervical Spine Wo Contrast  Result Date: 07/27/2022 CLINICAL DATA:  Cervical radiculopathy, no red flags EXAM: CT CERVICAL SPINE WITHOUT CONTRAST TECHNIQUE: Multidetector CT imaging of the cervical spine was performed without intravenous contrast. Multiplanar CT image reconstructions were also generated. RADIATION DOSE REDUCTION: This exam was performed according to the departmental dose-optimization program which includes automated exposure control, adjustment of the mA and/or kV according to patient size and/or use of iterative reconstruction technique. COMPARISON:  None Available. FINDINGS: Alignment: Broad-based oversew of normal lordosis. Dextroscoliotic curvature. Grade 1 anterolisthesis of C3 on C4. Skull base and vertebrae: No acute fracture. No primary bone lesion or focal pathologic process. Moderate pannus at C1-C2. Soft tissues and spinal canal: No prevertebral fluid or swelling. No visible canal hematoma. Disc levels: Diffuse degenerative disc disease with disc space narrowing and spurring. Moderate multilevel facet hypertrophy. There is multilevel bony neural foraminal stenosis. There may be mild canal stenosis at C5-C6. Upper chest: No acute findings. Other: No mastoid effusion. IMPRESSION: Moderate multilevel degenerative disc disease and facet hypertrophy. Multilevel bony neural foraminal stenosis. Possible mild canal stenosis at C5-C6. No acute bony abnormality. Electronically Signed   By: Narda Rutherford M.D.   On: 07/27/2022 16:53    Procedures Procedures    Medications Ordered in ED Medications  lidocaine (LIDODERM) 5 % 1 patch (has no administration in time range)    ED Course/ Medical Decision Making/ A&P                             Medical Decision Making Amount and/or Complexity of Data Reviewed Radiology: ordered.  Risk Prescription drug management.   This patient presents to  the ED for concern of left ear clicking, left side neck pain, this involves an extensive number of treatment options, and is a complaint that carries with it a high risk of complications and morbidity.  The differential diagnosis includes but not limited to cerumen impaction, spondylosis, DDD   Co morbidities that complicate the patient evaluation  Arthritis, HTN, GERD, neuromuscular disorder, CAD, HLD   Additional history obtained:  External records from outside source obtained and reviewed including prior imaging reviewed, no prior recent cervical spine imaging on file   Imaging Studies ordered:  I ordered imaging studies including CT c-spine  I independently visualized and interpreted imaging which showed degenerative changes I agree with the radiologist interpretation  Problem List / ED Course / Critical interventions / Medication management  89 year old female presents with complaint of left side neck pain and a clicking sound in her left ear.  States that she went to her PCP who told her she needed wax removed from her left ear canal and is unable to follow-up with ENT until August.  TM exam is unremarkable, left ear canal has a small amount of cerumen which does not obstruct or occlude the canal.  She has tenderness along the SCM proximally as well as left trapezius areas, denies weakness or numbness in her left hand.  Pain is worse with movement of her head.  Suspect degenerative changes in the neck contributing to pain in this area.  CT shows degenerative changes.  Discussed results with patient.  Will apply lidocaine patch today.  Advised that she can apply her Voltaren to this area as well and follow-up with her PCP. I ordered medication including lidocaine patch for pain Reevaluation of the patient after these medicines showed that the patient  applied at time of discharge I have reviewed the patients home medicines and have made adjustments as needed   Social Determinants  of Health:  Has PCP for follow-up   Test / Admission - Considered:  Consider CT mastoid process however there are no overlying changes to suggest mastoiditis         Final Clinical Impression(s) / ED Diagnoses Final diagnoses:  Osteoarthritis of spine with radiculopathy, cervical region    Rx / DC Orders ED Discharge Orders     None         Jeannie Fend, PA-C 07/27/22 1740    Virgina Norfolk, DO 07/27/22 1945

## 2022-07-27 NOTE — ED Provider Triage Note (Signed)
Emergency Medicine Provider Triage Evaluation Note  Theresa Crosby , a 89 y.o. female  was evaluated in triage.  Pt complains of left ear clicking, pain in left side neck. No falls or injuries, went to PCP who told her she needed to have wax cleaned out of her ear.  Review of Systems  Positive: Neck pain, ear clicking  Negative: Weakness, numbness   Physical Exam  BP (!) 185/91 (BP Location: Right Arm)   Pulse 71   Temp 97.7 F (36.5 C)   Resp 20   Ht 4\' 11"  (1.499 m)   Wt 45.8 kg   SpO2 98%   BMI 20.40 kg/m  Gen:   Awake, no distress   Resp:  Normal effort  MSK:   Moves extremities without difficulty  Other:  TTP left trap, left scm at mastoid. No erythema or swelling over mastoid. Left TM with scan cerumen, not impacted   Medical Decision Making  Medically screening exam initiated at 2:33 PM.  Appropriate orders placed.  FAATIMAH SPIELBERG was informed that the remainder of the evaluation will be completed by another provider, this initial triage assessment does not replace that evaluation, and the importance of remaining in the ED until their evaluation is complete.     Jeannie Fend, PA-C 07/27/22 1434

## 2022-07-27 NOTE — ED Notes (Signed)
Discharge instructions reviewed with patient. Patient questions answered and opportunity for education reviewed. Patient voices understanding of discharge instructions with no further questions. Patient ambulatory with steady gait to lobby.  

## 2022-07-27 NOTE — Telephone Encounter (Signed)
  Chief Complaint: Ear, facial, eye and neck pain Symptoms: above Frequency: Awhile Pertinent Negatives: Patient denies  Disposition: [] ED /[x] Urgent Care (no appt availability in office) / [] Appointment(In office/virtual)/ []  [facility]/ [] Home Care/ [] Refused Recommended Disposition /[] [facility]/ []  Follow-up with PCP Additional Notes: Pt was seen last week at opthalmology and told she had wax buildup in ear that needed to be cleaned out,. Pt aslo has complaints of eye pain, neck pain and facial pain. Pt will go to UC for evaluation.    Summary: Pain/Pressure in ear/neck, requesting to speak to nurse   Pt has pain/pressure in her ears/neck that she believes is because of wax build up. She called the main hospital line requesting to be seen at either UC or an ED but has clinical questions first.  Best contact: [phone]       Reason for Disposition  [1] SEVERE pain AND [2] not improved 2 hours after taking analgesic medication (e.g., ibuprofen or acetaminophen)  Answer Assessment - Initial Assessment Questions 1. LOCATION: "Which ear is involved?"     unsure 2. ONSET: "When did the ear start hurting"      Unsure 3. SEVERITY: "How bad is the pain?"  (Scale 1-10; mild, moderate or severe)   - MILD (1-3): doesn't interfere with normal activities    - MODERATE (4-7): interferes with normal activities or awakens from sleep    - SEVERE (8-10): excruciating pain, unable to do any normal activities      Moderate/ severe 4. URI SYMPTOMS: "Do you have a runny nose or cough?"     no 5. FEVER: "Do you have a fever?" If Yes, ask: "What is your temperature, how was it measured, and when did it start?"      6. CAUSE: "Have you been swimming recently?", "How often do you use Q-TIPS?", "Have you had any recent air travel or scuba diving?"      7. OTHER SYMPTOMS: "Do you have any other symptoms?" (e.g., headache, stiff neck, dizziness, vomiting, runny nose, decreased  hearing)     Facial pain, eye pain, neck pain  Protocols used: Earache-A-AH

## 2022-07-27 NOTE — ED Triage Notes (Signed)
Patient arrives ambulatory with cane by POV c/o left ear pain x 2 months. Reports a constant clicking noise. Also reports having pain to back of left side of head.  Patient adds having issues with her eyes. States she is being evaluated for glaucoma.

## 2022-08-03 ENCOUNTER — Ambulatory Visit: Payer: 59 | Attending: Cardiovascular Disease | Admitting: Cardiovascular Disease

## 2022-08-03 ENCOUNTER — Encounter: Payer: Self-pay | Admitting: Cardiovascular Disease

## 2022-08-03 VITALS — BP 128/62 | HR 68 | Ht 59.0 in | Wt 107.6 lb

## 2022-08-03 DIAGNOSIS — M353 Polymyalgia rheumatica: Secondary | ICD-10-CM | POA: Diagnosis not present

## 2022-08-03 DIAGNOSIS — I10 Essential (primary) hypertension: Secondary | ICD-10-CM | POA: Diagnosis not present

## 2022-08-03 DIAGNOSIS — I2510 Atherosclerotic heart disease of native coronary artery without angina pectoris: Secondary | ICD-10-CM | POA: Diagnosis not present

## 2022-08-03 DIAGNOSIS — E7800 Pure hypercholesterolemia, unspecified: Secondary | ICD-10-CM | POA: Diagnosis not present

## 2022-08-03 NOTE — Patient Instructions (Signed)
Medication Instructions:  No changes *If you need a refill on your cardiac medications before your next appointment, please call your pharmacy*   Lab Work: ESR, CRP- today If you have labs (blood work) drawn today and your tests are completely normal, you will receive your results only by: MyChart Message (if you have MyChart) OR A paper copy in the mail If you have any lab test that is abnormal or we need to change your treatment, we will call you to review the results.  Follow-Up: At [facility], you and your health needs are our priority.  As part of our continuing mission to provide you with exceptional heart care, we have created designated Provider Care Teams.  These Care Teams include your primary Cardiologist (physician) and Advanced Practice Providers (APPs -  Physician Assistants and Nurse Practitioners) who all work together to provide you with the care you need, when you need it.  We recommend signing up for the patient portal called "MyChart".  Sign up information is provided on this After Visit Summary.  MyChart is used to connect with patients for Virtual Visits (Telemedicine).  Patients are able to view lab/test results, encounter notes, upcoming appointments, etc.  Non-urgent messages can be sent to your provider as well.   To learn more about what you can do with MyChart, go to [email or web address].    Your next appointment:   1 year(s)  Provider:   Thurmon Fair, MD

## 2022-08-03 NOTE — Progress Notes (Signed)
Cardiology Office Note:    Date:  08/08/2022   ID:  Theresa Crosby, DOB 02-Oct-1932, MRN [id number]  PCP:  Jonna Clark, MD  [facility] Providers Cardiologist:  Thurmon Fair, MD     Referring MD: Center, [facility]   Chief Complaint:  Coronary Artery Disease     History of Present Illness:   Theresa Crosby is a 89 y.o. female with  PMH of CAD s/p CABG 2013 (LIMA-LAD, SVG-OM2, SVG-Diagonal, SVG-RCA), HTN, and HLD.    Her major complaints are musculoskeletal.  She hurts "all over" her hips, shoulders, back and legs all hurt.  Her keloid scar from the bypass surgery is still uncomfortable.  She has had problems with eyedrops for glaucoma.  She thinks she has an allergy to brimonidine.  She is trying to get back in to see her ophthalmologist.  She has not had angina pectoris at rest with activity and denies dyspnea, PND, orthopnea, lower extremity edema or claudication.  She has not had palpitations, dizziness, syncope or focal neurological events.      Echocardiogram 02/20/2021 shows normal left ventricular systolic function, at most mild diastolic dysfunction (parameters medically normal for age) and no significant valvular problems or pericardial disease.  Lower extremity ABI/duplex ultrasound showed no evidence of significant PAD.  Most recent nuclear stress test in August 2023 was normal, EF 78%, low risk. Previous heart monitor obtained on 08/26/2016 showed normal sinus rhythm, very rare PACs, rare relatively little increase in the heart rate during the day concerning for beta-blocker induced chronotropic incompetence.  She had symptoms of heart flutter that occurred in association with normal sinus rhythm.    Her blood pressure is well-controlled.  Most recent lipid profile showed an excellent HDL of 88 and a fair, albeit not perfect LDL of 78.  She does not have diabetes mellitus.  She has normal renal function.  She was planning to relocate to [place] to  live with her daughter, but they did not get along.  She is living alone in [place].    Past Medical History:  Diagnosis Date   Abnormal nuclear stress test 10/05/2011   Anxiety    2005- related to care of aging parent    Arthritis    degeneration - spondylosis, OA - all over body,    Blood transfusion    postop- back surg. 1987   Bunion    CAD (coronary artery disease)significant diseas w/ 99% LAD, 80% D1, 80% LCX 10/05/2011   GERD (gastroesophageal reflux disease)    Hyperlipemia    Hypertension    ireg. heartbeat, no referral made to cardiologist , sees [facility].    Neuromuscular disorder (HCC)    burning- L leg , numbing of toes    S/P CABG x 4, LIMA-LAD, SVG-OM2, SVG-diag, SVG-RCA 10/05/2011   Shortness of breath    Current Medications: Current Meds  Medication Sig   acetaminophen (TYLENOL) 325 MG tablet    amLODipine (NORVASC) 10 MG tablet Take 10 tablets by mouth daily.   aspirin EC 81 MG tablet Take 1 tablet (81 mg total) by mouth daily.   Calcium-Vitamin D-Vitamin K (CALCIUM SOFT CHEWS PO) Take by mouth daily.   Cholecalciferol (VITAMIN D3) 1000 UNITS CAPS Take 2,000 Units by mouth every morning.     diclofenac Sodium (VOLTAREN) 1 % GEL Apply 1 Application topically 2 (two) times daily as needed.   lactulose (CEPHULAC) 10 g packet Take 1 packet (10 g total) by mouth daily.  loratadine (CLARITIN) 10 MG tablet Take 10 mg by mouth daily as needed for allergies.   magnesium oxide (MAG-OX) 400 MG tablet Take 400 mg by mouth daily.   metoprolol tartrate (LOPRESSOR) 25 MG tablet Take 1 tablet (25 mg total) by mouth 2 (two) times daily.   omeprazole (PRILOSEC) 20 MG capsule Take 1 capsule (20 mg total) by mouth daily.   rosuvastatin (CRESTOR) 10 MG tablet Take 10 mg by mouth at bedtime.   timolol (TIMOPTIC) 0.5 % ophthalmic solution Place 1 drop into both eyes 2 (two) times daily.   Travoprost, BAK Free, (TRAVATAN) 0.004 % SOLN ophthalmic solution Place 1 drop into both eyes  at bedtime.    Allergies:   Aspirin, Lactose intolerance (gi), and Brimonidine   Social History   Tobacco Use   Smoking status: Never   Smokeless tobacco: Never  Vaping Use   Vaping Use: Never used  Substance Use Topics   Alcohol use: No    Alcohol/week: 0.0 standard drinks of alcohol   Drug use: No    Family Hx: The patient's family history includes Heart attack in her brother, father, and mother; Hypertension in her father. There is no history of Anesthesia problems, Hypotension, Malignant hyperthermia, or Pseudochol deficiency.  ROS     Physical Exam:    VS:  BP 128/62   Pulse 68   Ht 4\' 11"  (1.499 m)   Wt 107 lb 9.6 oz (48.8 kg)   SpO2 99%   BMI 21.73 kg/m     Wt Readings from Last 3 Encounters:  08/03/22 107 lb 9.6 oz (48.8 kg)  07/27/22 101 lb (45.8 kg)  05/06/22 101 lb 6.6 oz (46 kg)    Physical Exam  GEN: Thin, in no acute distress  Neck: no JVD, carotid bruits, or masses Cardiac:RRR;S4 2/6 systolic murmur LSB Respiratory:  clear to auscultation bilaterally, normal work of breathing GI: soft, nontender, nondistended, + BS Ext: without cyanosis, clubbing, or edema, Good distal pulses bilaterally Neuro:  Alert and Oriented x 3,  Psych: euthymic mood, full affect        EKGs/Labs/Other Test Reviewed:    EKG:  EKG is ordered today and shows sinus rhythm, questionable left atrial abnormality, no repolarization in this to suggest ischemia.  QTc 393 ms.  Recent Labs: 09/17/2021: BNP 150.5 05/03/2022: ALT 16; BUN 9; Creatinine, Ser 0.62; Hemoglobin 13.8; Platelets 245; Potassium 3.6; Sodium 135   Recent Lipid Panel Recent Labs    09/17/21 1240  CHOL 177  TRIG 59  HDL 88  LDLCALC 78     Prior CV Studies:   NST 09/2021   The study is normal. The study is low risk.   No ST deviation was noted.   Left ventricular function is normal. Nuclear stress EF: 78 %. The left ventricular ejection fraction is hyperdynamic (>65%). End diastolic cavity size is  normal. End systolic cavity size is normal.   Prior study available for comparison. No changes compared to prior study.   IMPRESSIONS Negative for stress induced arrhythmias.  Stress ECG nondiagnostic due to pharmacologic protocol. Normal left ventricular function and size. There is a perfusion defect in the apex in rest and at stress with normal wall motion consistent with artifact.   CONCLUSIONS Negative stress test. Low risk study. No change from prior.  Echocardiogram 02/20/2021    Left ventricular ejection fraction, by estimation, is 60 to 65%. The left ventricle has normal function. The left ventricle has no regional wall motion abnormalities. Left  ventricular diastolic parameters are consistent with Grade I diastolic dysfunction (impaired relaxation). Right ventricular systolic function is mildly reduced. The right ventricular size is normal. There is normal pulmonary artery systolic pressure. The estimated right ventricular systolic pressure is 34.1 mmHg. Left atrial size was mildly dilated. The mitral valve is abnormal. Trivial mitral valve regurgitation. The aortic valve is tricuspid. Aortic valve regurgitation is not visualized. Aortic valve sclerosis/calcification is present, without any evidence of aortic stenosis. The inferior vena cava is normal in size with greater than 50% respiratory variability, suggesting right atrial pressure of 3 mmHg.   Comparison(s): No significant change from prior study. 05/27/17 EF 60-65%. PA pressure .   Risk Assessment/Calculations/Metrics:      ASSESSMENT & PLAN:    1. Coronary artery disease involving native coronary artery of native heart without angina pectoris   2. Hypercholesterolemia   3. Essential hypertension   4. Polymyalgia rheumatica (HCC)    CAD s/p CABG: Asymptomatic, fair functional status.  Risk factors are generally well addressed, even though LDL is not quite as low as desired.  Continue aspirin, statin,  beta-blocker HLP: On rosuvastatin.  LDL is not quite at target, but she has an excellent HDL cholesterol.  She is very lean. HTN: Good control She has a lot of musculoskeletal aches and pains.  I wonder whether she could have polymyalgia rheumatica.  Will check ESR and CRP today.             Dispo:  No follow-ups on file.   Medication Adjustments/Labs and Tests Ordered: Current medicines are reviewed at length with the patient today.  Concerns regarding medicines are outlined above.  Tests Ordered: Orders Placed This Encounter  Procedures   C-reactive protein   Sedimentation rate   Medication Changes: No orders of the defined types were placed in this encounter.  Signed, Thurmon Fair, MD  08/08/2022 5:00 PM    [facility] [address] [place], [place], [place]  [address] Phone: [phone]; Fax: [phone]

## 2022-08-04 LAB — SEDIMENTATION RATE: Sed Rate: 2 mm/hr (ref 0–40)

## 2022-08-04 LAB — C-REACTIVE PROTEIN: CRP: 2 mg/L (ref 0–10)

## 2022-08-08 ENCOUNTER — Encounter: Payer: Self-pay | Admitting: Cardiovascular Disease

## 2022-09-12 ENCOUNTER — Emergency Department (HOSPITAL_BASED_OUTPATIENT_CLINIC_OR_DEPARTMENT_OTHER)
Admission: EM | Admit: 2022-09-12 | Discharge: 2022-09-13 | Disposition: A | Discharge status: Home / Self Care | Payer: 59 | Attending: Emergency Medicine | Admitting: Emergency Medicine

## 2022-09-12 ENCOUNTER — Other Ambulatory Visit: Payer: Self-pay

## 2022-09-12 DIAGNOSIS — R1084 Generalized abdominal pain: Secondary | ICD-10-CM | POA: Insufficient documentation

## 2022-09-12 DIAGNOSIS — Z7982 Long term (current) use of aspirin: Secondary | ICD-10-CM | POA: Insufficient documentation

## 2022-09-12 NOTE — ED Provider Notes (Signed)
[place] [facility]  Provider Note  CSN: [id number] Arrival date & time: 09/12/22 2309  History Chief Complaint  Patient presents with   Abdominal Pain    Theresa Crosby is a 89 y.o. female with history of constipation has had intermittent abdominal pain for the last several months, worsened since yesterday. She reports pain is in her epigastric area, radiates around to LLQ. She states when she eats, she feels like the food gets 'hung up' and that is why she gets constipated. She has been drinking 'Smooth Move Tea' and that has helped her constipation, she reports soft brown stools today but has had to strain to have BM some recently. No vomiting, no fever no hematochezia or melena.    Home Medications Prior to Admission medications   Medication Sig Start Date End Date Taking? Authorizing Provider  acetaminophen (TYLENOL) 325 MG tablet     [provider]  amLODipine (NORVASC) 10 MG tablet Take 10 tablets by mouth daily. 03/21/20   [provider]  aspirin EC 81 MG tablet Take 1 tablet (81 mg total) by mouth daily. 11/02/11   Laveda Norman, MD  Calcium-Vitamin D-Vitamin K (CALCIUM SOFT CHEWS PO) Take by mouth daily.    [provider]  Cholecalciferol (VITAMIN D3) 1000 UNITS CAPS Take 2,000 Units by mouth every morning.      [provider]  diclofenac Sodium (VOLTAREN) 1 % GEL Apply 1 Application topically 2 (two) times daily as needed. 11/03/21   [provider]  lactulose (CEPHULAC) 10 g packet Take 1 packet (10 g total) by mouth daily. 05/06/22   Curatolo, Adam, DO  loratadine (CLARITIN) 10 MG tablet Take 10 mg by mouth daily as needed for allergies.    [provider]  magnesium oxide (MAG-OX) 400 MG tablet Take 400 mg by mouth daily.    [provider]  metoprolol tartrate (LOPRESSOR) 25 MG tablet Take 1 tablet (25 mg total) by mouth 2 (two) times daily. 06/26/19   Kroeger, Ovidio Kin.,  PA-C  omeprazole (PRILOSEC) 20 MG capsule Take 1 capsule (20 mg total) by mouth daily. 11/22/20   Gwyneth Sprout, MD  rosuvastatin (CRESTOR) 10 MG tablet Take 10 mg by mouth at bedtime. 07/13/21   [provider]  timolol (TIMOPTIC) 0.5 % ophthalmic solution Place 1 drop into both eyes 2 (two) times daily. 09/12/21   [provider]  Travoprost, BAK Free, (TRAVATAN) 0.004 % SOLN ophthalmic solution Place 1 drop into both eyes at bedtime. 07/30/21   [provider]     Allergies    Aspirin, Lactose intolerance (gi), and Brimonidine   Review of Systems   Review of Systems Please see HPI for pertinent positives and negatives  Physical Exam BP 137/76   Pulse 87   Temp 97.7 F (36.5 C) (Oral)   Resp 18   Ht 4\' 11"  (1.499 m)   Wt 45.8 kg   SpO2 100%   BMI 20.40 kg/m   Physical Exam Vitals and nursing note reviewed.  Constitutional:      Appearance: Normal appearance.  HENT:     Head: Normocephalic and atraumatic.     Nose: Nose normal.     Mouth/Throat:     Mouth: Mucous membranes are moist.  Eyes:     Extraocular Movements: Extraocular movements intact.     Conjunctiva/sclera: Conjunctivae normal.  Cardiovascular:     Rate and Rhythm: Normal rate.  Pulmonary:  Effort: Pulmonary effort is normal.     Breath sounds: Normal breath sounds.  Abdominal:     General: Abdomen is flat.     Palpations: Abdomen is soft.     Tenderness: There is generalized abdominal tenderness. There is no guarding. Negative signs include Murphy's sign and McBurney's sign.  Musculoskeletal:        General: No swelling. Normal range of motion.     Cervical back: Neck supple.  Skin:    General: Skin is warm and dry.  Neurological:     General: No focal deficit present.     Mental Status: She is alert.  Psychiatric:        Mood and Affect: Mood normal.     ED Results / Procedures / Treatments   EKG EKG Interpretation Date/Time:  Saturday September 12 2022 23:22:17  EDT Ventricular Rate:  85 PR Interval:  198 QRS Duration:  70 QT Interval:  407 QTC Calculation: 484 R Axis:   68  Text Interpretation: Sinus rhythm Abnormal R-wave progression, early transition No significant change since last tracing Confirmed by Susy Frizzle [phone]) on 09/12/2022 11:28:47 PM  Procedures Procedures  Medications Ordered in the ED Medications  iohexol (OMNIPAQUE) 9 MG/ML oral solution 500 mL (500 mLs Oral Contrast Given 09/13/22 0120)  iohexol (OMNIPAQUE) 300 MG/ML solution 100 mL (100 mLs Intravenous Contrast Given 09/13/22 0240)    Initial Impression and Plan  Patient here with several months of persistent diffuse abdominal pain. Starts epigastric and moves around to LLQ. She had an ED evaluation for similar in March x 2. Scheduled to see GI next month. She has diffuse tenderness, but no peritoneal signs. She has history of CABG, doubt this is cardiac but given epigastric pain, will check EKG and Trop as well as belly labs and CT.   ED Course   Clinical Course as of 09/13/22 0508  Wynelle Link Sep 13, 2022  0029 CBC is normal.  [CS]  0044 Trop is negative.  [CS]  0224 Chemistry machine here is off-line. Blood work sent to [facility] for analysis, I-stat chem done here to clear for CT.  [CS]  0226 Repeat trop unchanged.  [CS]  [id number] I personally viewed the images from radiology studies and agree with radiologist interpretation: CT neg for acute cause of pain. With RN at bedside, rectal exam done and is neg for palpable mass. She has had several bowel movements since arrival. Still awaiting CMP and Lipase.  [CS]  0507 CMP and lipase are unremarkable. Recommend she continue with bowel regimen at home. Follow up with GI as scheduled in a few weeks. PCP follow up, RTED for any other concerns.   [CS]    Clinical Course User Index [CS] Pollyann Savoy, MD     MDM Rules/Calculators/A&P Medical Decision Making Problems Addressed: Generalized abdominal pain: chronic  illness or injury with exacerbation, progression, or side effects of treatment  Amount and/or Complexity of Data Reviewed Labs: ordered. Decision-making details documented in ED Course. Radiology: ordered and independent interpretation performed. Decision-making details documented in ED Course.  Risk Prescription drug management.     Final Clinical Impression(s) / ED Diagnoses Final diagnoses:  Generalized abdominal pain    Rx / DC Orders ED Discharge Orders     None        Pollyann Savoy, MD 09/13/22 [phone]

## 2022-09-12 NOTE — ED Triage Notes (Signed)
Pt reports to the ED with complaints of her bowels giving her problems. States that she is having pain mid-sternum and then it radiates down under her left breast to her stomach. States that she has had some bowel movements but has to help it come out.

## 2022-09-13 ENCOUNTER — Emergency Department (HOSPITAL_BASED_OUTPATIENT_CLINIC_OR_DEPARTMENT_OTHER): Payer: 59

## 2022-09-13 DIAGNOSIS — R1084 Generalized abdominal pain: Secondary | ICD-10-CM | POA: Diagnosis not present

## 2022-09-13 LAB — COMPREHENSIVE METABOLIC PANEL WITH GFR
ALT: 18 U/L (ref 0–44)
AST: 24 U/L (ref 15–41)
Albumin: 4.7 g/dL (ref 3.5–5.0)
Alkaline Phosphatase: 86 U/L (ref 38–126)
Anion gap: 9 (ref 5–15)
BUN: 10 mg/dL (ref 8–23)
CO2: 29 mmol/L (ref 22–32)
Calcium: 10.1 mg/dL (ref 8.9–10.3)
Chloride: 101 mmol/L (ref 98–111)
Creatinine, Ser: 0.57 mg/dL (ref 0.44–1.00)
GFR, Estimated: >60 mL/min (ref >60)
Glucose, Bld: 84 mg/dL (ref 70–99)
Potassium: 3.9 mmol/L (ref 3.5–5.1)
Sodium: 139 mmol/L (ref 135–145)
Total Bilirubin: 0.4 mg/dL (ref 0.3–1.2)
Total Protein: 7.5 g/dL (ref 6.5–8.1)

## 2022-09-13 LAB — CBC WITH DIFFERENTIAL/PLATELET
Abs Immature Granulocytes: 0.01 10*3/uL (ref 0.00–0.07)
Basophils Absolute: 0 10*3/uL (ref 0.0–0.1)
Basophils Relative: 1 %
Eosinophils Absolute: 0.2 10*3/uL (ref 0.0–0.5)
Eosinophils Relative: 3 %
HCT: 39.9 % (ref 36.0–46.0)
Hemoglobin: 13.3 g/dL (ref 12.0–15.0)
Immature Granulocytes: 0 %
Lymphocytes Relative: 47 %
Lymphs Abs: 2.8 10*3/uL (ref 0.7–4.0)
MCH: 29.9 pg (ref 26.0–34.0)
MCHC: 33.3 g/dL (ref 30.0–36.0)
MCV: 89.7 fL (ref 80.0–100.0)
Monocytes Absolute: 0.6 10*3/uL (ref 0.1–1.0)
Monocytes Relative: 10 %
Neutro Abs: 2.3 10*3/uL (ref 1.7–7.7)
Neutrophils Relative %: 39 %
Platelets: 234 10*3/uL (ref 150–400)
RBC: 4.45 MIL/uL (ref 3.87–5.11)
RDW: 14.7 % (ref 11.5–15.5)
WBC: 5.8 10*3/uL (ref 4.0–10.5)
nRBC: 0 % (ref 0.0–0.2)

## 2022-09-13 LAB — LIPASE, BLOOD: Lipase: 61 U/L — ABNORMAL HIGH (ref 11–51)

## 2022-09-13 LAB — I-STAT CHEM 8, ED
BUN: 7 mg/dL — ABNORMAL LOW (ref 8–23)
Calcium, Ion: 1.05 mmol/L — ABNORMAL LOW (ref 1.15–1.40)
Chloride: 102 mmol/L (ref 98–111)
Creatinine, Ser: 0.5 mg/dL (ref 0.44–1.00)
Glucose, Bld: 94 mg/dL (ref 70–99)
HCT: 42 % (ref 36.0–46.0)
Hemoglobin: 14.3 g/dL (ref 12.0–15.0)
Potassium: 3.4 mmol/L — ABNORMAL LOW (ref 3.5–5.1)
Sodium: 136 mmol/L (ref 135–145)
TCO2: 22 mmol/L (ref 22–32)

## 2022-09-13 LAB — TROPONIN I (HIGH SENSITIVITY)
Troponin I (High Sensitivity): 8 ng/L (ref <18)
Troponin I (High Sensitivity): 8 ng/L (ref <18)

## 2022-09-13 MED ORDER — IOHEXOL 300 MG/ML  SOLN
100.0000 mL | Freq: Once | INTRAMUSCULAR | Status: AC | PRN
  Administered 2022-09-13: 100 mL via INTRAVENOUS

## 2022-09-13 MED ORDER — IOHEXOL 9 MG/ML PO SOLN
500.0000 mL | ORAL | Status: AC
  Administered 2022-09-13 (×2): 500 mL via ORAL

## 2022-10-12 ENCOUNTER — Telehealth: Payer: Self-pay | Admitting: Cardiovascular Disease

## 2022-10-12 NOTE — Telephone Encounter (Signed)
Returned pt call. Pt is concerned about her organs and would like to know if she can stop taking her Metoprolol since she has been on it for 11 years. Please advise.

## 2022-10-12 NOTE — Telephone Encounter (Signed)
Patient is requesting a call back from the nurse. 

## 2022-10-14 NOTE — Telephone Encounter (Signed)
Returned pt call in regards to Dr. Blanchie Dessert recommendation. Pt verbalized understanding to stay on her medication.   Dr. Royann Shivers is out of town this week - I'm covering. I'm not sure what her direct organ concern is, but I would advise she remain on the metoprolol and discuss it with Dr. Salena Saner when he returns next week. Dr. Rennis Golden

## 2022-10-21 ENCOUNTER — Other Ambulatory Visit (HOSPITAL_COMMUNITY): Payer: Self-pay | Admitting: Gastroenterology

## 2022-10-21 DIAGNOSIS — R1319 Other dysphagia: Secondary | ICD-10-CM

## 2022-11-02 ENCOUNTER — Ambulatory Visit (HOSPITAL_COMMUNITY)
Admission: RE | Admit: 2022-11-02 | Discharge: 2022-11-02 | Disposition: A | Discharge status: Home / Self Care | Payer: 59 | Source: Ambulatory Visit | Attending: Gastroenterology | Admitting: Gastroenterology

## 2022-11-02 ENCOUNTER — Other Ambulatory Visit (HOSPITAL_COMMUNITY): Payer: Self-pay | Admitting: Gastroenterology

## 2022-11-02 DIAGNOSIS — R1319 Other dysphagia: Secondary | ICD-10-CM

## 2022-12-26 ENCOUNTER — Emergency Department (HOSPITAL_BASED_OUTPATIENT_CLINIC_OR_DEPARTMENT_OTHER)
Admission: EM | Admit: 2022-12-26 | Discharge: 2022-12-26 | Disposition: A | Discharge status: Home / Self Care | Payer: 59 | Attending: Emergency Medicine | Admitting: Emergency Medicine

## 2022-12-26 ENCOUNTER — Emergency Department (HOSPITAL_BASED_OUTPATIENT_CLINIC_OR_DEPARTMENT_OTHER): Payer: 59

## 2022-12-26 ENCOUNTER — Other Ambulatory Visit: Payer: Self-pay

## 2022-12-26 ENCOUNTER — Encounter (HOSPITAL_BASED_OUTPATIENT_CLINIC_OR_DEPARTMENT_OTHER): Payer: Self-pay | Admitting: Emergency Medicine

## 2022-12-26 DIAGNOSIS — Z20822 Contact with and (suspected) exposure to covid-19: Secondary | ICD-10-CM | POA: Diagnosis not present

## 2022-12-26 DIAGNOSIS — Z7982 Long term (current) use of aspirin: Secondary | ICD-10-CM | POA: Diagnosis not present

## 2022-12-26 DIAGNOSIS — Z79899 Other long term (current) drug therapy: Secondary | ICD-10-CM | POA: Insufficient documentation

## 2022-12-26 DIAGNOSIS — I110 Hypertensive heart disease with heart failure: Secondary | ICD-10-CM | POA: Diagnosis not present

## 2022-12-26 DIAGNOSIS — F419 Anxiety disorder, unspecified: Secondary | ICD-10-CM | POA: Diagnosis present

## 2022-12-26 DIAGNOSIS — I509 Heart failure, unspecified: Secondary | ICD-10-CM | POA: Insufficient documentation

## 2022-12-26 DIAGNOSIS — I2510 Atherosclerotic heart disease of native coronary artery without angina pectoris: Secondary | ICD-10-CM | POA: Diagnosis not present

## 2022-12-26 DIAGNOSIS — R0602 Shortness of breath: Secondary | ICD-10-CM | POA: Diagnosis not present

## 2022-12-26 LAB — CBC WITH DIFFERENTIAL/PLATELET
Abs Immature Granulocytes: 0.01 10*3/uL (ref 0.00–0.07)
Basophils Absolute: 0 10*3/uL (ref 0.0–0.1)
Basophils Relative: 1 %
Eosinophils Absolute: 0.3 10*3/uL (ref 0.0–0.5)
Eosinophils Relative: 6 %
HCT: 38 % (ref 36.0–46.0)
Hemoglobin: 12.7 g/dL (ref 12.0–15.0)
Immature Granulocytes: 0 %
Lymphocytes Relative: 35 %
Lymphs Abs: 1.7 10*3/uL (ref 0.7–4.0)
MCH: 29.8 pg (ref 26.0–34.0)
MCHC: 33.4 g/dL (ref 30.0–36.0)
MCV: 89.2 fL (ref 80.0–100.0)
Monocytes Absolute: 0.6 10*3/uL (ref 0.1–1.0)
Monocytes Relative: 12 %
Neutro Abs: 2.2 10*3/uL (ref 1.7–7.7)
Neutrophils Relative %: 46 %
Platelets: 211 10*3/uL (ref 150–400)
RBC: 4.26 MIL/uL (ref 3.87–5.11)
RDW: 15.5 % (ref 11.5–15.5)
WBC: 4.8 10*3/uL (ref 4.0–10.5)
nRBC: 0 % (ref 0.0–0.2)

## 2022-12-26 LAB — RESP PANEL BY RT-PCR (RSV, FLU A&B, COVID)  RVPGX2
Influenza A by PCR: NEGATIVE
Influenza B by PCR: NEGATIVE
Resp Syncytial Virus by PCR: NEGATIVE
SARS Coronavirus 2 by RT PCR: NEGATIVE

## 2022-12-26 LAB — COMPREHENSIVE METABOLIC PANEL
ALT: 18 U/L (ref 0–44)
AST: 28 U/L (ref 15–41)
Albumin: 4.6 g/dL (ref 3.5–5.0)
Alkaline Phosphatase: 90 U/L (ref 38–126)
Anion gap: 14 (ref 5–15)
BUN: 8 mg/dL (ref 8–23)
CO2: 25 mmol/L (ref 22–32)
Calcium: 9.4 mg/dL (ref 8.9–10.3)
Chloride: 98 mmol/L (ref 98–111)
Creatinine, Ser: 0.55 mg/dL (ref 0.44–1.00)
GFR, Estimated: >60 mL/min (ref >60)
Glucose, Bld: 88 mg/dL (ref 70–99)
Potassium: 3.6 mmol/L (ref 3.5–5.1)
Sodium: 137 mmol/L (ref 135–145)
Total Bilirubin: 0.7 mg/dL (ref <1.2)
Total Protein: 7.4 g/dL (ref 6.5–8.1)

## 2022-12-26 LAB — TROPONIN I (HIGH SENSITIVITY): Troponin I (High Sensitivity): 8 ng/L (ref <18)

## 2022-12-26 LAB — BRAIN NATRIURETIC PEPTIDE: B Natriuretic Peptide: 186.1 pg/mL — ABNORMAL HIGH (ref 0.0–100.0)

## 2022-12-26 MED ORDER — LORAZEPAM 1 MG PO TABS
0.5000 mg | ORAL_TABLET | Freq: Three times a day (TID) | ORAL | 0 refills | Status: AC | PRN
Start: 2022-12-26 — End: 2022-12-31

## 2022-12-26 MED ORDER — LORAZEPAM 1 MG PO TABS
0.5000 mg | ORAL_TABLET | Freq: Once | ORAL | Status: AC
  Administered 2022-12-26: 0.5 mg via ORAL
  Filled 2022-12-26: qty 1

## 2022-12-26 NOTE — ED Triage Notes (Signed)
Pt c/o SHOB and cough x 2 wks

## 2022-12-26 NOTE — Discharge Instructions (Signed)
Please follow-up with a cardiologist in regards to recent symptoms and ER visit.  Today your labs and imaging are reassuring and you improved with medication.  As we discussed I will give you a few days worth of Ativan to help with your anxiety as this is most likely contributing to your symptoms however you will need to follow-up with your cardiologist within the next week to be reevaluated.  If symptoms change or worsen please return to ER.

## 2022-12-26 NOTE — ED Notes (Signed)
Pt was provided apple juice and peanut butter crackers per Pt request.

## 2022-12-26 NOTE — ED Provider Notes (Signed)
[facility] Provider Note   CSN: [id number] Arrival date & time: 12/26/22  1436     History  Chief Complaint  Patient presents with   Shortness of Breath    Theresa Crosby is a 89 y.o. female history of CAD status post CABG x 4, hypertension, CHF presented with shortness of breath for the past 2 weeks.  Patient states that she is currently under a lot of stress as her grandchild is trying to get her moved to [place] and notices that she becomes very anxious and short of breath when she thinks about this.  Patient does endorse some chest pain with this shortness of breath but states that this only last for few seconds.  Patient states she does have history of anxiety and does not take any medications for this.  Chest pain does not radiate and seems to be related to the shortness of breath.  Patient denies any leg swelling, hemoptysis, productive cough, fevers, sick contacts, presents to the cancer.  Patient is unsure what makes his symptoms better but notes that when she thinks about all of her life stressors the shortness of breath gets worse.  Home Medications Prior to Admission medications   Medication Sig Start Date End Date Taking? Authorizing Provider  LORazepam (ATIVAN) 1 MG tablet Take 0.5 tablets (0.5 mg total) by mouth 3 (three) times daily as needed for up to 5 days for anxiety. 12/26/22 12/31/22 Yes Suheily Birks, Beverly Gust, PA-C  acetaminophen (TYLENOL) 325 MG tablet     [provider]  amLODipine (NORVASC) 10 MG tablet Take 10 tablets by mouth daily. 03/21/20   [provider]  aspirin EC 81 MG tablet Take 1 tablet (81 mg total) by mouth daily. 11/02/11   Laveda Norman, MD  Calcium-Vitamin D-Vitamin K (CALCIUM SOFT CHEWS PO) Take by mouth daily.    [provider]  Cholecalciferol (VITAMIN D3) 1000 UNITS CAPS Take 2,000 Units by mouth every morning.      [provider]  diclofenac Sodium (VOLTAREN) 1  % GEL Apply 1 Application topically 2 (two) times daily as needed. 11/03/21   [provider]  lactulose (CEPHULAC) 10 g packet Take 1 packet (10 g total) by mouth daily. 05/06/22   Curatolo, Adam, DO  loratadine (CLARITIN) 10 MG tablet Take 10 mg by mouth daily as needed for allergies.    [provider]  magnesium oxide (MAG-OX) 400 MG tablet Take 400 mg by mouth daily.    [provider]  metoprolol tartrate (LOPRESSOR) 25 MG tablet Take 1 tablet (25 mg total) by mouth 2 (two) times daily. 06/26/19   Kroeger, Ovidio Kin., PA-C  omeprazole (PRILOSEC) 20 MG capsule Take 1 capsule (20 mg total) by mouth daily. 11/22/20   Gwyneth Sprout, MD  rosuvastatin (CRESTOR) 10 MG tablet Take 10 mg by mouth at bedtime. 07/13/21   [provider]  timolol (TIMOPTIC) 0.5 % ophthalmic solution Place 1 drop into both eyes 2 (two) times daily. 09/12/21   [provider]  Travoprost, BAK Free, (TRAVATAN) 0.004 % SOLN ophthalmic solution Place 1 drop into both eyes at bedtime. 07/30/21   [provider]      Allergies    Aspirin, Lactose intolerance (gi), and Brimonidine    Review of Systems   Review of Systems  Respiratory:  Positive for shortness of breath.     Physical Exam Updated Vital Signs BP (!) 141/92 (BP Location: Right Arm)  Pulse 76   Temp 98 F (36.7 C) (Oral)   Resp (!) 22   Ht 4\' 11"  (1.499 m)   Wt 44.9 kg   SpO2 98%   BMI 20.00 kg/m  Physical Exam Vitals reviewed.  Constitutional:      General: She is not in acute distress.    Comments: Tearful on exam  HENT:     Head: Normocephalic and atraumatic.  Eyes:     Extraocular Movements: Extraocular movements intact.     Conjunctiva/sclera: Conjunctivae normal.     Pupils: Pupils are equal, round, and reactive to light.  Cardiovascular:     Rate and Rhythm: Normal rate and regular rhythm.     Pulses: Normal pulses.     Heart sounds: Normal heart sounds.     Comments: 2+ bilateral  radial/dorsalis pedis pulses with regular rate Pulmonary:     Effort: Pulmonary effort is normal. No respiratory distress.     Breath sounds: Normal breath sounds.     Comments: Able to speak in full sentences Abdominal:     Palpations: Abdomen is soft.     Tenderness: There is no abdominal tenderness. There is no guarding or rebound.  Musculoskeletal:        General: Normal range of motion.     Cervical back: Normal range of motion and neck supple.     Right lower leg: No tenderness. No edema.     Left lower leg: No tenderness. No edema.     Comments: 5 out of 5 bilateral grip/leg extension strength  Skin:    General: Skin is warm and dry.     Capillary Refill: Capillary refill takes less than 2 seconds.  Neurological:     General: No focal deficit present.     Mental Status: She is alert and oriented to person, place, and time.     Comments: Sensation intact in all 4 limbs  Psychiatric:        Mood and Affect: Mood normal.     ED Results / Procedures / Treatments   Labs (all labs ordered are listed, but only abnormal results are displayed) Labs Reviewed  BRAIN NATRIURETIC PEPTIDE - Abnormal; Notable for the following components:      Result Value   B Natriuretic Peptide 186.1 (*)    All other components within normal limits  RESP PANEL BY RT-PCR (RSV, FLU A&B, COVID)  [id number]  COMPREHENSIVE METABOLIC PANEL  CBC WITH DIFFERENTIAL/PLATELET  TROPONIN I (HIGH SENSITIVITY)    EKG EKG Interpretation Date/Time:  Saturday December 26 2022 14:52:44 EST Ventricular Rate:  83 PR Interval:  169 QRS Duration:  79 QT Interval:  382 QTC Calculation: 449 R Axis:   73  Text Interpretation: Sinus rhythm Abnormal R-wave progression, early transition Confirmed by Glyn Ade [phone]) on 12/26/2022 5:00:19 PM  Radiology DG Chest 2 View  Result Date: 12/26/2022 CLINICAL DATA:  Cough.  Shortness of breath. EXAM: CHEST - 2 VIEW COMPARISON:  Chest radiograph and CT chest dated  May 03, 2022. FINDINGS: The heart size and mediastinal contours are within normal limits. Aortic atherosclerosis. Prior median sternotomy. No focal consolidation, pneumothorax, or pleural effusion. No acute osseous abnormality. IMPRESSION: No acute cardiopulmonary findings. Electronically Signed   By: Hart Robinsons M.D.   On: 12/26/2022 16:16    Procedures Procedures    Medications Ordered in ED Medications  LORazepam (ATIVAN) tablet 0.5 mg (0.5 mg Oral Given 12/26/22 1552)    ED Course/ Medical Decision Making/ A&P  Medical Decision Making Amount and/or Complexity of Data Reviewed Labs: ordered. Radiology: ordered.  Risk Prescription drug management.   Theresa Crosby 89 y.o. presented today for shortness of breath.  Working DDx that I considered at this time includes, but not limited to, anxiety, asthma/COPD exacerbation, URI, viral illness, anemia, ACS, PE, pneumonia, pleural effusion, lung cancer.  R/o DDx: asthma/COPD exacerbation, URI, viral illness, anemia, ACS, PE, pneumonia, pleural effusion, lung cancer: These are considered less likely due to history of present illness, physical exam, labs/imaging findings  Review of prior external notes: 11/23/2022 office visit  Unique Tests and My Interpretation:  CBC: Unremarkable CMP: Unremarkable BNP: 186.1 EKG: Sinus 83 bpm, no ST elevations or depressions noted, no blocks noted Troponin: 8 CXR: No acute cardiopulmonary changes Respiratory Panel: Negative  Social Determinants of Health: none  Discussion with Independent Historian: None  Discussion of Management of Tests: None  Risk: Medium: prescription drug management  Risk Stratification Score: None  Staffed with [place], MD  Plan: On exam patient was in no acute distress with stable vitals.  On exam patient is tearful but otherwise had reassuring physical exam.  Labs ordered along with chest x-ray.  Patient will need 1  troponin as this been going on for 2 weeks.  Patient does state that she has history of anxiety and notices the symptoms after thinking about all of her life stressors which does raise my suspicion for possible panic attacks causing patient's symptoms however given patient's age and risk factors will obtain labs and imaging to rule out other life-threatening causes for patient's symptoms.  Patient was prescribed Ativan earlier this year however finished and is not taking any medications for her anxiety and so we will give 0.5 mg of Ativan here.  At this time patient only needs 1 troponin due to chronicity of symptoms being 2 weeks and so the first troponin should be elevated. Also patient had a normal nuclear study last year that was negative for any concerning pathologies.  All in all if patient's labs and troponin are negative and she improves with the Ativan I have very low suspicion of ACS and believe patient can be discharged with close follow-up with her cardiologist.  Still waiting on the rest of patient's labs however troponin is 8 and so will not get a second 1 as this does appear to be her baseline and her history is nonconcerning for ACS.  The rest patient's labs and imaging were all reassuring.  On recheck patient stated that she felt much better after getting the Ativan and that the chest pain shortness of breath went away.  Had a high suspicion patient's chest pain shortness of breath is related to her anxiety given her history, reassuring labs and physical exam and improvement with medication.  Will give a few days worth of Ativan at patient's request but strongly recommended that she follows up with her cardiologist to be reevaluated within the next week given her cardiac history.  Patient denies discussed and we agreed patient did not need admission or further workup at this time and that patient is safe to be discharged.  Patient was given return precautions. Patient stable for discharge at  this time.  Patient verbalized understanding of plan.  This chart was dictated using voice recognition software.  Despite best efforts to proofread,  errors can occur which can change the documentation meaning.         Final Clinical Impression(s) / ED Diagnoses Final diagnoses:  Anxiety  Rx / DC Orders ED Discharge Orders          Ordered    LORazepam (ATIVAN) 1 MG tablet  3 times daily PRN        12/26/22 1830              Remi Deter 12/26/22 1833    Glyn Ade, MD 12/27/22 1457

## 2022-12-28 ENCOUNTER — Telehealth: Payer: Self-pay | Admitting: Nurse Practitioner

## 2022-12-28 NOTE — Telephone Encounter (Signed)
Patient identification verified by 2 forms. Marilynn Rail, RN    Called and spoke to patient  Patient states:   -SOB on going for 2 weeks   -caught a cold, cough, wheezing   -presented to urgent care Saturday   -urgent care advised her to follow up with cardiologist   -mostly has trouble breathing while at rest and moving around   -has appointment tomorrow   -at this time feels fine, no issues or concerns  Patient denies:   -chest pain   -SOB/difficulty breathing  Advised patient to keep 11/12 appointment for follow up  Reviewed strict ED warning signs/precautions  Patient verbalized understanding, no questions or concerns at this time

## 2022-12-28 NOTE — Telephone Encounter (Signed)
Pt c/o Shortness Of Breath: STAT if SOB developed within the last 24 hours or pt is noticeably SOB on the phone  1. Are you currently SOB (can you hear that pt is SOB on the phone)?   2. How long have you been experiencing SOB? 2 weeks  3. Are you SOB when sitting or when up moving around?  When she moves around  4. Are you currently experiencing any other symptoms?  Over anxious, can not sleep and no appetite, coughing a lot- patient went to the ER on 12-26-22- she wanted to be seen- made her appointment with Irving Burton for tomorrow(12-29-22)- please call to evaluate

## 2022-12-29 ENCOUNTER — Ambulatory Visit: Payer: 59 | Attending: Nurse Practitioner | Admitting: Nurse Practitioner

## 2022-12-29 ENCOUNTER — Encounter: Payer: Self-pay | Admitting: Nurse Practitioner

## 2022-12-29 VITALS — BP 142/78 | HR 82 | Ht 60.0 in | Wt 99.0 lb

## 2022-12-29 DIAGNOSIS — I2510 Atherosclerotic heart disease of native coronary artery without angina pectoris: Secondary | ICD-10-CM | POA: Diagnosis not present

## 2022-12-29 DIAGNOSIS — E785 Hyperlipidemia, unspecified: Secondary | ICD-10-CM | POA: Diagnosis not present

## 2022-12-29 DIAGNOSIS — I10 Essential (primary) hypertension: Secondary | ICD-10-CM | POA: Diagnosis not present

## 2022-12-29 DIAGNOSIS — R0602 Shortness of breath: Secondary | ICD-10-CM | POA: Diagnosis not present

## 2022-12-29 NOTE — Patient Instructions (Signed)
Medication Instructions:  Your physician recommends that you continue on your current medications as directed. Please refer to the Current Medication list given to you today.  *If you need a refill on your cardiac medications before your next appointment, please call your pharmacy*   Lab Work: NONE ordered at this time of appointment   Testing/Procedures: NONE ordered at this time of appointment   Follow-Up: At [facility], you and your health needs are our priority.  As part of our continuing mission to provide you with exceptional heart care, we have created designated Provider Care Teams.  These Care Teams include your primary Cardiologist (physician) and Advanced Practice Providers (APPs -  Physician Assistants and Nurse Practitioners) who all work together to provide you with the care you need, when you need it.  We recommend signing up for the patient portal called "MyChart".  Sign up information is provided on this After Visit Summary.  MyChart is used to connect with patients for Virtual Visits (Telemedicine).  Patients are able to view lab/test results, encounter notes, upcoming appointments, etc.  Non-urgent messages can be sent to your provider as well.   To learn more about what you can do with MyChart, go to [email or web address].    Your next appointment:   6 week(s)  Provider:   Bernadene Person, NP        Other Instructions

## 2022-12-29 NOTE — Progress Notes (Signed)
Office Visit    Patient Name: Theresa Crosby Date of Encounter: 12/29/2022  Primary Care Provider:  Jonna Clark, MD Primary Cardiologist:  Thurmon Fair, MD  Chief Complaint    89 year old female with a history of CAD s/p CABG x 4 (LIMA-LAD, SVG-OM2, SVG-diagonal, SVG-RCA) in 2013, hypertension, and hyperlipidemia who presents for follow-up related to CAD and shortness of breath.  Past Medical History    Past Medical History:  Diagnosis Date   Abnormal nuclear stress test 10/05/2011   Anxiety    2005- related to care of aging parent    Arthritis    degeneration - spondylosis, OA - all over body,    Blood transfusion    postop- back surg. 1987   Bunion    CAD (coronary artery disease)significant diseas w/ 99% LAD, 80% D1, 80% LCX 10/05/2011   GERD (gastroesophageal reflux disease)    Hyperlipemia    Hypertension    ireg. heartbeat, no referral made to cardiologist , sees [facility].    Neuromuscular disorder (HCC)    burning- L leg , numbing of toes    S/P CABG x 4, LIMA-LAD, SVG-OM2, SVG-diag, SVG-RCA 10/05/2011   Shortness of breath    Past Surgical History:  Procedure Laterality Date   ABDOMINAL HYSTERECTOMY     partial hysterectomy- 1972   ANTERIOR APPROACH HEMI HIP ARTHROPLASTY Left 06/26/2016   Procedure: ANTERIOR APPROACH TOTAL HIP ARTHROPLASTY;  Surgeon: Tarry Kos, MD;  Location: [facility] OR;  Service: Orthopedics;  Laterality: Left;   BACK SURGERY     1987-    BREAST CYST ASPIRATION Left 06/18/2010   CARDIAC CATHETERIZATION  10/12/2011   high-grade ca+ prox. LAD diagonal branch,higly diseased diagonal branch & obtuse marginal branch   CORONARY ARTERY BYPASS GRAFT  10/04/2011   Procedure: CORONARY ARTERY BYPASS GRAFTING (CABG);  Surgeon: Alleen Borne, MD;  Location[facility] OR;  Service: Open Heart Surgery;  Laterality: N/A;  CABG x four using left internal mammary artery and right leg greater saphenous vein    EYE SURGERY     cataracts removed-  bilateral, no IOL   FOOT SURGERY Right January 2013   HT 5   LEFT HEART CATHETERIZATION WITH CORONARY ANGIOGRAM N/A 10/02/2011   Procedure: LEFT HEART CATHETERIZATION WITH CORONARY ANGIOGRAM;  Surgeon: Runell Gess, MD;  Location: [facility] CATH LAB;  Service: Cardiovascular;  Laterality: N/A;   LUMBAR LAMINECTOMY/DECOMPRESSION MICRODISCECTOMY  03/04/2011   Procedure: LUMBAR LAMINECTOMY/DECOMPRESSION MICRODISCECTOMY;  Surgeon: Hewitt Shorts, MD;  Location: [facility] NEURO ORS;  Service: Neurosurgery;  Laterality: N/A;  Thoracic Ten-Thoracic Twelve Thoracic Laminectomy   NM MYOVIEW LTD  09/15/2011   mild ischemia mid anterior & apical anterior region.EF: 86%   TOE SURGERY     R foot- removed calus between 5-4   TUBAL LIGATION      Allergies  Allergies  Allergen Reactions   Aspirin Other (See Comments)    GI upset,high doses   Lactose Intolerance (Gi) Other (See Comments)    unspecified   Brimonidine Other (See Comments)    Redness-OU     Labs/Other Studies Reviewed    The following studies were reviewed today:  Cardiac Studies & Procedures     STRESS TESTS  MYOCARDIAL PERFUSION IMAGING 09/22/2021  Narrative   The study is normal. The study is low risk.   No ST deviation was noted.   Left ventricular function is normal. Nuclear stress EF: 78 %. The left ventricular ejection fraction is hyperdynamic (>65%). End diastolic  cavity size is normal. End systolic cavity size is normal.   Prior study available for comparison. No changes compared to prior study.  IMPRESSIONS Negative for stress induced arrhythmias. Stress ECG nondiagnostic due to pharmacologic protocol. Normal left ventricular function and size. There is a perfusion defect in the apex in rest and at stress with normal wall motion consistent with artifact.  CONCLUSIONS Negative stress test. Low risk study. No change from prior.   ECHOCARDIOGRAM  ECHOCARDIOGRAM COMPLETE 02/20/2021  Narrative ECHOCARDIOGRAM  REPORT    Patient Name:   Theresa Crosby Date of Exam: 02/20/2021 Medical Rec #:  [id number]         Height:       59.0 in Accession #:    [id number]        Weight:       103.6 lb Date of Birth:  [birth year]         BSA:          1.395 m Patient Age:    88 years          BP:           116/68 mmHg Patient Gender: F                 HR:           67 bpm. Exam Location:  [address]  Procedure: 2D Echo, 3D Echo, Cardiac Doppler, Color Doppler and Strain Analysis  Indications:    R06.02 Shortness of breath  History:        Patient has prior history of Echocardiogram examinations, most recent 05/27/2017. CAD, Prior CABG; Risk Factors:Hypertension and Dyslipidemia.  Sonographer:    Jorje Guild BS, RDCS Referring Phys: [phone] MIHAI CROITORU  IMPRESSIONS   1. Left ventricular ejection fraction, by estimation, is 60 to 65%. The left ventricle has normal function. The left ventricle has no regional wall motion abnormalities. Left ventricular diastolic parameters are consistent with Grade I diastolic dysfunction (impaired relaxation). 2. Right ventricular systolic function is mildly reduced. The right ventricular size is normal. There is normal pulmonary artery systolic pressure. The estimated right ventricular systolic pressure is 34.1 mmHg. 3. Left atrial size was mildly dilated. 4. The mitral valve is abnormal. Trivial mitral valve regurgitation. 5. The aortic valve is tricuspid. Aortic valve regurgitation is not visualized. Aortic valve sclerosis/calcification is present, without any evidence of aortic stenosis. 6. The inferior vena cava is normal in size with greater than 50% respiratory variability, suggesting right atrial pressure of 3 mmHg.  Comparison(s): No significant change from prior study. 05/27/17 EF 60-65%. PA pressure .  FINDINGS Left Ventricle: Left ventricular ejection fraction, by estimation, is 60 to 65%. The left ventricle has normal function. The left ventricle has  no regional wall motion abnormalities. The left ventricular internal cavity size was normal in size. There is no left ventricular hypertrophy. Left ventricular diastolic parameters are consistent with Grade I diastolic dysfunction (impaired relaxation). Indeterminate filling pressures.  Right Ventricle: The right ventricular size is normal. No increase in right ventricular wall thickness. Right ventricular systolic function is mildly reduced. There is normal pulmonary artery systolic pressure. The tricuspid regurgitant velocity is 2.79 m/s, and with an assumed right atrial pressure of 3 mmHg, the estimated right ventricular systolic pressure is 34.1 mmHg.  Left Atrium: Left atrial size was mildly dilated.  Right Atrium: Right atrial size was normal in size.  Pericardium: There is no evidence of pericardial effusion.  Mitral Valve: The mitral valve is  abnormal. Mild to moderate mitral annular calcification. Trivial mitral valve regurgitation.  Tricuspid Valve: The tricuspid valve is grossly normal. Tricuspid valve regurgitation is mild.  Aortic Valve: The aortic valve is tricuspid. Aortic valve regurgitation is not visualized. Aortic valve sclerosis/calcification is present, without any evidence of aortic stenosis.  Pulmonic Valve: The pulmonic valve was grossly normal. Pulmonic valve regurgitation is mild.  Aorta: The aortic root and ascending aorta are structurally normal, with no evidence of dilitation.  Venous: The inferior vena cava is normal in size with greater than 50% respiratory variability, suggesting right atrial pressure of 3 mmHg.  IAS/Shunts: No atrial level shunt detected by color flow Doppler.   LEFT VENTRICLE PLAX 2D LVIDd:         3.70 cm   Diastology LVIDs:         2.20 cm   LV e' medial:    0.06 cm/s LV PW:         0.70 cm   LV E/e' medial:  16.2 LV IVS:        0.80 cm   LV e' lateral:   0.11 cm/s LVOT diam:     2.00 cm   LV E/e' lateral: 9.0 LV SV:          66 LV SV Index:   47 LVOT Area:     3.14 cm  3D Volume EF: 3D EF:        59 % LV EDV:       59 ml LV ESV:       24 ml LV SV:        35 ml  RIGHT VENTRICLE            IVC RV Basal diam:  3.10 cm    IVC diam: 1.50 cm RV S prime:     8.22 cm/s TAPSE (M-mode): 1.1 cm  LEFT ATRIUM             Index        RIGHT ATRIUM           Index LA diam:        3.00 cm 2.15 cm/m   RA Area:     13.50 cm LA Vol (A2C):   42.2 ml 30.26 ml/m  RA Volume:   31.90 ml  22.87 ml/m LA Vol (A4C):   47.9 ml 34.35 ml/m LA Biplane Vol: 49.0 ml 35.14 ml/m AORTIC VALVE             PULMONIC VALVE LVOT Vmax:   98.50 cm/s  PR End Diast Vel: 4.58 msec LVOT Vmean:  63.300 cm/s LVOT VTI:    0.209 m  AORTA Ao Root diam: 2.70 cm Ao Asc diam:  3.50 cm  MITRAL VALVE                TRICUSPID VALVE MV Area (PHT): 3.03 cm     TR Peak grad:   31.1 mmHg MV Decel Time: 250 msec     TR Vmax:        279.00 cm/s MV E velocity: 0.98 cm/s MV A velocity: 120.00 cm/s  SHUNTS MV E/A ratio:  0.01         Systemic VTI:  0.21 m Systemic Diam: 2.00 cm  Zoila Shutter MD Electronically signed by Zoila Shutter MD Signature Date/Time: 02/20/2021/4:19:12 PM    Final    MONITORS  CARDIAC EVENT MONITOR 08/26/2016  Narrative  Normal sinus rhythm with very rare PACs  Relatively little increase  in heart rate during the day - likely beta blocker related chronotropic incompetence.  No significant bradycardia is seen  Symptos of "heart flutters" ooccur in association with normal rhythm  Normal arrhythmia monitor. No arrhythmia is associated with the subjective complaints.          Recent Labs: 12/26/2022: ALT 18; B Natriuretic Peptide 186.1; BUN 8; Creatinine, Ser 0.55; Hemoglobin 12.7; Platelets 211; Potassium 3.6; Sodium 137  Recent Lipid Panel    Component Value Date/Time   CHOL 177 09/17/2021 1240   TRIG 59 09/17/2021 1240   HDL 88 09/17/2021 1240   CHOLHDL 2.0 09/17/2021 1240   CHOLHDL 1.9 10/30/2015 1405    VLDL 12 10/30/2015 1405   LDLCALC 78 09/17/2021 1240    History of Present Illness    89 year old female with the above past medical history including CAD s/p CABG x 4 (LIMA-LAD, SVG-OM2, SVG-diagonal, SVG-RCA) in 2013, hypertension, and hyperlipidemia.  Prior cardiac monitor in 08/2016 showed normal sinus rhythm, rare PACs rare relatively little increase in heart rate during the day concerning for beta-blocker induced chronotropic incompetence. ABIs in 2021 showed no evidence of significant PAD. Echocardiogram in 02/2021 showed normal LV function, mild diastolic dysfunction (parameters were medically normal for age), no significant valvular abnormalities.  Nuclear stress test in 09/2021 was low risk, no evidence of ischemia, EF 78%. She was last seen in the office on 08/03/2022 and was stable from a cardiac standpoint.  She noted generalized myalgias/arthralgias.  ESR and CRP were normal.  3.  She was evaluated in the ED on 12/26/2022 in the setting of shortness of breath in the setting of anxiety.  Her symptoms improved with Ativan. BNP was normal, troponin was negative, EKG was stable, chest x-ray was without acute abnormalities.  She was discharged home in stable condition.  She contacted our office on 12/26/2022 for noted ongoing shortness of breath x 2 weeks.  She noted that she caught a cold prior to her symptoms.  She presents today for follow-up. Since her last visit she notes a 3-week history of chest heaviness in the setting of upper respiratory symptoms (cough, nasal congestion), shortness of breath.  She also notes symptoms of chest heaviness and shortness of breath in the setting of significant stress and anxiety.  She denies any exertional symptoms concerning for angina.  Home Medications    Current Outpatient Medications  Medication Sig Dispense Refill   acetaminophen (TYLENOL) 325 MG tablet      amLODipine (NORVASC) 10 MG tablet Take 10 tablets by mouth daily.     aspirin EC 81 MG tablet  Take 1 tablet (81 mg total) by mouth daily. 100 tablet 0   Calcium-Vitamin D-Vitamin K (CALCIUM SOFT CHEWS PO) Take by mouth daily.     Cholecalciferol (VITAMIN D3) 1000 UNITS CAPS Take 2,000 Units by mouth every morning.       diclofenac Sodium (VOLTAREN) 1 % GEL Apply 1 Application topically 2 (two) times daily as needed.     lactulose (CEPHULAC) 10 g packet Take 1 packet (10 g total) by mouth daily. 10 each 0   loratadine (CLARITIN) 10 MG tablet Take 10 mg by mouth daily as needed for allergies.     LORazepam (ATIVAN) 1 MG tablet Take 0.5 tablets (0.5 mg total) by mouth 3 (three) times daily as needed for up to 5 days for anxiety. 7.5 tablet 0   magnesium oxide (MAG-OX) 400 MG tablet Take 400 mg by mouth daily.     metoprolol tartrate (  LOPRESSOR) 25 MG tablet Take 1 tablet (25 mg total) by mouth 2 (two) times daily. 90 tablet 3   omeprazole (PRILOSEC) 20 MG capsule Take 1 capsule (20 mg total) by mouth daily. 14 capsule 0   rosuvastatin (CRESTOR) 10 MG tablet Take 10 mg by mouth at bedtime.     timolol (TIMOPTIC) 0.5 % ophthalmic solution Place 1 drop into both eyes 2 (two) times daily.     Travoprost, BAK Free, (TRAVATAN) 0.004 % SOLN ophthalmic solution Place 1 drop into both eyes at bedtime.     No current facility-administered medications for this visit.     Review of Systems    She denies chest pain, palpitations, dyspnea, pnd, orthopnea, n, v, dizziness, syncope, edema, weight gain, or early satiety. All other systems reviewed and are otherwise negative except as noted above.   Physical Exam    VS:  BP (!) 142/78   Pulse 82   Ht 5' (1.524 m)   Wt 99 lb (44.9 kg)   SpO2 97%   BMI 19.33 kg/m   GEN: Well nourished, well developed, in no acute distress. HEENT: normal. Neck: Supple, no JVD, carotid bruits, or masses. Cardiac: RRR, no murmurs, rubs, or gallops. No clubbing, cyanosis, edema.  Radials/DP/PT 2+ and equal bilaterally.  Respiratory:  Respirations regular and  unlabored, clear to auscultation bilaterally. GI: Soft, nontender, nondistended, BS + x 4. MS: no deformity or atrophy. Skin: warm and dry, no rash. Neuro:  Strength and sensation are intact. Psych: Normal affect.  Accessory Clinical Findings    ECG personally reviewed by me today -    - no EKG in office today.   Lab Results  Component Value Date   WBC 4.8 12/26/2022   HGB 12.7 12/26/2022   HCT 38.0 12/26/2022   MCV 89.2 12/26/2022   PLT 211 12/26/2022   Lab Results  Component Value Date   CREATININE 0.55 12/26/2022   BUN 8 12/26/2022   NA 137 12/26/2022   K 3.6 12/26/2022   CL 98 12/26/2022   CO2 25 12/26/2022   Lab Results  Component Value Date   ALT 18 12/26/2022   AST 28 12/26/2022   ALKPHOS 90 12/26/2022   BILITOT 0.7 12/26/2022   Lab Results  Component Value Date   CHOL 177 09/17/2021   HDL 88 09/17/2021   LDLCALC 78 09/17/2021   TRIG 59 09/17/2021   CHOLHDL 2.0 09/17/2021    Lab Results  Component Value Date   HGBA1C 5.8 (H) 10/06/2011    Assessment & Plan    1. Shortness of breath/CAD: Echo in 02/2021 showed normal LV function, mild diastolic dysfunction (parameters were medically normal for age), no significant valvular abnormalities. Nuclear stress test in 09/2021 was low risk, no evidence of ischemia, EF 78%.  She notes approximately a 3-week history of chest heaviness in the setting of upper respiratory symptoms (cough), shortness of breath.  She has also had symptoms of chest heaviness and shortness of breath in the setting of significant anxiety and stress.  Suspect that stress and possible upper respiratory illness are driving her symptoms at this time.  We discussed possible cardiac testing, however, given prior reassuring workup and association with stress and possible viral symptomology, will defer for now.  Continue to monitor symptoms.  2. Hypertension: BP elevated in office today, well-controlled at home.  Continue to monitor BP and report BP  greater than 140/80.  For now, continue current antihypertensive regimen.   3. Hyperlipidemia: LDL was  78 in 09/2021. Continue Crestor.   4. Disposition: Follow-up in 6 weeks.  HYPERTENSION CONTROL Vitals:   12/29/22 1431 12/29/22 1511  BP: (!) 152/88 (!) 142/78    The patient's blood pressure is elevated above target today.  In order to address the patient's elevated BP: Blood pressure will be monitored at home to determine if medication changes need to be made.      Joylene Grapes, NP 12/29/2022, 5:34 PM

## 2023-02-16 ENCOUNTER — Ambulatory Visit: Payer: 59 | Admitting: Nurse Practitioner

## 2023-02-23 ENCOUNTER — Encounter (HOSPITAL_BASED_OUTPATIENT_CLINIC_OR_DEPARTMENT_OTHER): Payer: Self-pay | Admitting: Urology

## 2023-02-23 ENCOUNTER — Emergency Department (HOSPITAL_BASED_OUTPATIENT_CLINIC_OR_DEPARTMENT_OTHER)
Admission: EM | Admit: 2023-02-23 | Discharge: 2023-02-23 | Disposition: A | Discharge status: Home / Self Care | Payer: 59 | Attending: Emergency Medicine | Admitting: Emergency Medicine

## 2023-02-23 DIAGNOSIS — Z7982 Long term (current) use of aspirin: Secondary | ICD-10-CM | POA: Insufficient documentation

## 2023-02-23 DIAGNOSIS — L299 Pruritus, unspecified: Secondary | ICD-10-CM | POA: Diagnosis not present

## 2023-02-23 DIAGNOSIS — I10 Essential (primary) hypertension: Secondary | ICD-10-CM | POA: Insufficient documentation

## 2023-02-23 DIAGNOSIS — Z79899 Other long term (current) drug therapy: Secondary | ICD-10-CM | POA: Diagnosis not present

## 2023-02-23 DIAGNOSIS — R350 Frequency of micturition: Secondary | ICD-10-CM | POA: Diagnosis present

## 2023-02-23 LAB — URINALYSIS, ROUTINE W REFLEX MICROSCOPIC
Bilirubin Urine: NEGATIVE
Glucose, UA: NEGATIVE mg/dL
Hgb urine dipstick: NEGATIVE
Ketones, ur: 15 mg/dL — AB
Leukocytes,Ua: NEGATIVE
Nitrite: NEGATIVE
Protein, ur: NEGATIVE mg/dL
Specific Gravity, Urine: 1.02 (ref 1.005–1.030)
pH: 7 (ref 5.0–8.0)

## 2023-02-23 LAB — CBC WITH DIFFERENTIAL/PLATELET
Abs Immature Granulocytes: 0.02 10*3/uL (ref 0.00–0.07)
Basophils Absolute: 0 10*3/uL (ref 0.0–0.1)
Basophils Relative: 1 %
Eosinophils Absolute: 0.2 10*3/uL (ref 0.0–0.5)
Eosinophils Relative: 3 %
HCT: 40.3 % (ref 36.0–46.0)
Hemoglobin: 13.2 g/dL (ref 12.0–15.0)
Immature Granulocytes: 0 %
Lymphocytes Relative: 37 %
Lymphs Abs: 2 10*3/uL (ref 0.7–4.0)
MCH: 29.7 pg (ref 26.0–34.0)
MCHC: 32.8 g/dL (ref 30.0–36.0)
MCV: 90.6 fL (ref 80.0–100.0)
Monocytes Absolute: 0.6 10*3/uL (ref 0.1–1.0)
Monocytes Relative: 12 %
Neutro Abs: 2.6 10*3/uL (ref 1.7–7.7)
Neutrophils Relative %: 47 %
Platelets: 231 10*3/uL (ref 150–400)
RBC: 4.45 MIL/uL (ref 3.87–5.11)
RDW: 15.3 % (ref 11.5–15.5)
WBC: 5.4 10*3/uL (ref 4.0–10.5)
nRBC: 0 % (ref 0.0–0.2)

## 2023-02-23 LAB — COMPREHENSIVE METABOLIC PANEL
ALT: 18 U/L (ref 0–44)
AST: 25 U/L (ref 15–41)
Albumin: 4.3 g/dL (ref 3.5–5.0)
Alkaline Phosphatase: 79 U/L (ref 38–126)
Anion gap: 9 (ref 5–15)
BUN: 11 mg/dL (ref 8–23)
CO2: 26 mmol/L (ref 22–32)
Calcium: 9.4 mg/dL (ref 8.9–10.3)
Chloride: 102 mmol/L (ref 98–111)
Creatinine, Ser: 0.46 mg/dL (ref 0.44–1.00)
GFR, Estimated: >60 mL/min (ref >60)
Glucose, Bld: 87 mg/dL (ref 70–99)
Potassium: 3.9 mmol/L (ref 3.5–5.1)
Sodium: 137 mmol/L (ref 135–145)
Total Bilirubin: 0.7 mg/dL (ref 0.0–1.2)
Total Protein: 7.1 g/dL (ref 6.5–8.1)

## 2023-02-23 NOTE — Discharge Instructions (Signed)
 Recommend Benadryl as needed for itchiness.  But I think that if you wore some looser clothing that might help with some irritation in the area as we discussed.

## 2023-02-23 NOTE — ED Triage Notes (Signed)
 Pt states for past 3 months has had increase in urinary frequency at night  Also states itching under left axilla that started on 12/25. Denies any rash, no redness or rash noted

## 2023-02-23 NOTE — ED Provider Notes (Signed)
 [place] [facility] Provider Note   CSN: [id number] Arrival date & time: 02/23/23  1433     History  Chief Complaint  Patient presents with   Urinary Frequency   Pruritis    Theresa Crosby is a 89 y.o. female.  Patient here some urinary frequency.  She is having some itching over her left flank.  She denies any chest pain shortness of breath weakness numbness tingling.  No flank pain.  No abdominal pain no nausea vomit diarrhea.  History of high cholesterol hypertension.  The history is provided by the patient.       Home Medications Prior to Admission medications   Medication Sig Start Date End Date Taking? Authorizing Provider  acetaminophen  (TYLENOL ) 325 MG tablet     [provider]  amLODipine  (NORVASC ) 10 MG tablet Take 10 tablets by mouth daily. 03/21/20   [provider]  aspirin  EC 81 MG tablet Take 1 tablet (81 mg total) by mouth daily. 11/02/11   Wenona Medford SAILOR, MD  Calcium -Vitamin D -Vitamin K (CALCIUM  SOFT CHEWS PO) Take by mouth daily.    [provider]  Cholecalciferol  (VITAMIN D3) 1000 UNITS CAPS Take 2,000 Units by mouth every morning.      [provider]  diclofenac Sodium (VOLTAREN) 1 % GEL Apply 1 Application topically 2 (two) times daily as needed. 11/03/21   [provider]  lactulose  (CEPHULAC ) 10 g packet Take 1 packet (10 g total) by mouth daily. 05/06/22   Catcher Dehoyos, DO  loratadine  (CLARITIN ) 10 MG tablet Take 10 mg by mouth daily as needed for allergies.    [provider]  magnesium  oxide (MAG-OX) 400 MG tablet Take 400 mg by mouth daily.    [provider]  metoprolol  tartrate (LOPRESSOR ) 25 MG tablet Take 1 tablet (25 mg total) by mouth 2 (two) times daily. 06/26/19   Kroeger, Krista M., PA-C  omeprazole  (PRILOSEC) 20 MG capsule Take 1 capsule (20 mg total) by mouth daily. 11/22/20   Doretha Folks, MD  rosuvastatin  (CRESTOR ) 10 MG tablet Take 10  mg by mouth at bedtime. 07/13/21   [provider]  timolol  (TIMOPTIC ) 0.5 % ophthalmic solution Place 1 drop into both eyes 2 (two) times daily. 09/12/21   [provider]  Travoprost, BAK Free, (TRAVATAN) 0.004 % SOLN ophthalmic solution Place 1 drop into both eyes at bedtime. 07/30/21   [provider]      Allergies    Aspirin , Lactose intolerance (gi), and Brimonidine     Review of Systems   Review of Systems  Physical Exam Updated Vital Signs BP (!) 174/89 (BP Location: Left Arm)   Pulse 86   Temp 97.7 F (36.5 C)   Resp 20   Ht 5' (1.524 m)   Wt 44.9 kg   SpO2 99%   BMI 19.33 kg/m  Physical Exam Vitals and nursing note reviewed.  Constitutional:      General: She is not in acute distress.    Appearance: She is well-developed. She is not ill-appearing.  HENT:     Head: Normocephalic and atraumatic.     Nose: Nose normal.     Mouth/Throat:     Mouth: Mucous membranes are moist.  Eyes:     Extraocular Movements: Extraocular movements intact.     Conjunctiva/sclera: Conjunctivae normal.     Pupils: Pupils are equal, round, and reactive to light.  Cardiovascular:     Rate and Rhythm: Normal rate  and regular rhythm.     Pulses: Normal pulses.     Heart sounds: Normal heart sounds. No murmur heard. Pulmonary:     Effort: Pulmonary effort is normal. No respiratory distress.     Breath sounds: Normal breath sounds.  Abdominal:     Palpations: Abdomen is soft.     Tenderness: There is no abdominal tenderness.  Musculoskeletal:        General: No swelling.     Cervical back: Neck supple.  Skin:    General: Skin is warm and dry.     Capillary Refill: Capillary refill takes less than 2 seconds.     Findings: No rash.  Neurological:     General: No focal deficit present.     Mental Status: She is alert.  Psychiatric:        Mood and Affect: Mood normal.     ED Results / Procedures / Treatments   Labs (all labs ordered are listed, but  only abnormal results are displayed) Labs Reviewed  URINALYSIS, ROUTINE W REFLEX MICROSCOPIC - Abnormal; Notable for the following components:      Result Value   Ketones, ur 15 (*)    All other components within normal limits  CBC WITH DIFFERENTIAL/PLATELET  COMPREHENSIVE METABOLIC PANEL    EKG None  Radiology No results found.  Procedures Procedures    Medications Ordered in ED Medications - No data to display  ED Course/ Medical Decision Making/ A&P                                 Medical Decision Making Amount and/or Complexity of Data Reviewed Labs: ordered.   Theresa Crosby is here with urinary frequency.  History of hypertension high cholesterol.  Urinalysis negative for infection.  Basic labs unremarkable.  No significant anemia electrolyte abnormality kidney injury or leukocytosis.  I get a CBC CMP.  I reviewed interpreted labs.  Differential diagnosis likely was UTI versus irritation.  Overall I have no concern for kidney stone or other acute process.  She has no diabetes.  She has been itching over the left flank but I think that is likely secondary to her clothing.  She is wearing very tight close and she is wearing depends that I think are irritating her.  There is no rash.  Do not think she has shingles.  She can use Benadryl as needed but I suspect that she could wear looser clothing that might help.  Overall she is well-appearing.  Have no concern for emergent process otherwise.  Discharge.  This chart was dictated using voice recognition software.  Despite best efforts to proofread,  errors can occur which can change the documentation meaning.         Final Clinical Impression(s) / ED Diagnoses Final diagnoses:  Urinary frequency  Pruritus    Rx / DC Orders ED Discharge Orders     None         Ruthe Cornet, DO 02/23/23 2337

## 2023-02-23 NOTE — ED Notes (Signed)
 Verified pts name prior to discharge. Reviewed discharge instructions and medications with pt. Pt states understanding. Assisted to lobby

## 2023-04-13 ENCOUNTER — Ambulatory Visit: Payer: 59 | Admitting: Nurse Practitioner
